# Patient Record
Sex: Male | Born: 1935 | Race: White | Hispanic: No | Marital: Married | Smoking: Former smoker
Health system: Southern US, Community
[De-identification: ages and names within clinical notes are randomized; demographics above are authoritative.]

## PROBLEM LIST (undated history)

## (undated) DIAGNOSIS — I639 Cerebral infarction, unspecified: Secondary | ICD-10-CM

## (undated) DIAGNOSIS — I5022 Chronic systolic (congestive) heart failure: Secondary | ICD-10-CM

## (undated) DIAGNOSIS — I2589 Other forms of chronic ischemic heart disease: Secondary | ICD-10-CM

## (undated) DIAGNOSIS — F17200 Nicotine dependence, unspecified, uncomplicated: Secondary | ICD-10-CM

## (undated) DIAGNOSIS — I6350 Cerebral infarction due to unspecified occlusion or stenosis of unspecified cerebral artery: Secondary | ICD-10-CM

## (undated) DIAGNOSIS — K219 Gastro-esophageal reflux disease without esophagitis: Secondary | ICD-10-CM

## (undated) DIAGNOSIS — I209 Angina pectoris, unspecified: Secondary | ICD-10-CM

## (undated) DIAGNOSIS — I2510 Atherosclerotic heart disease of native coronary artery without angina pectoris: Secondary | ICD-10-CM

## (undated) DIAGNOSIS — R0602 Shortness of breath: Secondary | ICD-10-CM

## (undated) DIAGNOSIS — F419 Anxiety disorder, unspecified: Secondary | ICD-10-CM

## (undated) DIAGNOSIS — J189 Pneumonia, unspecified organism: Secondary | ICD-10-CM

## (undated) DIAGNOSIS — I252 Old myocardial infarction: Secondary | ICD-10-CM

## (undated) DIAGNOSIS — I495 Sick sinus syndrome: Secondary | ICD-10-CM

## (undated) DIAGNOSIS — R011 Cardiac murmur, unspecified: Secondary | ICD-10-CM

## (undated) DIAGNOSIS — E785 Hyperlipidemia, unspecified: Secondary | ICD-10-CM

## (undated) DIAGNOSIS — D649 Anemia, unspecified: Secondary | ICD-10-CM

## (undated) DIAGNOSIS — I4891 Unspecified atrial fibrillation: Secondary | ICD-10-CM

## (undated) DIAGNOSIS — I447 Left bundle-branch block, unspecified: Secondary | ICD-10-CM

## (undated) DIAGNOSIS — R569 Unspecified convulsions: Secondary | ICD-10-CM

## (undated) DIAGNOSIS — J449 Chronic obstructive pulmonary disease, unspecified: Secondary | ICD-10-CM

## (undated) DIAGNOSIS — Z95810 Presence of automatic (implantable) cardiac defibrillator: Secondary | ICD-10-CM

## (undated) HISTORY — DX: Other forms of chronic ischemic heart disease: I25.89

## (undated) HISTORY — PX: CORONARY ARTERY BYPASS GRAFT: SHX141

## (undated) HISTORY — DX: Hyperlipidemia, unspecified: E78.5

## (undated) HISTORY — DX: Left bundle-branch block, unspecified: I44.7

## (undated) HISTORY — DX: Cerebral infarction due to unspecified occlusion or stenosis of unspecified cerebral artery: I63.50

## (undated) HISTORY — DX: Old myocardial infarction: I25.2

## (undated) HISTORY — DX: Gastro-esophageal reflux disease without esophagitis: K21.9

## (undated) HISTORY — DX: Sick sinus syndrome: I49.5

## (undated) HISTORY — DX: Nicotine dependence, unspecified, uncomplicated: F17.200

---

## 1996-10-11 HISTORY — PX: CARDIAC CATHETERIZATION: SHX172

## 1997-09-23 ENCOUNTER — Ambulatory Visit (HOSPITAL_COMMUNITY): Admission: RE | Admit: 1997-09-23 | Discharge: 1997-09-23 | Payer: Self-pay | Admitting: Family Medicine

## 1997-09-25 ENCOUNTER — Encounter: Payer: Self-pay | Admitting: Family Medicine

## 1997-09-25 ENCOUNTER — Ambulatory Visit (HOSPITAL_COMMUNITY): Admission: RE | Admit: 1997-09-25 | Discharge: 1997-09-25 | Payer: Self-pay | Admitting: Family Medicine

## 1997-09-26 ENCOUNTER — Encounter: Payer: Self-pay | Admitting: Cardiovascular Disease

## 1997-09-26 ENCOUNTER — Inpatient Hospital Stay (HOSPITAL_COMMUNITY): Admission: AD | Admit: 1997-09-26 | Discharge: 1997-10-01 | Payer: Self-pay | Admitting: Cardiovascular Disease

## 1999-02-25 ENCOUNTER — Ambulatory Visit (HOSPITAL_COMMUNITY): Admission: RE | Admit: 1999-02-25 | Discharge: 1999-02-25 | Payer: Self-pay | Admitting: Gastroenterology

## 2001-09-14 ENCOUNTER — Encounter: Payer: Self-pay | Admitting: Emergency Medicine

## 2001-09-14 ENCOUNTER — Emergency Department (HOSPITAL_COMMUNITY): Admission: EM | Admit: 2001-09-14 | Discharge: 2001-09-15 | Payer: Self-pay | Admitting: Emergency Medicine

## 2002-01-20 ENCOUNTER — Emergency Department (HOSPITAL_COMMUNITY): Admission: EM | Admit: 2002-01-20 | Discharge: 2002-01-21 | Payer: Self-pay | Admitting: Emergency Medicine

## 2002-01-21 ENCOUNTER — Encounter: Payer: Self-pay | Admitting: *Deleted

## 2002-01-22 ENCOUNTER — Encounter: Payer: Self-pay | Admitting: Orthopedic Surgery

## 2002-01-22 ENCOUNTER — Ambulatory Visit (HOSPITAL_COMMUNITY): Admission: RE | Admit: 2002-01-22 | Discharge: 2002-01-22 | Payer: Self-pay | Admitting: Orthopedic Surgery

## 2003-12-23 ENCOUNTER — Ambulatory Visit: Payer: Self-pay | Admitting: Family Medicine

## 2004-01-21 ENCOUNTER — Ambulatory Visit: Payer: Self-pay | Admitting: Family Medicine

## 2004-03-12 ENCOUNTER — Emergency Department (HOSPITAL_COMMUNITY): Admission: EM | Admit: 2004-03-12 | Discharge: 2004-03-12 | Payer: Self-pay | Admitting: *Deleted

## 2004-03-12 ENCOUNTER — Ambulatory Visit: Payer: Self-pay | Admitting: Family Medicine

## 2004-03-16 ENCOUNTER — Ambulatory Visit: Payer: Self-pay | Admitting: Family Medicine

## 2004-04-13 ENCOUNTER — Ambulatory Visit: Payer: Self-pay | Admitting: Family Medicine

## 2004-07-20 ENCOUNTER — Ambulatory Visit: Payer: Self-pay | Admitting: Family Medicine

## 2004-10-27 ENCOUNTER — Ambulatory Visit: Payer: Self-pay | Admitting: Family Medicine

## 2004-11-24 ENCOUNTER — Ambulatory Visit: Payer: Self-pay | Admitting: Family Medicine

## 2004-12-18 ENCOUNTER — Ambulatory Visit: Payer: Self-pay | Admitting: Family Medicine

## 2005-03-29 ENCOUNTER — Ambulatory Visit: Payer: Self-pay | Admitting: Family Medicine

## 2005-05-03 ENCOUNTER — Ambulatory Visit: Payer: Self-pay | Admitting: Family Medicine

## 2005-05-03 ENCOUNTER — Inpatient Hospital Stay (HOSPITAL_COMMUNITY): Admission: EM | Admit: 2005-05-03 | Discharge: 2005-05-05 | Payer: Self-pay | Admitting: Emergency Medicine

## 2005-05-10 ENCOUNTER — Ambulatory Visit: Payer: Self-pay | Admitting: Family Medicine

## 2005-05-31 ENCOUNTER — Ambulatory Visit: Payer: Self-pay | Admitting: Family Medicine

## 2005-06-01 HISTORY — PX: CARDIOVASCULAR STRESS TEST: SHX262

## 2005-07-19 ENCOUNTER — Ambulatory Visit: Payer: Self-pay | Admitting: Family Medicine

## 2005-09-07 ENCOUNTER — Ambulatory Visit: Payer: Self-pay | Admitting: Family Medicine

## 2005-10-27 ENCOUNTER — Ambulatory Visit: Payer: Self-pay | Admitting: Family Medicine

## 2005-11-26 ENCOUNTER — Ambulatory Visit: Payer: Self-pay | Admitting: Family Medicine

## 2006-01-13 ENCOUNTER — Ambulatory Visit: Payer: Self-pay | Admitting: Family Medicine

## 2006-01-25 HISTORY — PX: CARDIAC DEFIBRILLATOR PLACEMENT: SHX171

## 2006-01-28 ENCOUNTER — Encounter (INDEPENDENT_AMBULATORY_CARE_PROVIDER_SITE_OTHER): Payer: Self-pay | Admitting: Cardiology

## 2006-01-28 ENCOUNTER — Inpatient Hospital Stay (HOSPITAL_COMMUNITY): Admission: EM | Admit: 2006-01-28 | Discharge: 2006-01-30 | Payer: Self-pay | Admitting: Emergency Medicine

## 2006-02-07 ENCOUNTER — Ambulatory Visit: Payer: Self-pay | Admitting: Family Medicine

## 2006-02-17 ENCOUNTER — Ambulatory Visit: Payer: Self-pay | Admitting: Family Medicine

## 2006-03-18 ENCOUNTER — Ambulatory Visit: Payer: Self-pay | Admitting: Family Medicine

## 2006-03-23 ENCOUNTER — Ambulatory Visit: Payer: Self-pay | Admitting: Internal Medicine

## 2006-03-24 HISTORY — PX: US ECHOCARDIOGRAPHY: HXRAD669

## 2006-03-28 ENCOUNTER — Ambulatory Visit: Payer: Self-pay | Admitting: Family Medicine

## 2006-04-07 ENCOUNTER — Ambulatory Visit: Payer: Self-pay | Admitting: Internal Medicine

## 2006-04-07 LAB — CONVERTED CEMR LAB
BUN: 21 mg/dL (ref 6–23)
Chloride: 101 meq/L (ref 96–112)
GFR calc Af Amer: 95 mL/min
GFR calc non Af Amer: 78 mL/min
Glucose, Bld: 76 mg/dL (ref 70–99)
HCT: 38.3 % — ABNORMAL LOW (ref 39.0–52.0)
INR: 3.2 — ABNORMAL HIGH (ref 0.9–2.0)
Lymphocytes Relative: 28.9 % (ref 12.0–46.0)
MCV: 81.8 fL (ref 78.0–100.0)
Monocytes Relative: 9.9 % (ref 3.0–11.0)
Neutrophils Relative %: 57.6 % (ref 43.0–77.0)
Potassium: 4 meq/L (ref 3.5–5.1)
Prothrombin Time: 23.1 s — ABNORMAL HIGH (ref 10.0–14.0)
RBC: 4.68 M/uL (ref 4.22–5.81)
RDW: 29.8 % — ABNORMAL HIGH (ref 11.5–14.6)
Sodium: 140 meq/L (ref 135–145)

## 2006-04-14 ENCOUNTER — Inpatient Hospital Stay (HOSPITAL_COMMUNITY): Admission: AD | Admit: 2006-04-14 | Discharge: 2006-04-15 | Payer: Self-pay | Admitting: Internal Medicine

## 2006-04-14 ENCOUNTER — Ambulatory Visit: Payer: Self-pay | Admitting: Internal Medicine

## 2006-04-28 ENCOUNTER — Ambulatory Visit: Payer: Self-pay | Admitting: Family Medicine

## 2006-05-02 ENCOUNTER — Ambulatory Visit: Payer: Self-pay

## 2006-06-02 ENCOUNTER — Ambulatory Visit: Payer: Self-pay | Admitting: Internal Medicine

## 2006-08-02 ENCOUNTER — Ambulatory Visit: Payer: Self-pay | Admitting: Internal Medicine

## 2006-09-05 ENCOUNTER — Ambulatory Visit: Payer: Self-pay | Admitting: Internal Medicine

## 2006-10-28 ENCOUNTER — Inpatient Hospital Stay (HOSPITAL_COMMUNITY): Admission: EM | Admit: 2006-10-28 | Discharge: 2006-10-31 | Payer: Self-pay | Admitting: Emergency Medicine

## 2006-10-28 ENCOUNTER — Ambulatory Visit: Payer: Self-pay | Admitting: Internal Medicine

## 2006-10-31 ENCOUNTER — Encounter (INDEPENDENT_AMBULATORY_CARE_PROVIDER_SITE_OTHER): Payer: Self-pay | Admitting: Internal Medicine

## 2006-10-31 ENCOUNTER — Ambulatory Visit: Payer: Self-pay | Admitting: Vascular Surgery

## 2007-04-18 ENCOUNTER — Ambulatory Visit: Payer: Self-pay | Admitting: Internal Medicine

## 2007-04-25 ENCOUNTER — Ambulatory Visit: Payer: Self-pay | Admitting: Vascular Surgery

## 2007-07-18 ENCOUNTER — Ambulatory Visit: Payer: Self-pay | Admitting: Internal Medicine

## 2007-10-18 ENCOUNTER — Ambulatory Visit: Payer: Self-pay | Admitting: Internal Medicine

## 2008-01-16 ENCOUNTER — Ambulatory Visit: Payer: Self-pay | Admitting: Internal Medicine

## 2008-04-10 ENCOUNTER — Encounter: Payer: Self-pay | Admitting: Internal Medicine

## 2008-04-15 DIAGNOSIS — I495 Sick sinus syndrome: Secondary | ICD-10-CM

## 2008-04-15 DIAGNOSIS — I6350 Cerebral infarction due to unspecified occlusion or stenosis of unspecified cerebral artery: Secondary | ICD-10-CM

## 2008-04-15 DIAGNOSIS — I2510 Atherosclerotic heart disease of native coronary artery without angina pectoris: Secondary | ICD-10-CM | POA: Insufficient documentation

## 2008-04-15 DIAGNOSIS — F17200 Nicotine dependence, unspecified, uncomplicated: Secondary | ICD-10-CM | POA: Insufficient documentation

## 2008-04-15 DIAGNOSIS — R569 Unspecified convulsions: Secondary | ICD-10-CM | POA: Insufficient documentation

## 2008-04-15 DIAGNOSIS — R5381 Other malaise: Secondary | ICD-10-CM

## 2008-04-15 DIAGNOSIS — R5383 Other fatigue: Secondary | ICD-10-CM

## 2008-04-15 DIAGNOSIS — I5023 Acute on chronic systolic (congestive) heart failure: Secondary | ICD-10-CM | POA: Insufficient documentation

## 2008-04-15 DIAGNOSIS — K219 Gastro-esophageal reflux disease without esophagitis: Secondary | ICD-10-CM

## 2008-04-15 DIAGNOSIS — E785 Hyperlipidemia, unspecified: Secondary | ICD-10-CM | POA: Insufficient documentation

## 2008-04-15 DIAGNOSIS — I2589 Other forms of chronic ischemic heart disease: Secondary | ICD-10-CM | POA: Insufficient documentation

## 2008-04-15 HISTORY — DX: Cerebral infarction due to unspecified occlusion or stenosis of unspecified cerebral artery: I63.50

## 2008-04-16 ENCOUNTER — Encounter: Payer: Self-pay | Admitting: Internal Medicine

## 2008-04-16 ENCOUNTER — Ambulatory Visit: Payer: Self-pay | Admitting: Internal Medicine

## 2008-04-22 ENCOUNTER — Ambulatory Visit: Payer: Self-pay

## 2008-07-23 ENCOUNTER — Ambulatory Visit: Payer: Self-pay | Admitting: Internal Medicine

## 2008-07-29 ENCOUNTER — Encounter: Payer: Self-pay | Admitting: Internal Medicine

## 2008-10-10 ENCOUNTER — Emergency Department (HOSPITAL_COMMUNITY): Admission: EM | Admit: 2008-10-10 | Discharge: 2008-10-11 | Payer: Self-pay | Admitting: Emergency Medicine

## 2008-10-24 ENCOUNTER — Encounter: Payer: Self-pay | Admitting: Internal Medicine

## 2008-10-25 ENCOUNTER — Encounter: Payer: Self-pay | Admitting: Internal Medicine

## 2008-10-31 ENCOUNTER — Encounter: Payer: Self-pay | Admitting: Internal Medicine

## 2009-01-28 ENCOUNTER — Ambulatory Visit: Payer: Self-pay | Admitting: Internal Medicine

## 2009-02-13 ENCOUNTER — Encounter: Payer: Self-pay | Admitting: Internal Medicine

## 2009-03-31 ENCOUNTER — Ambulatory Visit: Payer: Self-pay | Admitting: Internal Medicine

## 2009-03-31 DIAGNOSIS — Z95810 Presence of automatic (implantable) cardiac defibrillator: Secondary | ICD-10-CM | POA: Insufficient documentation

## 2009-07-24 ENCOUNTER — Ambulatory Visit: Payer: Self-pay | Admitting: Internal Medicine

## 2009-08-05 ENCOUNTER — Encounter: Payer: Self-pay | Admitting: Internal Medicine

## 2009-10-30 ENCOUNTER — Encounter: Payer: Self-pay | Admitting: Internal Medicine

## 2009-11-03 ENCOUNTER — Ambulatory Visit: Payer: Self-pay | Admitting: Internal Medicine

## 2009-11-12 ENCOUNTER — Encounter: Payer: Self-pay | Admitting: Internal Medicine

## 2010-02-05 ENCOUNTER — Ambulatory Visit: Admit: 2010-02-05 | Payer: Self-pay | Admitting: Internal Medicine

## 2010-02-06 ENCOUNTER — Encounter (INDEPENDENT_AMBULATORY_CARE_PROVIDER_SITE_OTHER): Payer: Self-pay | Admitting: *Deleted

## 2010-02-26 NOTE — Letter (Signed)
Summary: Remote Device Check  [employer], Main Office  [address]   [place], [place] [address]   Phone: [phone]  Fax: [phone]     August 05, 2009 MRN: [id number]   [name] 2005 [address] [place], [place]  [address]   Dear Mr. Lanius,   Your remote transmission was recieved and reviewed by your physician.  All diagnostics were within normal limits for you.  __X___Your next transmission is scheduled for:  10-23-2009.  Please transmit at any time this day.  If you have a wireless device your transmission will be sent automatically.    Sincerely,  Vella Kohler

## 2010-02-26 NOTE — Letter (Signed)
Summary: Device-Delinquent Phone Journalist, newspaper, Main Office  [address]   [place], [place] [address]   Phone: [phone]  Fax: [phone]     October 30, 2009 MRN: [id number]   [name] 2005 [address] [place], [place]  [address]   Dear Mr. Goldblatt,  According to our records, you were scheduled for a device phone transmission on 10-23-09.     We did not receive any results from this check.  If you transmitted on your scheduled day, please call us to help troubleshoot your system.  If you forgot to send your transmission, please send one upon receipt of this letter.  Thank you,   Architectural [facility]

## 2010-02-26 NOTE — Letter (Signed)
Summary: Remote Device Check  [employer], Main Office  [address]   [place], [place] [address]   Phone: [phone]  Fax: [phone]     November 12, 2009 MRN: [id number]   [name] 2005 [address] [place], [place]  [address]   Dear [name],   Your remote transmission was recieved and reviewed by your physician.  All diagnostics were within normal limits for you.  __X___Your next transmission is scheduled for:  02-05-2010.  Please transmit at any time this day.  If you have a wireless device your transmission will be sent automatically.   Sincerely,  Vella Kohler

## 2010-02-26 NOTE — Letter (Signed)
Summary: Device-Delinquent Phone Journalist, newspaper, Main Office  [address]   [place], [place] [address]   Phone: [phone]  Fax: [phone]     February 06, 2010 MRN: [id number]   [name] 2005 [address] [place], [place]  [address]   Dear Mr. Rolfson,  According to our records, you were scheduled for a device phone transmission on 02-05-2010.     We did not receive any results from this check.  If you transmitted on your scheduled day, please call us to help troubleshoot your system.  If you forgot to send your transmission, please send one upon receipt of this letter.  Thank you,   Architectural [facility]

## 2010-02-26 NOTE — Cardiovascular Report (Signed)
Summary: Office Visit Remote   Office Visit Remote   Imported By: Roderic Ovens 02/19/2009 12:24:53  _____________________________________________________________________  External Attachment:    Type:   Image     Comment:   External Document

## 2010-02-26 NOTE — Assessment & Plan Note (Signed)
Summary: 1 YR F/U   Visit Type:  Follow-up Primary Provider:  Lysbeth Galas, MD  CC:  headache.  History of Present Illness: Mr. Diebold returns today for followup.  He is a 77 yo man with a h/o an ICM, CHF, HTN, and peripheral vascular disease.  He has tried to stop smoking and notes that he can get down to a half pack a day but becomes anxious and increases his consumption.  He underwent implantation of a Medtronic DDD ICD approximately 3 yrs ago and has had no intercurrent ICD therapies.  No c/p, peripheral edema, or nausea and vomiting.  Problems Prior to Update: 1)  Tobacco User  (ICD-305.1) 2)  Gerd  (ICD-530.81) 3)  Cva  (ICD-434.91) 4)  Dyslipidemia  (ICD-272.4) 5)  Seizure Disorder  (ICD-780.39) 6)  Coronary Artery Bypass Graft, Hx of  (ICD-V45.81) 7)  Weakness  (ICD-780.79) 8)  Fatigue / Malaise  (ICD-780.79) 9)  Pacemaker  (ICD-V45.Marland Kitchen01) 10)  Sinus Bradycardia  (ICD-427.81) 11)  Congestive Heart Failure, Unspecified  (ICD-428.0) 12)  Cardiomyopathy, Ischemic  (ICD-414.8)  Current Medications (verified): 1)  Furosemide 40 Mg Tabs (Furosemide) .... Take One Tablet By Mouth Two Times A Day 2)  Phenytoin Sodium Extended 100 Mg Caps (Phenytoin Sodium Extended) .Marland Kitchen.. 1 Tab Morning, 1 Tab Noon, 2 Tabs Night 3)  Phenobarbital 97.2 Mg Tabs (Phenobarbital) .... Take 1/2 in Am and 1 in Pm 4)  Warfarin 5mg  .... As Directed 5)  Enalapril Maleate 2.5 Mg Tabs (Enalapril Maleate) .... Take One Tablet By Mouth Once Daily 6)  Digoxin 0.25 Mg Tabs (Digoxin) .... Take One Tablet By Mouth Daily 7)  Potassium Chloride Cr 10 Meq Cr-Caps (Potassium Chloride) .... Take Two Tablet By Mouth Daily 8)  Carvedilol 12.5 Mg Tabs (Carvedilol) .... Take One Tablet By Mouth Twice A Day 9)  Aspirin Ec 325 Mg Tbec (Aspirin) .... Take One Tablet By Mouth Daily 10)  Prilosec 20 Mg Cpdr (Omeprazole) .... Take One Tablet By Mouth Once Daily. 11)  Simvastatin 80 Mg Tabs (Simvastatin) .... Take One Tablet By Mouth Daily  At Bedtime 12)  Ferrous Sulfate .... Take One Tablet By Mouth Once Daily.  Allergies: 1)  ! Diovan  Past History:  Past Medical History: Last updated: 04/15/2008  TOBACCO USER (ICD-305.1) GERD (ICD-530.81) CVA (ICD-434.91) DYSLIPIDEMIA (ICD-272.4) SEIZURE DISORDER (ICD-780.39) CORONARY ARTERY BYPASS GRAFT, HX OF (ICD-V45.81) WEAKNESS (ICD-780.79) FATIGUE / MALAISE (ICD-780.79) PACEMAKER (ICD-V45.Marland Kitchen01) SINUS BRADYCARDIA (ICD-427.81) CONGESTIVE HEART FAILURE, UNSPECIFIED (ICD-428.0) CARDIOMYOPATHY, ISCHEMIC (ICD-414.8)  Review of Systems  The patient denies chest pain, syncope, dyspnea on exertion, and peripheral edema.    Vital Signs:  Patient profile:   77 year old male Height:      69 inches Weight:      158 pounds BMI:     23.42 O2 Sat:      97 % Pulse rate:   70 / minute BP sitting:   112 / 60  (left arm)  Vitals Entered By: Laurance Flatten CMA (March 31, 2009 3:42 PM)  Physical Exam  General:  Elderly, well developed, well nourished, in no acute distress.  HEENT: normal Neck: supple. No JVD. Carotids 1+ bilaterally no bruits Cor: RRR no rubs, gallops or murmur. PMI is enlarged and laterally displaced. Lungs: CTA Ab: soft, nontender. nondistended. No HSM. Good bowel sounds Ext: warm. no cyanosis, clubbing or edema Neuro: alert and oriented. Grossly nonfocal. affect pleasant     ICD Specifications Following MD:  Lewayne Bunting, MD  ICD Vendor:  Medtronic     ICD Model Number:  7278     ICD Serial Number:  [id number] H ICD DOI:  04/14/2006      Lead 1:    Location: RA     DOI: 04/14/2006     Model #: 2025     Serial #: [id number]     Status: active Lead 2:    Location: RV     DOI: 04/14/2006     Model #: 8315     Serial #: [id number]     Status: active  Indications::  ICM  Explantation Comments: [facility]  ICD Follow Up Remote Check?  No Battery Voltage:  3.07 V     Charge Time:  8.79 seconds     Underlying rhythm:  SB ICD Dependent:  No       ICD Device  Measurements Atrium:  Amplitude: 2.3 mV, Impedance: 480 ohms, Threshold: 1.0 V at 0.4 msec Right Ventricle:  Amplitude: 16.3 mV, Impedance: 744 ohms, Threshold: 1.0 V at 0.4 msec Shock Impedance: 46/60 ohms   Episodes MS Episodes:  41     Coumadin:  Yes Shock:  0     ATP:  0     Nonsustained:  0     Atrial Pacing:  92.3%     Ventricular Pacing:  14.3%  Brady Parameters Mode DDDR     Lower Rate Limit:  70     Upper Rate Limit 120 PAV 350     Sensed AV Delay:  300  Tachy Zones VF:  200     VT:  250 FVT VIA VF     VT1:  171     Next Remote Date:  07/01/2009     Next Cardiology Appt Due:  03/26/2010 Tech Comments:  Normal device function.  No changes made today.  Pt on Coumadin therapy.  Pt does [facility] transmissions.  ROV 12 months GT. Gypsy Balsam RN BSN  March 31, 2009 4:02 PM  MD Comments:  Agree with above.  Impression & Recommendations:  Problem # 1:  CARDIOMYOPATHY, ISCHEMIC (ICD-414.8) He denies anginal symptoms.  Continue meds as noted below. His updated medication list for this problem includes:    Furosemide 40 Mg Tabs (Furosemide) .Marland Kitchen... Take one tablet by mouth two times a day    Enalapril Maleate 2.5 Mg Tabs (Enalapril maleate) .Marland Kitchen... Take one tablet by mouth once daily    Digoxin 0.25 Mg Tabs (Digoxin) .Marland Kitchen... Take one tablet by mouth daily    Carvedilol 12.5 Mg Tabs (Carvedilol) .Marland Kitchen... Take one tablet by mouth twice a day    Aspirin Ec 325 Mg Tbec (Aspirin) .Marland Kitchen... Take one tablet by mouth daily  Problem # 2:  AUTOMATIC IMPLANTABLE CARDIAC DEFIBRILLATOR SITU (ICD-V45.02) His device is working normally.  No intercurrent ICD therapies.  Problem # 3:  TOBACCO USER (ICD-305.1) I continue to attempt to encourage him to stop smoking. He is going to try.  Patient Instructions: 1)  Your physician recommends that you schedule a follow-up appointment in: 12 months with Dr [name]

## 2010-02-26 NOTE — Cardiovascular Report (Signed)
Summary: Office Visit Remote   Office Visit Remote   Imported By: Roderic Ovens 08/06/2009 12:08:52  _____________________________________________________________________  External Attachment:    Type:   Image     Comment:   External Document

## 2010-02-26 NOTE — Letter (Signed)
Summary: Remote Device Check  [employer], Main Office  [address]   [place], [place] [address]   Phone: [phone]  Fax: [phone]     February 13, 2009 MRN: [id number]   ROPER TOLSON 2005 [address] [place], [place]  [address]   Dear Mr. Brine,   Your remote transmission was recieved and reviewed by your physician.  All diagnostics were within normal limits for you.    ___X___Your next office visit is scheduled for:   MARCH 2011 WITH DR [name]. Please call our office to schedule an appointment.    Sincerely,  Proofreader

## 2010-02-26 NOTE — Cardiovascular Report (Signed)
Summary: Office Visit Remote   Office Visit Remote   Imported By: Roderic Ovens 11/13/2009 13:29:01  _____________________________________________________________________  External Attachment:    Type:   Image     Comment:   External Document

## 2010-04-07 ENCOUNTER — Encounter (INDEPENDENT_AMBULATORY_CARE_PROVIDER_SITE_OTHER): Payer: Medicare PPO | Admitting: Internal Medicine

## 2010-04-07 ENCOUNTER — Encounter: Payer: Self-pay | Admitting: Internal Medicine

## 2010-04-07 DIAGNOSIS — I5022 Chronic systolic (congestive) heart failure: Secondary | ICD-10-CM

## 2010-04-07 DIAGNOSIS — Z95810 Presence of automatic (implantable) cardiac defibrillator: Secondary | ICD-10-CM

## 2010-04-07 DIAGNOSIS — I2589 Other forms of chronic ischemic heart disease: Secondary | ICD-10-CM

## 2010-04-07 DIAGNOSIS — F17200 Nicotine dependence, unspecified, uncomplicated: Secondary | ICD-10-CM

## 2010-04-07 NOTE — Assessment & Plan Note (Signed)
He denies anginal symptoms. Will continue his current meds.  

## 2010-04-07 NOTE — Assessment & Plan Note (Signed)
I have discussed the importance of stopping smoking and have asked him to try a low dose of nicotine replacement and to reduce his tobacco usage to less than a half pack a day.

## 2010-04-07 NOTE — Assessment & Plan Note (Signed)
His device is working normally. Will recheck in several months. 

## 2010-04-07 NOTE — Progress Notes (Signed)
HPI Mr. Pounders returns today for followup. He is a 77 yo man with a h/o an ICM, CHF, HTN, and peripheral vascular disease. He has tried to stop smoking and notes that he can get down to a half pack a day but becomes anxious and increases his consumption. He underwent implantation of a Medtronic DDD ICD approximately 4 yrs ago and has had no intercurrent ICD therapies. No c/p, peripheral edema, or nausea and vomiting.  Allergies  Allergen Reactions  . Valsartan      Current Outpatient Prescriptions  Medication Sig Dispense Refill  . aspirin 325 MG tablet Take 325 mg by mouth daily.        . carvedilol (COREG) 12.5 MG tablet Take 12.5 mg by mouth 2 (two) times daily with a meal.        . digoxin (LANOXIN) 0.25 MG tablet Take 250 mcg by mouth daily.        . enalapril (VASOTEC) 2.5 MG tablet Take 2.5 mg by mouth daily.        . furosemide (LASIX) 40 MG tablet Take 40 mg by mouth 2 (two) times daily.        Marland Kitchen omeprazole (PRILOSEC OTC) 20 MG tablet Take 20 mg by mouth daily.        Marland Kitchen PHENobarbital (LUMINAL) 97.2 MG tablet Take 97.2 mg by mouth as directed. 1/2 tab in AM 1 tab in PM       . phenytoin (DILANTIN) 100 MG ER capsule Take by mouth as directed. 1 tab in morning 1 tab at noon 2 tabs at night       . potassium chloride (KLOR-CON) 10 MEQ CR tablet Take 10 mEq by mouth daily.        . simvastatin (ZOCOR) 80 MG tablet Take 80 mg by mouth at bedtime.        Marland Kitchen warfarin (COUMADIN) 5 MG tablet Take 5 mg by mouth as directed.           Past Medical History  Diagnosis Date  . Tobacco use disorder   . Esophageal reflux   . Unspecified cerebral artery occlusion with cerebral infarction   . Other and unspecified hyperlipidemia   . Other convulsions   . Postsurgical aortocoronary bypass status   . Other malaise and fatigue   . Cardiac pacemaker in situ   . Sinoatrial node dysfunction   . Congestive heart failure, unspecified   . Other specified forms of chronic ischemic heart disease       History reviewed. No pertinent past surgical history.   Family History  Problem Relation Age of Onset  . Heart disease Mother   . Heart disease Father      History   Social History  . Marital Status: Married    Spouse Name: N/A    Number of Children: N/A  . Years of Education: N/A   Occupational History  . Not on file.   Social History Main Topics  . Smoking status: Current Everyday Smoker -- 1.0 packs/day for 40 years  . Smokeless tobacco: Not on file   Comment: Started on nicotine patch  . Alcohol Use: No  . Drug Use: No  . Sexually Active: Not on file   Other Topics Concern  . Not on file   Social History Narrative  . No narrative on file    As stated in the HPI and negative for all other systems.   BP 90/60  Pulse 64  Ht 5\' 9"  (1.753  m)  Wt 154 lb (69.854 kg)  BMI 22.74 kg/m2  Well appearing NAD HEENT: Unremarkable Neck:  No JVD, no thyromegally Lymphatics:  No adenopathy Back:  No CVA tenderness Lungs:  Clear HEART:  Regular rate rhythm, no murmurs, no rubs, no clicks Abd:  Flat, positive bowel sounds, no organomegally, no rebound, no guarding Ext:  2 plus pulses, no edema, no cyanosis, no clubbing Skin:  No rashes no nodules Neuro:  CN II through XII intact, motor grossly intact  DEVICE  Normal device function.  See PaceArt for details.

## 2010-04-14 NOTE — Assessment & Plan Note (Signed)
Summary: 1 year rov/icd ck   Visit Type:  Follow-up Primary Provider:  Lysbeth Galas, MD   History of Present Illness: THis note was done in EPIC.  Current Medications (verified): 1)  Furosemide 40 Mg Tabs (Furosemide) .... Take One Tablet By Mouth Two Times A Day 2)  Phenytoin Sodium Extended 100 Mg Caps (Phenytoin Sodium Extended) .Marland Kitchen.. 1 Tab Morning, 1 Tab Noon, 2 Tabs Night 3)  Phenobarbital 97.2 Mg Tabs (Phenobarbital) .... Take 1/2 in Am and 1 in Pm 4)  Warfarin 5mg  .... As Directed 5)  Enalapril Maleate 2.5 Mg Tabs (Enalapril Maleate) .... Take One Tablet By Mouth Once Daily 6)  Digoxin 0.25 Mg Tabs (Digoxin) .... Take One Tablet By Mouth Daily 7)  Potassium Chloride Cr 10 Meq Cr-Caps (Potassium Chloride) .... Take Two Tablet By Mouth Daily 8)  Carvedilol 12.5 Mg Tabs (Carvedilol) .... Take One Tablet By Mouth Twice A Day 9)  Aspirin Ec 325 Mg Tbec (Aspirin) .... Take One Tablet By Mouth Daily 10)  Prilosec 20 Mg Cpdr (Omeprazole) .... Take One Tablet By Mouth Once Daily. 11)  Simvastatin 80 Mg Tabs (Simvastatin) .... Take One Tablet By Mouth Daily At Bedtime 12)  Ferrous Sulfate .... Take One Tablet By Mouth Once Daily.  Allergies: 1)  ! Diovan  Past History:  Past Medical History: Last updated: 04/15/2008  TOBACCO USER (ICD-305.1) GERD (ICD-530.81) CVA (ICD-434.91) DYSLIPIDEMIA (ICD-272.4) SEIZURE DISORDER (ICD-780.39) CORONARY ARTERY BYPASS GRAFT, HX OF (ICD-V45.81) WEAKNESS (ICD-780.79) FATIGUE / MALAISE (ICD-780.79) PACEMAKER (ICD-V45.Marland Kitchen01) SINUS BRADYCARDIA (ICD-427.81) CONGESTIVE HEART FAILURE, UNSPECIFIED (ICD-428.0) CARDIOMYOPATHY, ISCHEMIC (ICD-414.8)  Vital Signs:  Patient profile:   77 year old male Height:      69 inches Weight:      154 pounds BMI:     22.82 Pulse rate:   64 / minute BP sitting:   90 / 60  (left arm)  Vitals Entered By: Laurance Flatten CMA (April 07, 2010 4:39 PM)    ICD Specifications Following MD:  Lewayne Bunting, MD     ICD Vendor:   Medtronic     ICD Model Number:  7278     ICD Serial Number:  [id number] H ICD DOI:  04/14/2006      Lead 1:    Location: RA     DOI: 04/14/2006     Model #: 5076     Serial #: [id number]     Status: active Lead 2:    Location: RV     DOI: 04/14/2006     Model #: 8295     Serial #: [id number]     Status: active  Indications::  ICM  Explantation Comments: [facility]  ICD Follow Up ICD Dependent:  No      Episodes Coumadin:  Yes  Brady Parameters Mode DDDR     Lower Rate Limit:  70     Upper Rate Limit 120 PAV 350     Sensed AV Delay:  300  Tachy Zones VF:  200     VT:  250 FVT VIA VF     VT1:  171     Patient Instructions: 1)  Your physician wants you to follow-up in: 9 months with Dr Stevan Born will receive a reminder letter in the mail two months in advance. If you don't receive a letter, please call our office to schedule the follow-up appointment. 2)  Your physician recommends that you continue on your current medications as directed. Please refer to the Current Medication list given to you today.

## 2010-04-14 NOTE — Cardiovascular Report (Signed)
Summary: Office Visit   Office Visit   Imported By: Roderic Ovens 04/08/2010 11:43:43  _____________________________________________________________________  External Attachment:    Type:   Image     Comment:   External Document

## 2010-05-01 LAB — CBC
MCHC: 34.1 g/dL (ref 30.0–36.0)
Platelets: 164 10*3/uL (ref 150–400)
RBC: 4.47 MIL/uL (ref 4.22–5.81)
RDW: 21.9 % — ABNORMAL HIGH (ref 11.5–15.5)
WBC: 9.8 10*3/uL (ref 4.0–10.5)

## 2010-05-01 LAB — URINE CULTURE: Colony Count: 10000

## 2010-05-01 LAB — URINALYSIS, ROUTINE W REFLEX MICROSCOPIC
Bilirubin Urine: NEGATIVE
Glucose, UA: NEGATIVE mg/dL
Ketones, ur: NEGATIVE mg/dL
Protein, ur: 30 mg/dL — AB
Urobilinogen, UA: 0.2 mg/dL (ref 0.0–1.0)

## 2010-05-01 LAB — BASIC METABOLIC PANEL
CO2: 30 mEq/L (ref 19–32)
GFR calc non Af Amer: >60 mL/min (ref >60)
Glucose, Bld: 104 mg/dL — ABNORMAL HIGH (ref 70–99)
Sodium: 137 mEq/L (ref 135–145)

## 2010-05-01 LAB — PROTIME-INR
INR: 2.7 — ABNORMAL HIGH (ref 0.00–1.49)
Prothrombin Time: 28.8 seconds — ABNORMAL HIGH (ref 11.6–15.2)

## 2010-05-01 LAB — DIFFERENTIAL
Basophils Absolute: 0 10*3/uL (ref 0.0–0.1)
Eosinophils Absolute: 0.2 10*3/uL (ref 0.0–0.7)
Eosinophils Relative: 2 % (ref 0–5)
Lymphocytes Relative: 26 % (ref 12–46)
Lymphs Abs: 2.5 10*3/uL (ref 0.7–4.0)
Neutrophils Relative %: 63 % (ref 43–77)

## 2010-05-01 LAB — URINE MICROSCOPIC-ADD ON

## 2010-06-09 NOTE — Consult Note (Signed)
VASCULAR SURGERY CONSULTATION   [name], [name]  DOB:  12/24/1933                                       04/25/2007  BJYNW#:[id number]   I saw the patient in the office today in consultation concerning some  left leg pain.  He was referred by Dr. Joette Catching.  This is a  pleasant 77 year old gentleman who approximately 6 weeks ago began  noticing some pain in the left leg from the knee to the foot along the  anterior aspect of the leg.  He states that the leg hurt for about 3  days, and then he developed a swelling and throbbing pain, which has  persisted although it has improved significantly.  He also developed  some erythema, which was treated with antibiotics, and this helped  significantly.  There are no aggravating factors.  Elevation and  ambulation seems to alleviate his symptoms.  He is not aware of any  history of DVT or phlebitis.  He gives no history of claudication, rest  pain, or nonhealing ulcers.   Of note, he has undergone previous open heart surgery with vein taken  from the left leg.   His past medical history is significant for hypercholesterolemia,  history of a myocardial infarction in 1989, history of congestive heart  failure, and a history of stroke in 54.  He denies any history of  diabetes or hypertension.   On family history, there is no history of premature cardiovascular  disease.   SOCIAL HISTORY:  He is married.  He is retired.  He smokes a pack per  day of cigarettes and has been smoking for as long as he can remember.   REVIEW OF SYSTEMS AND MEDICATIONS:  Documented on the medical history  form in his chart.  Of note, he is on Coumadin for a history of atrial  fibrillation.   PHYSICAL EXAMINATION:  This is a pleasant 77 year old gentleman who  appears his stated age.  Blood pressure is 107/69, heart rate is 73.  HEENT:  Extraocular motions intact.  His neck is supple.  There is no  cervical lymphadenopathy.  I did not  detect any carotid bruits.  Lungs  are clear bilaterally to auscultation.  On cardiac exam, he has a  regular rate and rhythm.  His abdomen is soft and nontender.  He has  palpable femoral, popliteal, and pedal pulses.  He has no significant  lower extremity swelling currently.  He has no erythema or open ulcers.   He did have a Doppler study in our office today, which shows triphasic  wave forms in both feet with ABI greater than 100% bilaterally.  Venous  duplex scan shows no evidence of DVT in the left lower extremity.  Based  on his history, I suspect he had some lymphedema related to his previous  vein harvest in the left leg and probably developed lymphangitis.  This  was appropriately treated with antibiotics, and as the cellulitis  resolved, I think his symptoms have improved significantly.  I would not  recommend further workup at this time.  I have reassured him he has no  evidence of DVT and no evidence of arterial insufficiency.  I have  explained that lymphedema is a chronic problem and that this is treated  with elevation and compression therapy.  I would be happy to see him  back at any time if any new vascular issues arise.   Di Kindle. Edilia Bo, M.D.  Electronically Signed  CSD/MEDQ  D:  04/25/2007  T:  04/26/2007  Job:  [id number]   cc:   [name], M.D.

## 2010-06-09 NOTE — Assessment & Plan Note (Signed)
[facility]                         ELECTROPHYSIOLOGY OFFICE NOTE   CLARK, CUFF                        MRN:          [id number]  DATE:04/16/2008                            DOB:          [birth year]    Mr. Grundman returns today for followup.  He is a very pleasant male with  a history of congestive heart failure, ischemic cardiomyopathy, sinus  bradycardia, and severe weakness and fatigue in his legs.  The patient  returns today for followup.  His main complaint continues to be weakness  in his legs and difficulty with walking.  This is not so much related to  inability to breathe as much as it is just plain old fatigue.  He does  have a history of lower back pain and back problems.   MEDICATIONS:  1. Furosemide 40 mg twice daily.  2. Dilantin 100 mg twice daily.  3. Phenobarbitone daily.  4. Warfarin 5 mg as directed.  5. Enalapril 2.5 daily.  6. Digoxin 0.25 daily.  7. Potassium 10 mEq 2 tablets daily.  8. Nexium 40 a day.  9. He is on carvedilol 6.25 twice daily.  10.Advair 250 mcg twice daily.   PHYSICAL EXAMINATION:  GENERAL:  He is a pleasant chronically ill-  appearing man in no distress.  He looks a little bit older than stated  age.  VITAL SIGNS:  Blood pressure was 87/55, the pulse 69 and regular,  respirations were 18.  His weight was 165 pounds.  NECK:  No jugular venous distention.  LUNGS:  Scattered wheezes and rales bilaterally.  There is no increased  work of breathing.  CARDIOVASCULAR:  Regular rate and rhythm.  Normal S1 and S2.  PMI was  enlarged and laterally displaced.  ABDOMEN:  Soft, nontender.  EXTREMITIES:  No obvious edema.  His pulses appear to be intact.   Interrogation of his defibrillator demonstrates Medtronic Maximo dual-  chamber device.  P-waves were 3 and R waves were 16.  The impedance 488  in the A, 704 in the V.  Threshold was 1 volt at 0.3 in the A and 1 volt  at 0.2 in the RV.  Battery voltage  was 3.1 volts.  Underlying rhythm was  sinus brady at 42.  He was 89% A-paced.  He was 6% V-paced.   IMPRESSION:  1. Ischemic cardiomyopathy.  2. Congestive heart failure.  3. Severe pain and weakness in his lower extremities, unclear if this      is claudication or perhaps spinal stenosis.  4. Status post implantable cardioverter-defibrillator insertion.   DISCUSSION:  Overall, Mr. Domine is stable.  His defibrillator is  working normally today.  Because of the weakness and pain in his legs, I  am going to obtain arterial Dopplers of his lower extremities to see if  he has evidence of peripheral vascular disease.  This may ultimately end  up being due to spinal stenosis, though he cannot have an MRI because of  his defibrillator been in place.  I will see the patient back in several  months.     [name].  [name], MD  Electronically Signed    GWT/MedQ  DD: 04/16/2008  DT: 04/17/2008  Job #: [id number]

## 2010-06-09 NOTE — Assessment & Plan Note (Signed)
[facility]                         ELECTROPHYSIOLOGY OFFICE NOTE   NAZIRE, FRUTH                        MRN:          [id number]  DATE:09/05/2006                            DOB:          1933-10-08    Mr. Edgett returns today for followup.  I saw him back one month ago,  and he states that since then, with adjustment of his defibrillator,  that his breathing has become shorter.  But, mostly his symptoms are  related to pain and weakness in his legs, particularly his thighs when  he walks.  He has noticed severe fatigue.  He denies medical or dietary  indiscretion, and he smokes cigarettes.   MEDICATIONS:  1. Zetia.  2. Furosemide.  3. Phenobarbitol.  4. Lipitor 80 mg a day.  5. Warfarin 5 mg a day.  6. Enalapril 2.5 mg a day.  7. Digoxin 0.25 mg daily.  8. Potassium.  9. Coreg CR 10 mg daily.  10.Aspirin.  11.Advair.   EXAMINATION:  He is a pleasant but chronically ill appearing man in no  distress.  Blood pressure 100/58, pulse 60 and regular, respirations 18,  weight 167 pounds.  NECK:  Revealed no jugular venous distention.  LUNGS:  Clear bilaterally to auscultation; no wheezes, rales or rhonchi.  CARDIOVASCULAR:  Reveals a regular rate and rhythm with normal S1, S2.  EXTREMITIES:  Demonstrate no clubbing, cyanosis or edema.  The pulses  are 2+ and symmetric.   Interrogation of his defibrillator demonstrates no intracoronary ICD  therapies.  Today we turned his rate up to 70; otherwise no changes were  made.  EKG demonstrates atrial pacing.   IMPRESSION:  1. Ischemic cardiomyopathy.  2. Congestive heart failure.  3. Sinus bradycardia.  4. Severe weakness and fatigue, particularly in the legs.   DISCUSSION:  Etiology of Mr. Whitehorn symptoms is unclear.  I do not  think he has worsening heart failure, though I cannot exclude this  definitively.  His QRS is somewhat wide at 122 msec.  Because his main  complaint is weakness,  pain and discomfort in his legs, I am wondering  whether he may be having some musculoskeletal symptoms from his statin  therapy, for which he is taking 80 mg of Lipitor daily.  Alternatively,  he may have peripheral vascular disease and  claudication.  I have recommended that we go ahead and see how he does  for now, and I will have him follow up with Dr. Lysbeth Galas and Dr. Delane Ginger.     [name]. [name], MD  Electronically Signed    GWT/MedQ  DD: 09/05/2006  DT: 09/06/2006  Job #: [id number]   cc:   [name], M.D.  [name], M.D.

## 2010-06-09 NOTE — Assessment & Plan Note (Signed)
[place] [facility]                         ELECTROPHYSIOLOGY OFFICE NOTE   Bobby, [name]                        MRN:          [id number]  DATE:04/18/2007                            DOB:          [birth year]    [name] returns today for follow-up.  He is a very pleasant man with  a history of congestive heart failure as well as ischemic  cardiomyopathy, sinus bradycardia and severe weakness and fatigue.  He  is status post dual-chamber ICD insertion.  He returns today for follow-  up.  He denies chest pain and his heart failure symptoms have improved.  He has some mild peripheral edema.   PHYSICAL EXAMINATION:  VITAL SIGNS:  His blood pressure today was  100/62, pulse 78 and regular, respirations were 18.  NECK:  No jugular venous distension.  LUNGS:  Clear bilaterally to auscultation.  No wheezes, rales or rhonchi  are present.  CARDIOVASCULAR:  Regular rate and rhythm.  Normal S1 and S2.  EXTREMITIES:  Trace peripheral edema bilaterally.   MEDICATIONS:  1. Furosemide 40 twice daily.  2. Phenobarbital as directed.  3. Warfarin 5 mg daily.  4. Enalapril 2.5 daily.  5. Digoxin 0.25 daily.  6. Nexium 40 daily.  7. Coreg 10 mg daily.  8. Multiple vitamins.  9. My records also suggested that he may well be on both once a day      and twice a day, Coreg, albeit at low-dose.   Interrogation of his defibrillator demonstrates Medtronic Maximo, the P-  waves are 3, the R-waves are 16, the impedance 496 in the A, 697 the V,  threshold 1 volt of 0.1 in the A, an 1 volt of 0.2 in V, battery voltage  3.14 volts.  He was 87% A paced and  4% V paced.   IMPRESSION:  1. Ischemic cardiomyopathy.  2. Congestive heart failure.  3. Sinus bradycardia.  4. Status post dual-chamber implantable cardioverter defibrillator      insertion.   DISCUSSION:  Overall [name] is stable, though he continues to be  weak and have class II, at least, heart failure  symptoms.  His  defibrillator is working normally today.  He is to follow up Dr. Elease Hashimoto.  I will plan to see him back in the office in several months.     [name]. [name], MD  Electronically Signed    GWT/MedQ  DD: 04/18/2007  DT: 04/18/2007  Job #: [id number]   cc:   [name], M.D.

## 2010-06-09 NOTE — Assessment & Plan Note (Signed)
[place] [facility]                         ELECTROPHYSIOLOGY OFFICE NOTE   GILES, CURRIE                        MRN:          [id number]  DATE:06/02/2006                            DOB:          Jan 01, 1934    [name] returns today for followup. His main complaint is that of  fatigue in his legs and lower back when he exerts himself. He has a long-  standing history of tobacco use and ischemic cardiomyopathy. The patient  has also a history of congestive heart failure. He is status post recent  ICD implantation. The patient continues to smoke cigarettes. He notes  that after his ICD was implanted for several weeks he felt well and did  not have much in the way of lower extremity weakness and pain and  fatigue, but in the last few weeks, he has had increasing shortness of  breath, and his big complaint today is that of pain in both of his legs  and his lower back when he exerts himself. To that end, we had him walk  in our office today. His heart rate got up to as high as a 100 beats per  minute. His oxygen saturation which was initially at 98 dropped down to  91%. After walking approximately 200 yard, he had to stop secondary to  fatigue and leg discomfort. He denies much in the way of peripheral  edema. He denies anginal symptoms.   On exam, he is a chronically ill appearing man in no acute distress. The  blood pressure was 106/67, the pulse 65 and regular. The respirations  were 18. The weight was 171 pounds.  The neck revealed no jugular venous distention.  LUNGS:  Were clear bilaterally to auscultation. No wheezes, rales or  rhonchi were present. There were decreased breath sounds throughout.  CARDIOVASCULAR EXAM:  Revealed a regular rate and rhythm with normal S1  and S2.  The extremities demonstrated no cyanosis, clubbing or edema.   Interrogation of his defibrillator demonstrates a Medtronic Maximo with  P waves of 2 and R waves of 16, the  impedence 552 in the atrium, 608 in  the atrium with a threshold of a volt at 0.1 in the atrium, a volt of  0.2 in the ventricle. The battery voltage was 3.2 volts. There were no  ATPs, no shock therapies noted. He did have 3 minute episodes of atrial  fibrillation. He was A paced 57% of the time. His counters demonstrate V  pacing as well, but I think mostly this is fusion.   IMPRESSION:  1. Ischemic cardiomyopathy.  2. Congestive heart failure.  3. Status post dual-chamber ICD.  4. Fatigue and weakness, particularly in the lower extremities.   DISCUSSION:  It is unclear why Mr. Salmons symptoms are as bad as they  are. I suspect he has fairly significant peripheral vascular disease and  wonder if he needs additional evaluation in  this regard. I will ask him to see Dr. Delane Ginger back who is his  primary cardiologist, and he has also got followup with Dr. Lysbeth Galas. He  certainly could be  anemic, contributing to his symptoms.     [name]. [name], MD  Electronically Signed    GWT/MedQ  DD: 06/02/2006  DT: 06/02/2006  Job #: [id number]   cc:   [name], M.D.  [name], M.D.

## 2010-06-09 NOTE — Assessment & Plan Note (Signed)
[place] [facility]                         ELECTROPHYSIOLOGY OFFICE NOTE   [name], [name]                        MRN:          [id number]  DATE:08/02/2006                            DOB:          [birth year]    REASON FOR VISIT:  [name] returns today for followup.  He is a  pleasant 77 year old man with a history of longstanding tobacco abuse  and ischemic cardiomyopathy, congestive heart failure status post ICD  insertion.  He continues to smoke cigarettes, though he states that he  is smoking less than a pack a day.  The wife suggests that perhaps this  is not exactly accurate.  The patient, when I saw him back in May, had  complaints of lower extremity weakness, pain and fatigue.  At that time  we considered a referral for peripheral vascular disease evaluation but  today the patient states that his symptoms are improved.  He continues  to be very active and he has a Programmer, systems, has a garden and has been  using this on a regular basis.  He also mows his grass.  He denies any  chest pain. He denies any intercurrent ICD therapies.  He denies  peripheral edema.   PHYSICAL EXAMINATION:  GENERAL APPEARANCE:  He is a pleasant,  chronically ill-appearing 77 year old man in no acute distress.  VITAL SIGNS:  Blood pressure 112/54, pulse was 63 and regular.  The  respirations are 18.  The weight is 173 pounds.  NECK:  Revealed no jugular venous distention.  LUNGS:  Are notable for decreased breath sounds throughout with  scattered rales.  There is no increased work of breathing.  CARDIOVASCULAR:  Reveals a regular rate and rhythm with normal S1 and  S2.  There are no murmurs, rubs or gallops.  EXTREMITIES:  Demonstrated no cyanosis, clubbing or edema.  The pulses  are 1+ and symmetric.   CLINICAL DATA:  Interrogation of his defibrillator demonstrates a  Medtronic Maximo with P waves of 2.3 and R waves of 16.  Impedance is  488 in the atrium, 568 in  the ventricles.  Threshold with volt of 0.2 in  atrium and volt of 0.2 in the ventricle.  The battery voltage is 3.2  volts.  The patient's AV delays had been increased to maximal duration.  He was 63% A-paced, 6% V-paced.   IMPRESSION:  1. Ischemic cardiomyopathy.  2. Congestive heart failure.  3. Status post ICD insertion.   DISCUSSION:  Overall, [name] is stable and his defibrillator  is working normally.  I have encouraged him to decrease his tobacco  consumption.  I have also encouraged him to be  as active as possible.  It sounds like he is doing this.  Will plan to  see him back in 9 months for ICD followup.     [name]. [name], MD  Electronically Signed    GWT/MedQ  DD: 08/02/2006  DT: 08/03/2006  Job #: [id number]   cc:   [name], M.D.

## 2010-06-12 NOTE — Assessment & Plan Note (Signed)
[place] [facility]                         ELECTROPHYSIOLOGY OFFICE NOTE   BRAYDN, CARNEIRO                        MRN:          [id number]  DATE:05/02/2006                            DOB:          10-20-1933    Mr. Scadden was seen today in the clinic on May 02, 2006 for a wound  check of his newly implanted Medtronic, model [id number] Maximow.  Date of  implant was April 14, 2006 for ischemic cardiomyopathy.  On  interrogation of his device today, his battery voltage is 3.21 with a  charge time of 7.92 seconds.  P-waves measured 2.2 millivolts with an  atrial capture threshold of 1 volt at 0.1 msec and an atrial lead  impedence of 536 ohms.  R-waves 15.2 mV, with a ventricular capture  threshold of 1 volt at 0.2 msec and a ventricular lead impendence of 552  ohms.  Shock impedence was 52.  There was non-sustained episode since  date of implant.  No changes were made in his parameters.  His Steri-  Strips have already been removed.  Wound is without redness or edema,  and he will be seen again in 3 months' time.      Altha Harm, LPN  Electronically Signed      [name]. [name], MD  Electronically Signed   PO/MedQ  DD: 05/02/2006  DT: 05/02/2006  Job #: [id number]

## 2010-06-12 NOTE — Discharge Summary (Signed)
NAMEJERETT, [name]               ACCOUNT NO.:  [id number]   MEDICAL RECORD NO.:  [id number]          PATIENT TYPE:  INP   LOCATION:  6731                         FACILITY:  [facility]   PHYSICIAN:  [name], M.D.    DATE OF BIRTH:  07-13-1933   DATE OF ADMISSION:  10/28/2006  DATE OF DISCHARGE:  10/31/2006                               DISCHARGE SUMMARY   DISCHARGE DIAGNOSES:  1. Minimally displaced 8th and 9th left rib fracture, secondary to      recent fall.  2. Recent fall with brief loss of consciousness after hitting the      ground.  3. Coronary artery disease, status post coronary artery bypass graft      and 2 myocardial infarctions and AICD placement.  4. Congestive heart failure with last echo report performed on January      of 2008 indicating an ejection fraction of 20% and akinesis of the      inferior wall.  5. Cerebrovascular accident.  6. Hyperlipidemia.  7. Gastroesophageal reflux disease.  8. Tobacco abuse.  9. History of mild progressive weakness in the lower legs.  10.History of iron deficiency anemia, but with negative stool guaiac      in the past.  11.Seizure disorder, first diagnosed over 30 years ago.  12.Chronic obstructive pulmonary disease.   DISCHARGE MEDICATIONS:  1. Coumadin 5 mg p.o. daily.  2. Digoxin 0.25 mg p.o. daily.  3. Phenobarbital 97.5 mg twice daily.  4. Roxicodone 5 mg every 4 hours as needed.  5. Carvedilol 3.125 mg twice daily.  6. Enalapril 2.5 mg p.o. daily.  7. Dilantin 100 mg p.o. 3 times daily.  8. Klor-Con 20 mEq p.o. daily.  9. Lasix 40 mg p.o. daily.  10.Lipitor 80 mg p.o. daily.  11.Nexium 40 mg p.o. daily.  12.Zetia 10 mg p.o. daily.  13.Advair 250/50 one puff twice daily.   FOLLOWUP:  The patient will follow up with his primary care physician,  Dr. Lysbeth Galas, and have his pain from his rib fractures evaluated and  treated.  The patient was discharged with Roxicodone 5 mg every 4 hours  as needed and his pain was  fairly well controlled during  hospitalization.   PROCEDURE:  1. Chest x-ray, performed on October 28, 2006, showing no acute      cardiopulmonary abnormality with a stable lower thoracic vertebral      body anterior wedge deformity.  2. Thoracic spine plain film, showing a stable anterior wedge      deformity of T11 and stable osteophyte formation.  No alignment at      the cervical thoracic junction.  Diffuse osteopenia with decreased      visualization of the upper thoracic levels of the Swimmer's view.  3. Four-view of the lumbar spine, performed on October 28, 2006,      showing stable height and alignment of the lumbar vertebra,      including mild L3 loss of height, probably due to degenerative      Schmorl nodes.  No spondylolisthesis and the sacral iliac joints  are normal.  No signs of acute fracture.  4. CT of the head without contrast, showing no evidence of acute      intracranial abnormality or skull fracture.  Evidence of remote      left occipital lobe infarct.  5. CT angiogram of the chest, performed on October 28, 2006, showing no      evidence of PE, but showing minimally displaced fractures of the      left ribs at 8 and 9.   ADMITTING HISTORY AND PHYSICAL:  This is a 77 year old with a history of  CHF and CAD, status post CABG and AICD placement, in addition to a  history of seizure disorder and hyperlipidemia who comes in after an  acute fall while picking pears earlier today.  He was picking pears from  the ground level, reached up with his left hand and suddenly fell  backwards into a ditch and upon hitting the ground he lost  consciousness.  He returned to consciousness, got up and drove home  while experiencing severe left-sided chest pain and back pain.  The pain  was 10/10 and continuous and his left side was very tender.   ADMITTING PHYSICAL EXAMINATION:  VITAL SIGNS:  Temperature 98.3.  Blood  pressure 125/79.  Pulse 72.  Respirations 20.  Saturating  96% on room  air.  GENERAL:  The patient was not in any acute distress.  He was well-  nourished.  HEENT:  Anicteric.  Pupils equally round and reactive, but minimally  responsive to light.  ENT:  Oropharynx clear.  No erythema or exudates.  NECK:  No cervical lymphadenopathy.  RESPIRATORY:  Wheezes bilaterally, but good air movement and no  accessory muscle use.  He has bibasilar crackles as well.  CARDIOVASCULAR:  Distant heart sounds, but regular rate and rhythm and  no obvious murmurs.  GI:  Soft, nontender, nondistended with positive bowel sounds.  Left  upper quadrant is slightly tender to palpation in addition to left flank  tenderness as well.  EXTREMITIES:  Nonedematous.  Nontender.  No edema.  Muscle strength +5  in the upper and lower extremities.  Pain with hip flexion on the left.  SKIN:  No rashes.  NEURO:  No sensory deficits.  Cranial nerves II-XII are intact.  PSYCH:  Oriented x3.   ADMITTING LABORATORY DATA:  Sodium 136, potassium 4.4, chloride 104, BUN  16, creatinine 1.1, glucose 103.  H&H is 15.6/46.0, white count 10.1,  platelets 97, ANC 7.9.  Phenobarbital level 53.7.  Digoxin level was  0.9.  Dilantin level 13.2.   HOSPITAL COURSE:  1. Acute rib fracture on the left side.  The patient came in after      experiencing a fall while picking pears.  He acutely injured his      ribs on his left side and presented with a significant amount of      pain on that side.  A chest x-ray and CT scan both confirmed the      fracture and we managed the patient's pain initially with IV      morphine then had to transition him to Dilaudid and eventually was      able to transition to p.o. pain medications, which included      Roxicodone every 4 hours.  The patient was tolerating the pain      regimen well upon the day of discharge.  He wanted to go home and      felt that  he could, with the help of his wife, perform his      activities of daily living effectively.  2. CHF.   The patient was not fluid overloaded upon admission.  He did      not have any JVD or peripheral edema.  His lungs were negative for      any signs of pulmonary edema.  We continued the patient's Lasix,      his beta-blocker and ACE inhibitors while in the hospital.  The      patient did very well and he was not complaining of any shortness      of breath or chest pain during his admission.  He will follow up      with his regular cardiologist at the next scheduled appointment.  3. COPD.  The patient had wheezes on his pulmonary exam.  He was      saturating well throughout his admission and not requiring oxygen.      He was not complaining of any shortness of breath.  He was placed      on his Advair and also placed albuterol SBA for any shortness of      breath.  His lung disease was stable during the course of his      admission.  He will follow up with his primary care physician in      the future for continued management of his lung disease.  He will      likely need an albuterol inhaler to go along with his Advair in the      future.  4. Lower extremity weakness and fatigue in the lower extremities with      minimal exertion.  We were concerned that the patient had      peripheral vascular disease, performed ABIs that were subsequently      negative.  ABIs are fairly sensitive in determining if a patient      has peripheral vascular disease, so at this point it is unclear      whether the patient has had recent lower extremity weakness and      fatigue.  He might need a neurologic workup on an outpatient basis      and we will leave this workup to his primary care doctor.  The      patient was ambulating without assistance upon discharge and was      generally at baseline function at that time.  5. Coumadin therapy.  Given the patient's history, as seen in the      electronic medical record available at [facility], it unclear why      the patient is on Coumadin.  Given what we know  about the patient,      we do not feel that he needs Coumadin and again we will leave the      decision of removing it to his primary care doctor.  The patient      may especially be at risk of intracerebral hemorrhage in the future      because of recent falls, making the use of Coumadin therapy in this      patient even more dangerous.   DISCHARGE LABORATORY DATA:  Sodium 136, potassium 3.9, chloride 101, CO2  27, glucose 103, BUN 14, creatinine 0.69.  BNP 494.  H&H 12.2/36.4,  white count 9.2, platelet count 144.  INR 2.2.  Phenobarbital level  39.5.   DISCHARGE VITAL SIGNS:  Temperature 99.7, blood pressure 117/70,  pulse  82, respirations 18, saturating 96% on 2 liters, while ambulatory  saturations staying well above 90%.      Lollie Sails, MD  Electronically Signed      [name], M.D.  Electronically Signed    CB/MEDQ  D:  12/01/2006  T:  12/02/2006  Job:  [id number]   cc:   [name], M.D.  [name], M.D.

## 2010-06-12 NOTE — Assessment & Plan Note (Signed)
[facility]                         ELECTROPHYSIOLOGY OFFICE NOTE   ATLEE, KLUTH                        MRN:          [id number]  DATE:03/23/2006                            DOB:          1933-02-02    [name] is referred today by Dr. Kristeen Miss for evaluation and  consideration for ICD implantation (questionable BiV ICD). The patient  is a very pleasant, 77 year old man with longstanding tobacco use  history, coronary artery disease status post bypass surgery in 1998 with  ongoing tobacco abuse. He has a myocardial infarction x2. The patient  has not recently had syncope though he does not having passed out in the  remote past. He states that with any significant exertion he gets short  of breath. His heart failure symptoms appear to be class 3 in nature. He  denies ongoing chest pain at the present. He does have a history of  peripheral edema.   FAMILY HISTORY:  Notable for both parents being deceased of unknown  causes. He has one sister who is deceased as well.   MEDICATIONS:  Zetia, furosemide, dilantin, phenobarb, Lipitor, warfarin,  enalapril, digoxin, Coreg, and Advair.   SOCIAL HISTORY:  The patient is a longstanding tobacco user and is still  smoking cigarettes. As noted, he denies alcohol abuse. He is married.   REVIEW OF SYSTEMS:  Notable for bronchitis and productive cough. He has  a history of generalized fatigue, he has a history of anemia, he has a  history of peptic ulcer disease and abdominal discomfort from this.  Otherwise his review of systems was negative except as noted in the HPI.  Of note, the patient does have PND and orthopnea.   PHYSICAL EXAMINATION:  GENERAL:  He is a 77 year old man who is  chronically ill-appearing but in no acute distress to date.  VITAL SIGNS:  The blood pressure was 106/65, the pulse was 50 and  regular, respirations 18, weight 174 pounds.  HEENT:  Normocephalic, atraumatic. He was  somewhat unkempt appearing.  Oropharynx was moist. Sclera were anicteric.  NECK: Revealed no jugular venous distention. There was no thyromegaly,  trachea was midline.  LUNGS:  Revealed scattered wheezes. There was no rhonchi, no rales.  There was no increased work of breathing.  CARDIOVASCULAR:  Revealed a regular rate and rhythm with somewhat  distant heart sounds. There was a soft systolic murmur at the left lower  sternal border. The PMI was enlarged and laterally displaced. I did not  appreciate an S3 or an S4 gallop today.  ABDOMEN:  Soft, nontender, nondistended. There was no organomegaly. The  bowel sounds were present. There was no rebound or guarding.  EXTREMITIES:  Demonstrated no cyanosis, clubbing or edema. The pulses  were 2+ and symmetric.  NEUROLOGIC:  Alert and oriented x3. Cranial nerves were intact. Strength  was 5/5 and symmetric.   The EKG demonstrates marked sinus bradycardia with first degree AV block  at 46 beats per minute. There was an intraventricular conduction delay  and a QRS restoration of 120 msec. The PR interval ws 214 msec.   IMPRESSION:  1. Class 3 heart failure.  2. Ischemic cardiomyopathy despite multiple medications.  3. Symptomatic bradycardia.  4. Ongoing tobacco abuse with chronic obstructive pulmonary disease.   DISCUSSION:  I discussed the treatment options with Mr. Jaquith in  detail. The risks, benefits, goals and expectations of ICD implantation  have been discussed with him. With his QRS restoration of 120 msec will  plan to review his 2-D echo to see if there is any evidence of  dyssynchrony. If there is then we will proceed with BiV ICD  implantation, if not would consider doing tissue Doppler echo imaging to  better characterize whether he has dyssynchrony on his 2-D echo. If he  has no dyssynchrony then a dual-chamber ICD would be warranted. If there  is dyssynchrony then a BiV ICD would be warranted.     [name]. [name], MD   Electronically Signed    GWT/MedQ  DD: 03/23/2006  DT: 03/23/2006  Job #: [id number]   cc:   [name], M.D.

## 2010-06-12 NOTE — Op Note (Signed)
NAMEPASCAL, STIGGERS NO.:  [id number]   MEDICAL RECORD NO.:  [id number]          PATIENT TYPE:  INP   LOCATION:  2807                         FACILITY:  [facility]   PHYSICIAN:  [name]. [name], MD    DATE OF BIRTH:  [birth year]   DATE OF PROCEDURE:  DATE OF DISCHARGE:                               OPERATIVE REPORT   PROCEDURE PERFORMED:  Implantation of dual chamber ICD.   INDICATION:  Symptomatic bradycardia with ischemic cardiomyopathy, EF  30%, class 2-3 heart failure.   INTRODUCTION:  The patient is a 77 -year-old man who is status post  myocardial infarction x 2, status post bypass surgery with congestive  heart failure and sinus bradycardia with heart rates on low dose beta  blockers in the 45 beat per minute range.  The patient with all of the  above, was referred for dual chamber ICD implantation.   PROCEDURE:  After informed consent was obtained, the patient was taken  to the diagnostic  EP lab in a fasting state.  After doing blood  pressure and draping, intravenous fentanyl and midazolam was given for  sedation.  Lidocaine,  36 units, was infiltrated into the left  infraclavicular region.  A 7 cm incision was carried out over this  region. Electrocautery utilized to dissect down to the fascial plane.  The left subclavian vein was punctured x 2 and the Medtronic model 6947,  65 cm active fixation inferior evolution lead serial #[id number] was  advanced into the right ventricle and the Medtronic model 5076, 52 cm  active fixation leads serial #[id number] was advanced into the right  atrium.  Mapping was carried in the right ventricle  at the final site  on the floor of the RV apex.  The R waves measured 17and the impedence  was 946 Ohms with the lead actively fixed.  The threshold 0.5 volts at  0.5 milliseconds.  10 volt pacing did not extend with the diaphragm.  With the ventricular lead in satisfactory position, attention was then  turned to placement  of the atrial lead.  It was placed in the right  anterolateral wall of the right atrium.  The initial P waves were 3  millivolts with patient impedence with the lead actively fixed was 565  Ohms.  Threshold 0.4 volts at 0.5 milliseconds.  10 volt pacing from the  atrium did not extend with the diaphragm.  With these satisfactory  parameters, the leads were secured with to the subpectoralis fascia with  a figure-of-eight silk suture.  The sewing sleeve was also secured with  silk suture.  Electrocautery was utilized to make a subcutaneous pocket.  Kanamycin irrigation was utilized to irrigate the pocket and  electrocautery used to assure hemostasis.  The Medtronic maximal DR  serial #[id number] was connected to the atrial and ventricular pacing  leads and placed in a subcutaneous pocket.  Generator was secured with  silk suture.  Additional Kanamycin was utilized to irrigate the pocket.  The fibrillator threshold testing was carried out.   After the patient was more deeply sedated with Fentanyl and Versed, VF  was induced with a T-wave shock and a 15 joule shock was delivered which  terminated EF and restored sinus rhythm.  Five minutes was allowed to  elapse and a 2nd defibrillation threshold test carried out.  Again VF  was induced with a P-wave shock and again, a 15 joule shock was  delivered, which terminated VF and restored sinus rhythm.  At this  point, no additional defibrillation threshold testing was carried out  and the incision was closed with a layer of 2-0 Vicryl followed by 3-0  Vicryl, followed by a layer of 4-0 Vicryl.  Benzoin was painted on the  skin, Steri-Strips were applied and appropriate dressing was placed.  The patient was returned to his room in satisfactory condition.   COMPLICATIONS:  There were no immediate complications.   RESULTS:  Successful implantation of a Medtronic dual chamber ICD in a  patient with symptomatic bradycardia and ischemic cardiomyopathy.   EF  30%, class 2-3 heart failure.      [name]. [name], MD  Electronically Signed     GWT/MEDQ  D:  04/14/2006  T:  04/14/2006  Job:  [id number]   cc:   [name], M.D.

## 2010-06-12 NOTE — Discharge Summary (Signed)
[name], [name]               ACCOUNT NO.:  [id number]   MEDICAL RECORD NO.:  [id number]          PATIENT TYPE:  INP   LOCATION:  3740                         FACILITY:  [facility]   PHYSICIAN:  [name]. [name], MD    DATE OF BIRTH:  1933/09/28   DATE OF ADMISSION:  04/14/2006  DATE OF DISCHARGE:  04/15/2006                               DISCHARGE SUMMARY   ELECTROPHYSIOLOGIST:  Dr. Lewayne Bunting.   CARDIOLOGIST:  Dr. Elease Hashimoto.   PRIMARY CARE PHYSICIAN:  Dr. Lysbeth Galas.   ALLERGIES:  DIOVAN.   PRINCIPAL DIAGNOSES:  1. Discharging day #1 status post implant of Medtronic MAXIMO dual-      chamber cardioverter defibrillator with defibrillator threshold      study less than or equal to 15 joules.  2. Ischemic cardiomyopathy, ejection fraction of 20% by echocardiogram      January 2008.  There was a possible bicuspid aortic valve; moderate      mitral regurgitation; akinesis of the entire inferior wall.      a.     history of myocardial infarction x2.      b.     Status post coronary artery bypass graft surgery.   SECONDARY DIAGNOSES:  1. Class III [employer] [place] [employer] chronic, systolic congestive      heart failure.  Decomposition with hospitalization January 2008.  1. Bradycardia precludes effective beta blocker therapy.  2. Seizure disorder.  3. Dyslipidemia.  4. History of cerebrovascular accident on chronic Coumadin.  5. Gastroesophageal reflux disease.  6. Narrow QRS.  7. Ongoing tobacco habituation; smoking cessation consult was entered,      but because it was good Friday, doubt that it will be carried      through.  The patient will be given telephone number to contact      smoking cessation on discharge.   PROCEDURE:  April 15, 2006:  Implant of Medtronic MAXIMO DR dual-chamber  cardioverter defibrillator, Dr. Lewayne Bunting; DFT less than or equal to  15 joules.  The patient has no postprocedural complications.  Chest x-  ray has been examined and leads are in  appropriate position.  The device  will be interrogated prior to discharge.   The patient has been given instructions as to mobility of the arm and  incision care.  He has followup appointment at [facility] in 2  weeks and the ICD clinic, our office will call with that appointment.  He is to see Dr. [name] in 4 weeks and he is asked to call Dr. Harvie Bridge  office to arrange followup.  He will need a 27-month ICD interrogation to  check and maybe reprogram his device.   DISCHARGE MEDICATIONS:  1. Zetia 10 mg daily.  2. Furosemide 40 mg daily.  3. Phenytoin (Dilantin) 100 mg daily.  4. Phenobarbital 97.2 mg twice daily.  5. Lipitor 80 mg daily at bedtime.  6. Enalapril 2.5 mg daily.  7. Digitek 0.25 mg daily.  8. K-Dur or potassium chloride 10 mEq two tablets daily.  9. Nexium 40 mg daily.  10.Enteric-coated aspirin 325 mg daily.  11.Advair 250/50 one puff twice daily.  12.Coumadin 5 mg or as directed.   Once again, the patient has been asked to keep his incision dry for the  next 7 days.  He is to sponge bathe until Thursday, March 27.  He is  asked not to drive for the next week, not until Thursday, March 27; and  mobility of the arm will begin fully on March 27.  He is asked not to  lift anything heavier than 10 pounds for the next 4-6 weeks.   Once again, the patient is counseled very strenuously to stop smoking.  I have even circled that part on his discharge sheet.   DISCHARGE LABORATORY DATA:  His protime is 17.4, INR is 1.4.   Laboratory studies pertinent to this admission were taken April 07, 2006:  White cells 7.9, hemoglobin 12.6, hematocrit 38.3 and platelets  of 192.  Serum electrolytes:  Sodium 140, potassium is 4, chloride 101,  carbonate 32, glucose is 76, BUN is 21, creatinine 1.   TIME SPENT:  Greater than 35 minutes.      Maple Mirza, PA      [name]. [name], MD  Electronically Signed    GM/MEDQ  D:  04/15/2006  T:  04/15/2006  Job:   [id number]

## 2010-06-12 NOTE — H&P (Signed)
NAMESAMYAK, SACKMANN NO.:  [id number]   MEDICAL RECORD NO.:  [id number]          PATIENT TYPE:   LOCATION:                                 FACILITY:   PHYSICIAN:  Lonia Blood, M.D.DATE OF BIRTH:  01-30-1933   DATE OF ADMISSION:  05/03/2005  DATE OF DISCHARGE:                                HISTORY & PHYSICAL   PRIMARY CARE PHYSICIAN:  [name], M.D., [facility].   CHIEF COMPLAINT:  Persistent diarrhea.   HISTORY OF PRESENT ILLNESS:  Mr.  [name] is a very pleasant 77-year-  old gentleman with a complex medical history to include coronary artery  disease and a CVA.  He was in his usual state of health until Saturday (it  is presently Monday) when he suffered the acute onset of perfuse watery  diarrhea.  He had multiple bouts throughout Saturday.  Sunday his diarrhea  continued.  He was able to eat and drink and both Saturday and for most of  Sunday, so he did not seek further attention.  Today the patient noted  increased frequency of his diarrhea.  Any attempts to eat or drink seemed to  make his diarrhea actually worse.  He then began to feel nauseated.  He was  somewhat lightheaded on attempts to ambulate.  He, therefore presented to  his primary care physician in [place].  There he was found to have a  systolic blood pressure of 90 and a heart rate of 110. He was, therefore  referred to [facility] for evaluation where I have seen him.  Presently he denies chest pain, shortness of breath, fevers, or chills.  He  does report that his abdomen is somewhat tender throughout, but he is not  having severe pain.  His diarrhea has slowed for now.  The patient does feel  very weak.   REVIEW OF SYSTEMS:  Comprehensive Review of Systems is unremarkable with the  exception of elements noted in History of Present Illness above.   PAST MEDICAL HISTORY:  1.  Coronary artery disease status post coronary artery bypass grafting.  2.  CVA  without long-term effects, occurring approximately 8 years ago.  3.  Tobacco abuse in the amount of 1 pack per day since the patient was 77      years old.  4.  Seizure disorder, last seizure reported to be 12 years ago.  5.  Hypercholesterolemia.  6.  Gastroesophageal reflux disease with probable hiatal hernia.   OUTPATIENT MEDICATIONS:  1.  Dilantin ER 200 mg daily.  2.  Aspirin 325 mg daily.  3.  Prevacid 50 mg daily.  4.  Somantadine 400 mg twice daily.  5.  Zetia 10 mg daily.  6.  Coumadin 5 mg alternating with 2.5 mg on an every-other-day basis.  7.  Digoxin 0.25 mg daily.  8.  Enalapril 12.5 mg daily.  9.  Potassium chloride 20 mEq daily.  10. Lasix 40 mg daily.  11. Lipitor 80 mg daily.  12. Phenobarbital 97.2 mg daily.   ALLERGIES:  DIOVAN.   FAMILY HISTORY:  Noncontributory  secondary to age.   SOCIAL HISTORY:  The patient lives in [place].  He is married.  He has 2  healthy children.  He does not drink.  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   DATA REVIEW:  CBC is normal.  Sodium is low at 132.  Potassium, chloride,  and bicarb are normal.  BUN is elevated at 20; creatinine is normal at 1.  Serum glucose and calcium are normal.  LFTs are normal.  Albumin is 3.5.  INR is 2.2.  PTT is 50.  Magnesium is 2.1.  Phosphorus is 3.9.   A 12-lead EKG reveals normal sinus rhythm at 73 beats per minute with  occasional PVCs.  There is significant ST depression in leads I and aVL that  is not appreciable on previous EKGs from February 2006 and September 1999.  There is no evidence of acute ST elevation.   PHYSICAL EXAMINATION:  VITAL SIGNS: Temperature 97.4, blood pressure 121/75,  heart rate 75, respiratory rate 20, O2 saturation 98% on 2 liters per minute  nasal cannula.  Blood pressure 90/60 in the MD's office with a heart rate of  110.  GENERAL:  Well-developed, well-nourished male in no acute respiratory  distress.  HEENT:  Normocephalic and atraumatic.  Pupils equal, round, and  reactive to  light and accommodation.  Extraocular muscles intact bilaterally.  Oral  cavity and oropharynx clear with bilateral partial dentures in place.  NECK:  No JVD, no lymphadenopathy.  LUNGS:  Clear to auscultation bilaterally without wheezes or rhonchi.  CARDIOVASCULAR:  Regular rate and rhythm without murmur, gallop, or rub.  ABDOMEN:  Mildly tender to deep palpation throughout.  No focal tenderness,  no rebound, nondistended, soft.  Bowel sounds present.  No  hepatosplenomegaly.  EXTREMITIES: No significant cyanosis, clubbing, or edema bilateral lower  extremities.  NEUROLOGIC:  5/5 strength throughout bilateral upper and lower extremities.  Cranial nerves II-XII intact bilaterally. The patient is alert and oriented  x4.  He has intact sensation to touch throughout.   IMPRESSION AND PLAN:  1.  Symptomatic dehydration: The patient is suffering with a significant      dehydration.  He will require IV fluid support initially because his      p.o. intake is significantly decreased. I will gently hydrate him using      isotonic saline given my suspicion for heart failure based upon his      medication regimen.  We will follow his BUN and creatinine ratio closely      as well as his blood pressure.  2.  Persistent watery diarrhea: Symptoms are most consistent with viral      gastroenteritis. The patient has no recent risk factors to suggest a      Clostridium difficile colitis infection.  I will not use empiric      antibiotics.  The patient will be treated with IV fluids and close      monitoring.  3.  Electrocardiographic changes:  The patient does show, as noted above, ST      segment depression in leads I and aVL.  It is not clear if this is a      brand new development, but it was not present in February 2006.  Given      the patient's known history of coronary artery disease, I feel it is     most prudent to follow this.  The patient specifically is having no      chest  pain or  symptoms to suggest unstable angina.  We will hydrate the      patient as noted above. We will rule him out for acute myocardial      infarction with serial enzymes and EKG in the morning.  We will attempt      to obtain further records from Dr. Harvie Bridge office in the morning and      possibly compare this EKG to a more recent EKG.  If there is any      cardiovascular questions remaining after this is accomplished, we will      consult Dr. Elease Hashimoto.  4.  Tobacco abuse: The patient was counseled extensively as to the need to      discontinue all tobacco abuse.  Tobacco cessation consultation will be      requested while the patient is in house to encourage this.  5.  Seizure disorder: We will check a Dilantin level.  We will continue the      patient's current regimen.  I am checking Dilantin simply to assure the      patient's level is not supratherapeutic.  Given that he has not had a      seizure in over 12 years, even if he is subtherapeutic, I would be      extremely hesitant to adjust his regimen.  6.  Coumadin dosing: The exact indication for the patient's Coumadin dosing      is not clear.  Hopefully the patient's cardiac records will explain the      reason for his Coumadin therapy. We will ask pharmacy to direct his      Coumadin dosing.  There is no evidence of complication at this time, and      INR is in fact within the therapeutic range.      Lonia Blood, M.D.  Electronically Signed     JTM/MEDQ  D:  05/03/2005  T:  05/03/2005  Job:  [id number]   cc:   [name], M.D.  Fax: [phone]   [name], M.D.  Fax: [phone]

## 2010-06-12 NOTE — Discharge Summary (Signed)
[name], [name]               ACCOUNT NO.:  [id number]   MEDICAL RECORD NO.:  [id number]          PATIENT TYPE:  INP   LOCATION:  6702                         FACILITY:  [facility]   PHYSICIAN:  Madaline Savage, MD        DATE OF BIRTH:  11-Oct-1933   DATE OF ADMISSION:  01/28/2006  DATE OF DISCHARGE:  01/30/2006                               DISCHARGE SUMMARY   PRIMARY CARE PHYSICIAN:  Dr. Lysbeth Galas.   PRIMARY CARDIOLOGIST:  Dr. Eden Emms.   DISCHARGE DIAGNOSES:  1. Decompensated systolic heart failure with ejection fraction of 20%.  2. History of coronary artery disease.  3. Seizure disorder.  4. Iron deficiency anemia.  5. Hyperlipidemia.   HISTORY OF PRESENT ILLNESS:  Mr. Hogston is a 77 year old gentleman with  a history of coronary artery disease, congestive heart failure, and  chronic Coumadin therapy who presented with complaints of shortness of  breath, which started 2 to 3 days ago.  His breathing was getting worse,  so he was admitted for further management and his cardiologist was  consulted.   PROBLEM LIST:  1. Decompensated heart failure.  He was treated with Lasix and we did      an echocardiogram.  His echocardiogram done on January 4 showed an      ejection fraction of 20% with severe global hypokinesis and      moderate MR.  His breathing is now back to his baseline.  He says      his abdominal and leg swelling has resolved and he will be      discharged home in stable condition to follow up with his      cardiologist.  2. Iron deficiency anemia.  On admission his hemoglobin was found to      be 9.5 with an MCV of 72.2.  We did iron studies on him, which      showed an iron of 22, percent saturation of 5, and ferritin of 10,      which is consistent with iron deficiency anemia.  We did do a stool      for a gallbladder, which was negative in the hospital.  He is on      chronic Coumadin therapy secondary to his cardiac problems.  He      will need further workup of  his iron deficiency anemia, possible      colonoscopy, or EGD.  During the course of hospital stay his      hemoglobin has been stable.  He will be started on iron supplements      in the hospital.  3. Seizure disorder.  When he was admitted his Dilantin level was low.      We did give him an extra dose of Dilantin to bring up the Dilantin      level.  He will continue on his home dose of medications.   DISCHARGE MEDICATIONS:  1. Ferrous sulfate 325 mg twice daily.  2. Aspirin 325 mg once daily.  3. Zetia 10 mg once daily.  4. Lasix 40 mg once daily.  5.  Phenytoin 100 mg twice daily.  6. Phenobarbital 97mg  3 times daily.  7. Lipitor 80 mg daily.  8. Coumadin as before.  9. Enalapril 2.5 mg daily.  10.Digoxin 0.25 mg daily.  11.Potassium chloride 10 mEq 2 tablets in the morning.  12.Nexium 40 mg daily.  13.Coreg 10 mg daily.  14.ProAIR HFA 2 puffs every 6 hours as needed.   DISPOSITION:  He is now discharged home in stable condition.   FOLLOWUP:  He will follow up with his primary care doctor, Dr. Lysbeth Galas in  2 to 3 weeks.  He will also follow up with his cardiologist Dr. Eden Emms  in 2 to 3 weeks.   RECOMMENDATIONS:  He is advised to follow up with primary care doctor  regarding his iron deficiency anemia, for which he will need a workup as  an outpatient.      Madaline Savage, MD  Electronically Signed     PKN/MEDQ  D:  01/30/2006  T:  01/30/2006  Job:  [id number]

## 2010-06-12 NOTE — Letter (Signed)
March 23, 2006    [name], M.D.  [address], [address]  [place],  [place] [address]   RE:  CHRISTIEN, FRANKL  MRN:  [id number]  /  DOB:  Dec 08, 1933   Dear Michele Mcalpine:   Thank you for referring Mr. Jaevion Goto for E.P. evaluation and  consideration for ICD implantation. As you know, he is a very pleasant  77 year old male with longstanding coronary disease status post  myocardial infarction x2, status post bypass surgery, who has been on  maximum medical therapy somewhat limited by his symptomatic bradycardia.  He also has COPD. He has clear-cut Class III heart failure symptoms. His  QRS duration was 120 milliseconds today with a PR interval of 214  milliseconds. His physical examination has been well-characterized by  yourself and will not be repeated.   I have discussed treatment options with Mr. Jury. He qualifies for  prophylactic ICD insertion based on the MADIT II criteria. The real  question is whether a dual chamber ICD or what bi-ventricular ICD would  be of most benefit for this patient. He may well have evidence of  desynchrony based on his long PR interval and his QRS duration of 120  milliseconds, but he does not fit clear-cut criteria for prophylactic bi-  ventricular ICD implantation (the QRS would need to be 130 or greater).  I have recommended that we review his echo and see if there is clear  evidence of desynchrony on it. If not, then a special tissue Doppler  echo to be obtained,  which would help [place] know whether or not he might benefit from cardiac  resynchronization therapy. If not, then we would plan on proceeding with  dual chamber ICD implantation so that we could increase his heart rate  and allow you to up-titrate his beta-blocker therapy. Once again, thanks  for referring Mr. Zahner for E.P. evaluation.    Sincerely,      [name]. [name], MD  Electronically Signed    GWT/MedQ  DD: 03/23/2006  DT: 03/23/2006  Job #: [id number]

## 2010-06-12 NOTE — H&P (Signed)
NAMEJUSTON, GOHEEN NO.:  [id number]   MEDICAL RECORD NO.:  [id number]          PATIENT TYPE:  INP   LOCATION:  1825                         FACILITY:  [facility]   PHYSICIAN:  Elliot Cousin, M.D.    DATE OF BIRTH:  [birth year]   DATE OF ADMISSION:  01/28/2006  DATE OF DISCHARGE:                              HISTORY & PHYSICAL   PRIMARY CARE PHYSICIAN:  Dr. Lysbeth Galas.   PRIMARY CARDIOLOGIST:  Dr. Kristeen Miss.   CHIEF COMPLAINT:  Shortness of breath and swelling.   HISTORY OF PRESENT ILLNESS:  The patient is a 77 year old man with a  past medical history significant for coronary artery disease, congestive  heart failure, and chronic Coumadin therapy, who presents to the  emergency department with a chief complaint of shortness of breath and  swelling.  The shortness of breath started approximately 2-3 days ago.  The shortness of breath is worse with activity and when he lies flat.  He presented to his primary cardiologist, to Dr. Elease Hashimoto, several days  ago.  At that time, he was prescribed an inhaler for wheezing.  The  inhaler did not help.  The patient continued to have worsening shortness  of breath.  He noticed that his abdomen and his legs were more swollen.  He felt as if his chest was swollen as well.  He began having some  intermittent chest pain earlier yesterday.  The chest pain was located  all over.  It was described as stinging and burning.  It was not  associated with nausea, vomiting, diaphoresis or lightheadedness.  The  patient did receive aspirin and sublingual nitroglycerin by the EMT en  route to the hospital.  He is currently chest-pain-free.  Per the  emergency department physician, Dr. Rubin Payor, there is a report that  the patient had an episode of nonsustained ventricular tachycardia.  However, per the EKG strip, there is no evidence of ventricular  tachycardia.   During the evaluation in the emergency department, the patient is noted  to  be hemodynamically stable, although his blood pressure is low normal  at 96/56.  His heart rate has been ranging between 50 and 60 beats per  minute.  His EKG reveals nonspecific ST and T wave abnormalities and a  nonspecific intraventricular conduction delay.  A CT scan of the abdomen  was ordered and it reveals no acute findings in the abdomen; however,  there are changes consistent with mild edema and small effusions.  His  chest x-ray reveals cardiomegaly with diffuse interstitial prominence  and interstitial edema.  The patient will be admitted for further  evaluation and management.   PAST MEDICAL HISTORY:  1. Coronary artery disease, status post CABG in the past.  2. History of congestive heart failure (? systolic versus ?      diastolic).  3. History of a stroke 8 years ago.  No residual deficits.  4. Hyperlipidemia.  5. Chronic Coumadin therapy (the exact reason for Coumadin therapy is      unclear; perhaps it is because of coronary artery disease or  history of a stroke versus other).  6. Seizure disorder.  The patient has not had a seizure in many years.  7. Gastroesophageal reflux disease.   MEDICATIONS:  1. Aspirin 325 mg daily.  2. Warfarin 5 mg one and a half tablets every day, except one tablet      once weekly.  3. Zetia 10 mg daily.  4. Furosemide 40 mg daily.  5. Phenytoin 100 mg one tablet in the morning and one tablet at night.  6. Phenobarbital one 97.2 mg tablet in the morning and one tablet at      night.  7. Lipitor 80 mg nightly.  8. Enalapril 2.5 mg daily.  9. Digitek 0.25 mg daily.  10.Klor-Con 10 mEq two tablets in the morning.  11.Nexium 40 mg daily.  12.Coreg 10 mg nightly.  13.ProAir HFA two puffs every 6 hours.   ALLERGIES:  The patient has an allergy to DIOVAN.   SOCIAL HISTORY:  THE PATIENT CANNOT READ OR WRITE.  [place].  He has 2 children.  He is retired.  He  smokes 1 pack of cigarettes per  day and has been doing so for 55 years.  He denies alcohol and illicit drug use.   FAMILY HISTORY:  Both of his parents died of heart attacks.   REVIEW OF SYSTEMS:  The patient's review of systems is positive for  intermittent chest pain, wheezing, intermittent swelling in the legs;  otherwise, review of systems is negative.   PHYSICAL EXAMINATION:  VITAL SIGNS:  Temperature 97.4, blood pressure  96/56, pulse 58, respiratory rate 18 and oxygen saturation 98% on 2 L of  nasal cannula oxygen.  GENERAL:  The patient is a pleasant 77 year old Caucasian man who is  currently lying in bed in no acute distress.  HEENT:  Head is normocephalic and non-traumatic.  Pupils are equal,  round and reactive to light.  Extraocular movements intact.  Conjunctivae are clear.  Sclerae are white.  Tympanic membranes are  clear bilaterally.  Nasal mucosa is dry.  No sinus tenderness.  Oropharynx reveals no teeth.  Mucous membranes are mildly dry.  No  posterior exudates or erythema.  NECK:  Supple with no adenopathy and no thyromegaly.  Mild JVD is  present.  HEART:  S1 and S2 with a soft systolic murmur.  LUNGS:  Decreased breath sounds in the bases with a few fine crackles in  the mid lobes bilaterally.  There are occasional wheezes as well.  ABDOMEN:  Positive bowel sounds.  Soft, obese, nontender and non-  distended.  No hepatosplenomegaly.  No masses palpated. No appreciable  ascites.  EXTREMITIES:  Pedal pulses 2+ bilaterally.  Multiple varicosities around  both ankles bilaterally.  Trace of pedal edema bilaterally.  Well-healed  scar on the left leg.  Mild arthritic changes seen in the knees,  feet  and the hands.  NEUROLOGIC:  The patient is alert and oriented x3.  Cranial nerves II-  XII are intact.   ADMISSION LABORATORY DATA:  EKG reveals normal sinus rhythm with a heart  rate of 74 beats per minute, nonspecific intraventricular conduction delay, ST and T wave abnormalities in the lateral  leads.   Lipase 41.  Total bilirubin 0.4, alkaline phosphatase 99, SGOT 27, SGPT  21, total protein 6.9, albumin 3.7.  Troponin I 0.05.  CK-MB 3.0,  myoglobin 92.9.  Sodium 133, potassium 4.5, chloride 103, glucose 95,  BUN 15, creatinine 1.0, bicarbonate  25.  PT 20, INR 1.6, digoxin level  0.5.  Phenytoin 2.8.  WBC 9.6, hemoglobin 9.5, hematocrit 30.2,  platelets 233,000, MCV 72.2.   IMAGING STUDIES:  Chest x-ray reveals cardiomegaly with diffuse  interstitial prominence and Kerley B lines, suspect interstitial edema.   CT scan of the abdomen and pelvis reveals cardiomegaly with mild edema  and small effusions, no acute findings in the abdomen, no acute findings  in the pelvis, and prostate enlargement.   ASSESSMENT:  1. Acute congestive heart failure.  There is no current record of a      recent echocardiogram in the E-chart system.  The patient probably      has underlying systolic congestive heart failure.  He is followed      by cardiologist, Dr. Elease Hashimoto.  2. Intermittent bradycardia.  The patient's heart rate has been      ranging between 50 and 60 beats per minute in the emergency      department.  He is treated chronically with Coreg and digoxin.  His      digoxin level is currently subtherapeutic.  3. Coronary artery disease, status post coronary artery bypass graft.      The patient's EKG reveals ST and T wave abnormalities in the      lateral leads.  He has had chest pain, although the pain appears to      be atypical.  4. Chronic Coumadin therapy.  It is unclear why the patient is on      Coumadin; however, his INR is mildly subtherapeutic.  5. Microcytic anemia.  The patient's hemoglobin is 9.5 and his MCV is      72.2.  He has had no recent black tarry stools or bright red blood      per rectum.  6. Seizure disorder.  The patient is currently asymptomatic on      Dilantin and phenobarbital.  7. Tobacco abuse.   PLAN:  1. The patient will be admitted for further  evaluation and management.  2. We will check a BNP, cardiac enzymes, TSH, and a 2-D      echocardiogram.  3. Consult Dr. Elease Hashimoto in the morning.  4. We will start Lasix 40 mg IV q.12 h.  We will add Nitropaste and      titrate as his blood pressure allows.  5. Continue cardiac medications at a lower dose secondary to low to      low-normal blood pressures.  6. We will guaiac stools and check iron studies.  We will add a      multivitamin with iron and Nu-Iron daily.  7. Tobacco cessation counseling.      Elliot Cousin, M.D.  Electronically Signed     DF/MEDQ  D:  01/28/2006  T:  01/28/2006  Job:  [id number]   cc:   [name], M.D.  [name], M.D.

## 2010-06-12 NOTE — Discharge Summary (Signed)
NAMELAJUAN, KOVALESKI               ACCOUNT NO.:  [id number]   MEDICAL RECORD NO.:  [id number]          PATIENT TYPE:  INP   LOCATION:  4743                         FACILITY:  [facility]   PHYSICIAN:  Jonna L. Robb Matar, M.D.DATE OF BIRTH:  February 07, 1933   DATE OF ADMISSION:  05/03/2005  DATE OF DISCHARGE:  05/05/2005                                 DISCHARGE SUMMARY   PRIMARY CARE PHYSICIAN:  Dr. Joette Catching.   CARDIOLOGIST:  Dr. Kristeen Miss.   FINAL DIAGNOSES:  1.  Hypotension secondary to dehydration.  2.  Gastroenteritis.  3.  Congestive heart failure.  4.  Coronary artery disease.  5.  Old cerebrovascular accident.  6.  Epilepsy.  7.  Hypercholesterolemia.  8.  Gastroesophageal reflux disease.   ALLERGIES:  ARBs.   HISTORY:  This 77 year old Caucasian male developed acute watery diarrhea  and by the third day, he was feeling nauseated and orthostatic.  No  shortness of breath, chest pain, fever or chills.  He was having generalized  abdominal pain and weakness.   PAST MEDICAL HISTORY:  Notable for coronary artery disease, CABG, a small  old CVA from 8 years ago.  The patient smoked a pack a day.  He has seizure  disorder, hypercholesterolemia and GERD.   PHYSICAL EXAMINATION:  VITAL SIGNS:  On admission was notable for a blood  pressure of 90/60.  CARDIOVASCULAR:  Tachycardia.  ABDOMEN:  Diffusely tender and nondistended.   INITIAL LABORATORY WORK:  BUN 20, creatinine 1, sodium 132, potassium 3.9,  INR 2.2, BNP 291 and cardiac enzymes were negative.   HOSPITAL COURSE:  The patient was rehydrated and continued on most of his  medications with the exception of his diuretic.  His Dilantin level was less  than 2.5, so he got an extra dose of Dilantin and his INR on the day of  discharge was up to 3.4, so his Coumadin was slightly decreased.   DISPOSITION:  The patient will be discharged on:  1.  Coumadin 5 mg daily and he is to get this rechecked.  2.  Aspirin 325  daily.  3.  Prevacid 30 daily.  4.  Dilantin ER 200 daily.  5.  Phenobarbital 100 daily.  6.  Zetia 10 daily.  7.  Digoxin 0.25 daily.  8.  Enalapril 20 daily.  9.  Lipitor 80 daily.  10. I have asked him to hold off on his Lasix 40 daily and potassium 20      daily for about a week or until he notices he is starting to get fluid,      whichever comes first.   He should be seen by Dr. Lysbeth Galas in 4 or 5 days and in a week, should have a  repeat INR, Dilantin and digoxin levels.  He should see Dr. Elease Hashimoto in about  2 weeks for follow up of his heart failure.      Jonna L. Robb Matar, M.D.  Electronically Signed     JLB/MEDQ  D:  05/05/2005  T:  05/05/2005  Job:  [id number]   cc:   [name], M.D.  Fax: [phone]   [name], M.D.  Fax: [phone]

## 2010-07-09 ENCOUNTER — Encounter: Payer: Medicare PPO | Admitting: *Deleted

## 2010-07-13 ENCOUNTER — Encounter: Payer: Self-pay | Admitting: *Deleted

## 2010-11-05 LAB — I-STAT 8, (EC8 V) (CONVERTED LAB)
Acid-Base Excess: 3 — ABNORMAL HIGH
Chloride: 104
Glucose, Bld: 103 — ABNORMAL HIGH
Hemoglobin: 15.6
Potassium: 4.4
Sodium: 136
TCO2: 31

## 2010-11-05 LAB — COMPREHENSIVE METABOLIC PANEL
ALT: 38
Albumin: 3.6
Alkaline Phosphatase: 89
Potassium: 4.5
Sodium: 140
Total Protein: 6.3

## 2010-11-05 LAB — PHENYTOIN LEVEL, TOTAL
Phenytoin Lvl: 13.2
Phenytoin Lvl: <2.5 — ABNORMAL LOW

## 2010-11-05 LAB — POCT CARDIAC MARKERS
CKMB, poc: 2.3
Myoglobin, poc: 177
Operator id: 272551
Troponin i, poc: <0.05

## 2010-11-05 LAB — PROTIME-INR
INR: 1.5
INR: 2.2 — ABNORMAL HIGH
INR: 2.5 — ABNORMAL HIGH
Prothrombin Time: 18.7 — ABNORMAL HIGH
Prothrombin Time: 24.6 — ABNORMAL HIGH
Prothrombin Time: 24.8 — ABNORMAL HIGH

## 2010-11-05 LAB — CK TOTAL AND CKMB (NOT AT ARMC)
CK, MB: 1.7
CK, MB: 2.8
Relative Index: 1.4
Relative Index: 2

## 2010-11-05 LAB — DIGOXIN LEVEL: Digoxin Level: 0.9

## 2010-11-05 LAB — BASIC METABOLIC PANEL
BUN: 14
BUN: 14
CO2: 27
CO2: 30
Calcium: 7.9 — ABNORMAL LOW
Calcium: 8.1 — ABNORMAL LOW
Calcium: 8.1 — ABNORMAL LOW
Chloride: 103
Creatinine, Ser: 0.69
Creatinine, Ser: 0.82
GFR calc Af Amer: >60
GFR calc Af Amer: >60
GFR calc non Af Amer: >60
GFR calc non Af Amer: >60
GFR calc non Af Amer: >60
Glucose, Bld: 85
Potassium: 3.9
Potassium: 4.2
Sodium: 136
Sodium: 138

## 2010-11-05 LAB — DIFFERENTIAL
Basophils Absolute: 0
Basophils Relative: 0
Lymphocytes Relative: 16
Monocytes Absolute: 0.6
Neutro Abs: 7.9 — ABNORMAL HIGH
Neutrophils Relative %: 78 — ABNORMAL HIGH

## 2010-11-05 LAB — CBC
HCT: 36.2 — ABNORMAL LOW
HCT: 36.5 — ABNORMAL LOW
Hemoglobin: 12.1 — ABNORMAL LOW
Hemoglobin: 12.2 — ABNORMAL LOW
Hemoglobin: 15
MCHC: 33.6
MCHC: 34.1
MCV: 97
Platelets: 136 — ABNORMAL LOW
Platelets: 145 — ABNORMAL LOW
Platelets: 187
RBC: 3.73 — ABNORMAL LOW
RBC: 3.74 — ABNORMAL LOW
RDW: 13.1
RDW: 13.4
WBC: 11.2 — ABNORMAL HIGH
WBC: 8.8
WBC: 9.2

## 2010-11-05 LAB — ETHANOL: Alcohol, Ethyl (B): <5

## 2010-11-05 LAB — PHENOBARBITAL LEVEL
Phenobarbital: 39.5
Phenobarbital: 53.7

## 2010-11-05 LAB — LIPID PANEL
LDL Cholesterol: 122 — ABNORMAL HIGH
Total CHOL/HDL Ratio: 6.2
Triglycerides: 176 — ABNORMAL HIGH
VLDL: 35

## 2010-11-05 LAB — TROPONIN I
Troponin I: 0.02
Troponin I: 0.03
Troponin I: 0.03

## 2010-11-05 LAB — B-NATRIURETIC PEPTIDE (CONVERTED LAB): Pro B Natriuretic peptide (BNP): 494 — ABNORMAL HIGH

## 2010-11-05 LAB — D-DIMER, QUANTITATIVE (NOT AT ARMC): D-Dimer, Quant: 4 — ABNORMAL HIGH

## 2010-11-05 LAB — POCT I-STAT CREATININE
Creatinine, Ser: 1.1
Operator id: 272551

## 2010-11-21 ENCOUNTER — Inpatient Hospital Stay (HOSPITAL_COMMUNITY)
Admission: EM | Admit: 2010-11-21 | Discharge: 2010-11-22 | DRG: 293 | Disposition: A | Discharge status: Home / Self Care | Payer: Medicare Other | Attending: Internal Medicine | Admitting: Internal Medicine

## 2010-11-21 ENCOUNTER — Emergency Department (HOSPITAL_COMMUNITY): Payer: Medicare Other

## 2010-11-21 DIAGNOSIS — K219 Gastro-esophageal reflux disease without esophagitis: Secondary | ICD-10-CM | POA: Diagnosis present

## 2010-11-21 DIAGNOSIS — I509 Heart failure, unspecified: Secondary | ICD-10-CM | POA: Diagnosis present

## 2010-11-21 DIAGNOSIS — F17200 Nicotine dependence, unspecified, uncomplicated: Secondary | ICD-10-CM | POA: Diagnosis present

## 2010-11-21 DIAGNOSIS — Z8673 Personal history of transient ischemic attack (TIA), and cerebral infarction without residual deficits: Secondary | ICD-10-CM

## 2010-11-21 DIAGNOSIS — I252 Old myocardial infarction: Secondary | ICD-10-CM

## 2010-11-21 DIAGNOSIS — Z7901 Long term (current) use of anticoagulants: Secondary | ICD-10-CM

## 2010-11-21 DIAGNOSIS — Z95810 Presence of automatic (implantable) cardiac defibrillator: Secondary | ICD-10-CM

## 2010-11-21 DIAGNOSIS — I2510 Atherosclerotic heart disease of native coronary artery without angina pectoris: Secondary | ICD-10-CM | POA: Diagnosis present

## 2010-11-21 DIAGNOSIS — Z951 Presence of aortocoronary bypass graft: Secondary | ICD-10-CM

## 2010-11-21 DIAGNOSIS — R079 Chest pain, unspecified: Secondary | ICD-10-CM

## 2010-11-21 DIAGNOSIS — Z79899 Other long term (current) drug therapy: Secondary | ICD-10-CM

## 2010-11-21 DIAGNOSIS — I5023 Acute on chronic systolic (congestive) heart failure: Principal | ICD-10-CM | POA: Diagnosis present

## 2010-11-21 DIAGNOSIS — I2589 Other forms of chronic ischemic heart disease: Secondary | ICD-10-CM | POA: Diagnosis present

## 2010-11-21 DIAGNOSIS — I4891 Unspecified atrial fibrillation: Secondary | ICD-10-CM | POA: Diagnosis present

## 2010-11-21 DIAGNOSIS — Z8249 Family history of ischemic heart disease and other diseases of the circulatory system: Secondary | ICD-10-CM

## 2010-11-21 DIAGNOSIS — J4489 Other specified chronic obstructive pulmonary disease: Secondary | ICD-10-CM | POA: Diagnosis present

## 2010-11-21 DIAGNOSIS — Z7982 Long term (current) use of aspirin: Secondary | ICD-10-CM

## 2010-11-21 DIAGNOSIS — E785 Hyperlipidemia, unspecified: Secondary | ICD-10-CM | POA: Diagnosis present

## 2010-11-21 DIAGNOSIS — I209 Angina pectoris, unspecified: Secondary | ICD-10-CM | POA: Diagnosis present

## 2010-11-21 DIAGNOSIS — J449 Chronic obstructive pulmonary disease, unspecified: Secondary | ICD-10-CM | POA: Diagnosis present

## 2010-11-21 DIAGNOSIS — G40909 Epilepsy, unspecified, not intractable, without status epilepticus: Secondary | ICD-10-CM | POA: Diagnosis present

## 2010-11-21 LAB — CARDIAC PANEL(CRET KIN+CKTOT+MB+TROPI)
Relative Index: INVALID (ref 0.0–2.5)
Total CK: 64 U/L (ref 7–232)
Troponin I: <0.3 ng/mL (ref <0.30)

## 2010-11-21 LAB — POCT I-STAT, CHEM 8
Calcium, Ion: 1.09 mmol/L — ABNORMAL LOW (ref 1.12–1.32)
Hemoglobin: 12.9 g/dL — ABNORMAL LOW (ref 13.0–17.0)
Sodium: 134 mEq/L — ABNORMAL LOW (ref 135–145)
TCO2: 24 mmol/L (ref 0–100)

## 2010-11-21 LAB — DIFFERENTIAL
Basophils Absolute: 0 10*3/uL (ref 0.0–0.1)
Basophils Relative: 0 % (ref 0–1)
Eosinophils Absolute: 0.2 10*3/uL (ref 0.0–0.7)
Monocytes Absolute: 0.9 10*3/uL (ref 0.1–1.0)
Monocytes Relative: 11 % (ref 3–12)
Neutro Abs: 5.3 10*3/uL (ref 1.7–7.7)
Neutrophils Relative %: 66 % (ref 43–77)

## 2010-11-21 LAB — CBC
Hemoglobin: 12.7 g/dL — ABNORMAL LOW (ref 13.0–17.0)
MCH: 32.8 pg (ref 26.0–34.0)
MCHC: 34.6 g/dL (ref 30.0–36.0)
Platelets: 159 10*3/uL (ref 150–400)
RBC: 3.87 MIL/uL — ABNORMAL LOW (ref 4.22–5.81)

## 2010-11-21 LAB — PROTIME-INR
INR: 1.57 — ABNORMAL HIGH (ref 0.00–1.49)
Prothrombin Time: 19.1 seconds — ABNORMAL HIGH (ref 11.6–15.2)

## 2010-11-21 LAB — POCT I-STAT TROPONIN I: Troponin i, poc: 0 ng/mL (ref 0.00–0.08)

## 2010-11-21 LAB — CK TOTAL AND CKMB (NOT AT ARMC): Relative Index: INVALID (ref 0.0–2.5)

## 2010-11-21 NOTE — H&P (Signed)
NAMEMarland [name]  ALMON, [name]:  [id number]  MEDICAL RECORD NO.:  [id number]  LOCATION:  [facility]                         FACILITY:  [facility]  PHYSICIAN:  Isidor Holts, M.D.  DATE OF BIRTH:  Apr 17, 1933  DATE OF ADMISSION:  11/21/2010 DATE OF DISCHARGE:                             HISTORY & PHYSICAL   PRIMARY CARDIOLOGIST:  [name], MD  PRIMARY CARDIOLOGIST:  [name], MD  PRIMARY EPS:  [name]. [name], MD  CHIEF COMPLAINT:  Chest pain and shortness of breath, early this a.m.  HISTORY OF PRESENT ILLNESS:  This is a 77 year old male. For past medical history see below.  The patient does have a rather complex cardiac history.  He is a good historian and according to him, he was perfectly fine all day on November 20, 2010.  In the night, however, he was sitting on the couch watching TV, when at about 1:00 a.m. on November 21, 2010, he developed gradual onset shortness of breath, which became progressively worse, followed by retrosternal chest pain, described as "sharp"/"pressure-like",  without any radiation.  He got dressed and called 911.  The EMS administered nebulizers en route, with improvement in shortness of breath.  On arrival in the emergency department, however, he was still having persisting chest pain, which resolved following sublingual nitroglycerin spray.  PAST MEDICAL HISTORY: 1. Coronary artery disease, status post MI x2 in 1989, status post     CABG. 2. Ischemic cardiomyopathy/chronic systolic congestive heart failure,     ejection fraction 20% to 25% per 2D echocardiogram on October 31, 2006, status post AICD. 3. Chronic atrial fibrillation, on anticoagulation. 4. Status post CVA in 1990, minimal deficit. 5. Mild lymphedema left lower extremity secondary to vein harvest for     CABG. 6. History of fall in November 2008 with left 8th and 9th rib     fractures. 7. Dyslipidemia. 8. GERD. 9. Smoking history. 10.Seizure  disorder. 11.COPD. 12.History of iron-deficiency anemia.  ALLERGIES:  DIOVAN.  MEDICATION HISTORY: 1. Warfarin 5 mg p.o. on Sunday, Monday, Wednesday, Thursday and     Friday, and 7.5 mg on Tuesday and Saturday. 2. Digoxin 250 mcg p.o. q.a.m. 3. Omeprazole 20 mg p.o. q. evening. 4. Carvedilol 12.5 mg p.o. b.i.d. 5. Integra Plus cap OTC 1 capsule p.o. q. evening. 6. Aspirin 325 mg p.o. q.a.m. 7. Phenytoin 100 mg p.o. q.a.m. and 200 mg p.o. at bedtime. 8. Potassium chloride 10 mEq p.o. b.i.d. 9. Furosemide 40 mg p.o. b.i.d. 10.Phenobarbital 97.2 mg tablet half tablet p.o. q.a.m. and 1 tab p.o.     q. Evening. 11.Enalapril 2.5 mg p.o. q.a.m.  REVIEW OF SYSTEMS:  As per HPI and chief complaint.  The patient denies abdominal pain, vomiting, or diarrhea.  Has regular bowel movements. Denies cough or sputum production.  Denies fever or chills.  Rest of systems review is negative.  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  He has 2 offspring, a son and a daughter who have not visited him for over 55  years.  Nondrinker. He has been smoking for over 60 years, initially 2 packets of cigarettes per day, but is now down to 1 pack of cigarettes per day. Denies drug abuse.  FAMILY HISTORY:  Both parents are deceased from heart attack.  The patient's father died at 33 years of age and his mother at 21 years of age.  PHYSICAL EXAMINATION:  VITAL SIGNS:  Temperature 97.8, pulse 70 per minute and regular, respiratory rate 12, BP 107/65 mmHg, and pulse oximetry 95% on 2 L of oxygen. GENERAL:  The patient did not appear to be in obvious acute discomfort at time of this evaluation.  No chest pain at the current time.  Not short of breath at rest. HEENT:  No clinical pallor.  No jaundice.  No conjunctival injection. Hydration status appears fair. NECK:  Supple.  JVP  not seen.  No palpable lymphadenopathy.  No palpable goiter. CHEST:  Bilateral basal end-inspiratory crackles, no wheezes.  Heart sounds 1 and 2 heard.  Normal and regular.  No murmurs.  AICD is noted in subcutaneous pouch.  Left upper chest appears unremarkable. ABDOMEN:  Full, soft, nontender.  No palpable organomegaly.  No palpable masses.  Normal bowel sounds. LOWER EXTREMITY:  The patient has varicose disease, however, no pitting edema.  Palpable peripheral pulses. MUSCULOSKELETAL SYSTEM:  Demonstrates generalized osteoarthritic changes, otherwise unremarkable. CENTRAL NERVOUS SYSTEM:  No focal neurologic deficit on gross examination.  INVESTIGATIONS:  CBC, WBC 8.1, hemoglobin 12.9, hematocrit 38.0, platelets 159.  Electrolytes, sodium 134, potassium 4.4, chloride 102, CO2 of 24, BUN 24, creatinine 0.90, glucose 113, troponin at point of care 0.00, BNP 2011.  Digoxin level 0.6.  Chest x-ray on November 21, 2010 shows stable postoperative changes.  There was cardiomegaly and mild interstitial edema.  A 12 lead EKG on November 21, 2010 shows atrial paced complexes.  ASSESSMENT AND PLAN: 1. Acute decompensation of chronic systolic congestive heart failure.     We shall manage the patient with intravenous Lasix. Because BP is     borderline low at the present time, we shall hold his ACE     inhibitor and Coreg, but will reintroduce his medications gingerly,     when feasible clinically.  2. Chest pain.  This has resolved following administration of     nitroglycerin spray in the emergency department.  This is likely     angina, secondary to acute decompensation of chronic systolic     congestive heart failure. For completeness, we will place on     telemetric monitoring and cycle cardiac enzymes.  3. Dyslipidemia.  The patient is not currently on statin. In view of     his known history of macrovascular disease, we shall start Crestor     in an initial dose of 10 mg p.o. daily.   Check lipid profile and     TSH.  4. Seizure disorder.  This has been asymptomatic according to the     patient, for the past 15 years.  We shall continue preadmission dose     of phenobarbital.  5. Chronic obstructive pulmonary disease.  The patient does not appear     to be in acute exacerbation.  We shall, however, manage     with bronchodilator nebulizers.  6. Smoking history.  The patient has been counseled appropriately.  We     shall manage with NicoDerm CQ patch during this hospitalization.  Note:  We shall consult Dr. Elease Hashimoto, his primary cardiologist in view  of his complex cardiac history.    Further management will depend on clinical course.     Isidor Holts, M.D.     CO/MEDQ  D:  11/21/2010  T:  11/21/2010  Job:  [id number]  cc:   [name], M.D. [name], M.D. [name]. [name], MD  Electronically Signed by Isidor Holts M.D. on 11/21/2010 04:28:09 PM

## 2010-11-22 DIAGNOSIS — I2510 Atherosclerotic heart disease of native coronary artery without angina pectoris: Secondary | ICD-10-CM

## 2010-11-22 LAB — LIPID PANEL
LDL Cholesterol: 137 mg/dL — ABNORMAL HIGH (ref 0–99)
Total CHOL/HDL Ratio: 5.6 RATIO

## 2010-11-22 LAB — BASIC METABOLIC PANEL
BUN: 23 mg/dL (ref 6–23)
Creatinine, Ser: 0.88 mg/dL (ref 0.50–1.35)
GFR calc Af Amer: >90 mL/min (ref >90)
GFR calc non Af Amer: 82 mL/min — ABNORMAL LOW (ref >90)

## 2010-11-22 LAB — CBC
HCT: 39.5 % (ref 39.0–52.0)
Hemoglobin: 13.6 g/dL (ref 13.0–17.0)
RDW: 12.9 % (ref 11.5–15.5)
WBC: 7.7 10*3/uL (ref 4.0–10.5)

## 2010-11-22 LAB — CARDIAC PANEL(CRET KIN+CKTOT+MB+TROPI)
CK, MB: 3.1 ng/mL (ref 0.3–4.0)
Troponin I: <0.3 ng/mL (ref <0.30)

## 2010-11-22 LAB — PRO B NATRIURETIC PEPTIDE: Pro B Natriuretic peptide (BNP): 2182 pg/mL — ABNORMAL HIGH (ref 0–450)

## 2010-11-22 LAB — PROTIME-INR
INR: 1.53 — ABNORMAL HIGH (ref 0.00–1.49)
Prothrombin Time: 18.7 seconds — ABNORMAL HIGH (ref 11.6–15.2)

## 2010-11-22 NOTE — Discharge Summary (Signed)
NAMEMarland Kitchen  [name], [name] NO.:  [id number]  MEDICAL RECORD NO.:  [id number]  LOCATION:  2001                         FACILITY:  [facility]  PHYSICIAN:  Isidor Holts, M.D.  DATE OF BIRTH:  01/23/1934  DATE OF ADMISSION:  11/21/2010 DATE OF DISCHARGE:  11/22/2010                              DISCHARGE SUMMARY   PRIMARY MD:  [name], MD  PRIMARY CARDIOLOGIST:  [name], MD  PRIMARY EPS:  [name]. [name], MD  DISCHARGE DIAGNOSES: 1. Mild acute decompensation of chronic systolic congestive heart     failure. 2. Chest pain, likely chronic stable angina. No acute coronary     syndrome. 3. History of coronary artery disease, status post myocardial     infarction x2, status post coronary artery bypass grafting. 4. Ischemic cardiomyopathy, ejection fraction 20% to 25% per 2D     echocardiogram of October 31, 2006, status post automatic     implantable cardioverter defibrillator. 5. History of cerebrovascular accident in 1993. 6. Chronic atrial fibrillation on chronic anticoagulation. 7. Smoking history. 8. Chronic obstructive pulmonary disease. 9. Dyslipidemia. 10.Gastroesophageal reflux disease. 11.Seizure disorder.  DISCHARGE MEDICATIONS: 1. Lasix 80 mg p.o. q.a.m. and 40 mg p.o. q.p.m. until November 25, 2010, then restart regular maintenance dose of Lasix i.e. 40 mg     p.o. b.i.d. from November 26, 2010. 2. Nitroglycerin spray (Nitro-Mist) 1 spray sublingually     p.r.n. q.5 minutes x3 doses for chest pain. 3. Crestor 10 mg p.o. daily. 4. Aspirin 325 mg p.o. q.a.m. 5. Carvedilol 12.5 mg p.o. b.i.d. 6. Digoxin 250 mcg p.o. q.a.m. 7. Enalapril 2.5 mg p.o. q.a.m. 8. Integra Plus cap OTC 1 capsule p.o. q. evening. 9. Omeprazole 20 mg p.o. q. evening. 10.Phenobarbital (97.2 mg) half tablet p.o. q.a.m. and one tablet p.o.     q. evening. 11.Phenytoin 100 mg p.o. q.a.m. and 100 mg p.o. q. evening and 200 mg     p.o. at bedtime. 12.Potassium  chloride 10 mEq p.o. b.i.d. 13.Coumadin per INR. Currently on 5 mg p.o. on Sundays, Mondays,     Wednesdays, Thursdays, and Fridays and 7.5 mg p.o. on Tuesdays and     Saturdays.  PROCEDURES:  Chest x-ray on November 21, 2010.  This showed cardiac enlargement with increased pulmonary vascularity suggesting developing congestive changes, mild interstitial edema.  CONSULTATIONS:  Hillis Range, MD, [place] cardiologist.  ADMISSION HISTORY:  As in H and P notes of November 21, 2010, dictated by this MD.  However, in brief, this is a 77 year old male, with known history of coronary artery disease status post MI x2 in 1989, status post CABG, ischemic cardiomyopathy/chronic systolic congestive heart failure, ejection fraction 20% to 25%, status post AICD, chronic atrial fibrillation on chronic anticoagulation, status post CVA in 1990 with minimal deficit, history of mild lymphedema left lower extremity secondary to vein harvesting for CABG, history of fall November 2008, complicated by left 8th and 9th rib fractures, dyslipidemia, GERD, smoking history, seizure disorder, COPD, history of iron-deficiency anemia, presenting with progressive shortness of breath, which commenced about 1 a.m. on November 21, 2010, followed by retrosternal chest pain.  He was nebulized en route  by the EMS, with improvement in his shortness of breath and chest pain, which was still persisting at the time of arrival in the emergency department, resolved with sublingual nitroglycerin spray.  The patient was admitted for further evaluation, investigation and management.  CLINICAL COURSE: 1. Acute decompensation of chronic systolic congestive heart failure.     The patient has a known history of ischemic cardiomyopathy, and as     a matter of fact, has had 2 previous MIs, is status post CABG and has     ejection fraction 20% to 25%.  The patient is status post AICD.  He     presents as described above.  Chest x-ray  demonstrated interstitial     edema.  He had bibasilar crackles on chest auscultation and BNP was     elevated at 2011.  The patient was managed with intravenous     diuretics, with satisfactory clinical response.  By November 20, 2010, he was no longer short of breath.  Cardiology consultation     was kindly provided by Dr. Hillis Range, who concurred with above-     mentioned management measures, but has slightly modified the     patient's diuretic regimen in the next couple of days.  2. Chest pain.  This is likely chronic stable angina, against a     background of CHF decompensation.  The patient's cardiac     enzymes were cycled, remained unelevated.  A 12-lead EKG showed     paced complexes.  As described above, the patient responded to     sublingual nitroglycerin spray and had no recurrence of chest pain     during the course of his hospitalization.  According to Dr. Johney Frame,     the patient will require medical management for the most part     going forward, however, he has recommended that should the patient     develop exertional symptoms prior to followup with Dr. Elease Hashimoto in 2     weeks, he may require an outpatient stress Myoview.  The patient     had no recurrence of symptoms, during the rest of the course of this     hospitalization.  3. Atrial fibrillation.  The patient is status post AICD.  He     exhibited paced complexes throughout the course of this     hospitalization.  4. Chronic anticoagulation.  This was continued under the supervision     of clinical pharmacologist.  5. Smoking history.  The patient does indeed continues to smoke quite     heavily.  He has been counseled appropriately and was managed with     NicoDerm CQ patch during the course of this hospitalization.  6. COPD, this appears stable without exacerbation, during this     hospitalization.  7. Dyslipidemia.  The patient's lipid profile was as follows: Total     cholesterol 218, triglycerides  210, HDL 39, LDL 107.  He has been     commenced on Crestor accordingly.  8. GERD, this did not prove problematic.  The patient continues on     proton pump inhibitor.  9. History of seizure disorder.  The patient confirms that his last     seizure episode was approximately 15 years ago.  He was continued     on preadmission anticonvulsants during the course of this     hospitalization and no seizures were recorded.  DISPOSITION:  The patient was considered clinically stable  for discharge on November 22, 2010, was cleared by cardiologist on that date for discharge and was discharged accordingly.  ACTIVITY:  As tolerated.  Recommended to increase activity slowly.  DIET:  Heart healthy.  FOLLOWUP INSTRUCTIONS:  The patient will follow up routinely with his primary MD, Dr. Joette Catching per prior scheduled appointment.  He will follow up with Dr. Kristeen Miss, his primary cardiologist in 2 weeks. Dr. Harvie Bridge office will call the patient to schedule an appointment. In addition, the patient will continue his prior scheduled followup appointment with Dr. Lewayne Bunting, his electrophysiologist.  All of this has been communicated to the patient, he has verbalized understanding.     Isidor Holts, M.D.     CO/MEDQ  D:  11/22/2010  T:  11/22/2010  Job:  [id number]  cc:   [name], M.D. [name], M.D. [name]. [name], MD  Electronically Signed by Isidor Holts M.D. on 11/22/2010 06:26:39 PM

## 2010-11-26 HISTORY — PX: CORONARY STENT PLACEMENT: SHX1402

## 2010-11-26 NOTE — Consult Note (Signed)
[name], AVINO NO.:  [id number]  MEDICAL RECORD NO.:  [id number]  LOCATION:  2001                         FACILITY:  [facility]  PHYSICIAN:  Hillis Range, MD       DATE OF BIRTH:  11/09/1933  DATE OF CONSULTATION:  11/21/2010 DATE OF DISCHARGE:                                CONSULTATION   REQUESTING PHYSICIAN:  Dr. Brien Few, with the hospitalist.  REASON FOR CONSULTATION:  Chest pain.  HISTORY OF PRESENT ILLNESS:  [name] is a pleasant 77 year old gentleman with a known history of coronary artery disease, status post prior CABG, ischemic cardiomyopathy (ejection fraction 20%), and atrial fibrillation, who is now admitted with chest pain and shortness of breath.  The patient reports being in his usual state of health, and quite active until yesterday afternoon.  He states that in the evening he ate a hamburger and then developed a substernal chest discomfort.  He reports progressive chest pain which increased to moderate in intensity with associated shortness of breath.  When the  symptoms did not resolve he called 911.  He states that he tried to take a nitroglycerin pill but that it "was old and grumble."  Upon EMS arrival, the patient received a single nitroglycerin spray as well as a nebulizer treatment.  He reports complete resolution in his chest pain and shortness of breath.  He has done well since that time.  Presently, he is resting comfortably and is without complaint.  He denies nausea vomiting arm/jaw pain, palpitations, dizziness, presyncope, syncope, or ICD shocks delivered. He reports compliance with his medicines.  Unfortunately continues to smoke.  He denies any change recently in his diet.  He reports that over the past 3 days he has been painting his ceiling at home.  He denies any exertional symptoms while painting.  At this time, he is without complaint and like to be discharged to home.  PAST MEDICAL HISTORY: 1. Coronary artery  disease, status post MI x2 in 1989 with subsequent     CABG. 2. Ischemic cardiomyopathy (ejection fraction 20-25% by echocardiogram     in 2008). 3. Status post [employer] ICD implantation by Dr. [name]. 4. Prior stroke with minimal deficit. 5. Atrial fibrillation for which he is chronically anticoagulated with     Coumadin. 6. GERD. 7. Ongoing tobacco use. 8. Hyperlipidemia. 9. COPD. 10.Seizure disorder. 11.History of iron-deficiency anemia.  MEDICATIONS:  Reviewed in the [facility] ([facility]  ALLERGIES:  DIOVAN.  SOCIAL HISTORY:  The patient lives in [place].  He is married, however, his wide resides out of town.  He continues to smoke 1 pack per day.  He denies alcohol or drug use.  FAMILY HISTORY:  Notable for coronary artery disease.  REVIEW OF SYSTEMS:  All systems reviewed and negative except as outlined in the HPI above.  PHYSICAL EXAMINATION:  TELEMETRY:  Reveals an atrial paced rhythm. VITAL SIGNS:  Blood pressure 105/54, heart rate 70, respirations 16, sats 96% on room air, afebrile. GENERAL:  The patient is a well-appearing male who is elderly but in no acute distress.  He is alert and oriented x3. HEENT:  Normocephalic and atraumatic.  Sclerae clear.  Conjunctivae pink.  Oropharynx clear. NECK:  Supple. LUNGS:  Bibasilar rales 1/3 way up. HEART:  Regular rate and rhythm.  No murmurs, rubs, or gallops. GI: Soft, nontender, nondistended.  Positive bowel sounds. EXTREMITIES:  No clubbing, cyanosis.  He does have trace edema. SKIN:  No ecchymoses or lacerations. MUSCULOSKELETAL:  No deformity or atrophy. PSYCH:  Euthymic mood.  Full affect.  DIAGNOSTIC STUDIES:  EKG reveals an atrial paced rhythm at 70 beats per minute.  PR interval 232 msec and incomplete left bundle-branch block. QT interval 430 msec.  LABS:  Cardiac markers are negative x2.  INR 1.5 digoxin 0.6.  BNP 2011. Hematocrit 36, platelets 159.  Chest x-ray reveals mild edema.  IMPRESSION:  [name] is a very  pleasant 77 year old gentleman with a known history of coronary artery disease as outlined above, and an ischemic cardiomyopathy.  He remains quite active with New York Heart Association class 2 congestive heart failure chronically.  He denies any recent episodes of angina or exertional symptoms prior last evening.  He is on a good medicine regimen.  At this time, he presents with chest discomfort which has now resolved.  He is presently resting comfortably and would like to go home.  RECOMMENDATIONS:  At this time, I think it is prudent to watch the patient overnight for rule out myocardial infarction with serial cardiac markers.  If his cardiac markers remained negative, then I would not recommend inpatient further risk stratification.  I think that he should follow up with Dr. Elease Hashimoto in the office in 2-4 weeks.  If he has had any further exertional symptoms at that time, then an outpatient Myoview may be reasonable option.  However, I think that the most prudent strategy going forward would be medical management.  He is continued on aspirin, Coreg, and Crestor.  Smoking cessation is also advised.  He is presently mildly volume overloaded and we will gently diurese the patient overnight.  If he remains stable in the morning, then I would recommend discharge as described above.  If he rules in for myocardial infarction, or has further chest discomfort, then we could entertain the idea further inpatient risk stratification at that time.  We will follow the patient with you.  Thanks for the consultation.     Hillis Range, MD     JA/MEDQ  D:  11/21/2010  T:  11/21/2010  Job:  [id number]  cc:   Isidor Holts, M.D. [name], M.D. [name]. [name], MD  Electronically Signed by Hillis Range MD on 11/26/2010 02:36:26 PM

## 2010-12-12 ENCOUNTER — Other Ambulatory Visit: Payer: Self-pay

## 2010-12-12 ENCOUNTER — Ambulatory Visit (HOSPITAL_COMMUNITY): Admit: 2010-12-12 | Payer: Self-pay | Admitting: Cardiovascular Disease

## 2010-12-12 ENCOUNTER — Encounter (HOSPITAL_COMMUNITY): Payer: Self-pay | Admitting: Cardiology

## 2010-12-12 ENCOUNTER — Inpatient Hospital Stay (HOSPITAL_COMMUNITY)
Admission: EM | Admit: 2010-12-12 | Discharge: 2010-12-16 | DRG: 248 | Disposition: A | Discharge status: Home / Self Care | Payer: Medicare Other | Attending: Cardiovascular Disease | Admitting: Cardiovascular Disease

## 2010-12-12 ENCOUNTER — Encounter (HOSPITAL_COMMUNITY)
Admission: EM | Disposition: A | Discharge status: Home / Self Care | Payer: Self-pay | Source: Home / Self Care | Attending: Cardiovascular Disease

## 2010-12-12 ENCOUNTER — Emergency Department (HOSPITAL_COMMUNITY): Payer: Medicare Other

## 2010-12-12 DIAGNOSIS — I252 Old myocardial infarction: Secondary | ICD-10-CM

## 2010-12-12 DIAGNOSIS — I4891 Unspecified atrial fibrillation: Secondary | ICD-10-CM | POA: Diagnosis present

## 2010-12-12 DIAGNOSIS — Z95810 Presence of automatic (implantable) cardiac defibrillator: Secondary | ICD-10-CM

## 2010-12-12 DIAGNOSIS — I2510 Atherosclerotic heart disease of native coronary artery without angina pectoris: Principal | ICD-10-CM | POA: Diagnosis present

## 2010-12-12 DIAGNOSIS — I200 Unstable angina: Secondary | ICD-10-CM | POA: Diagnosis present

## 2010-12-12 DIAGNOSIS — I509 Heart failure, unspecified: Secondary | ICD-10-CM | POA: Diagnosis present

## 2010-12-12 DIAGNOSIS — Z888 Allergy status to other drugs, medicaments and biological substances status: Secondary | ICD-10-CM

## 2010-12-12 DIAGNOSIS — I25810 Atherosclerosis of coronary artery bypass graft(s) without angina pectoris: Secondary | ICD-10-CM | POA: Diagnosis present

## 2010-12-12 DIAGNOSIS — J449 Chronic obstructive pulmonary disease, unspecified: Secondary | ICD-10-CM | POA: Diagnosis present

## 2010-12-12 DIAGNOSIS — Z8673 Personal history of transient ischemic attack (TIA), and cerebral infarction without residual deficits: Secondary | ICD-10-CM

## 2010-12-12 DIAGNOSIS — E785 Hyperlipidemia, unspecified: Secondary | ICD-10-CM | POA: Diagnosis present

## 2010-12-12 DIAGNOSIS — Z7902 Long term (current) use of antithrombotics/antiplatelets: Secondary | ICD-10-CM

## 2010-12-12 DIAGNOSIS — Z7901 Long term (current) use of anticoagulants: Secondary | ICD-10-CM

## 2010-12-12 DIAGNOSIS — I959 Hypotension, unspecified: Secondary | ICD-10-CM | POA: Diagnosis present

## 2010-12-12 DIAGNOSIS — I5023 Acute on chronic systolic (congestive) heart failure: Secondary | ICD-10-CM | POA: Diagnosis present

## 2010-12-12 DIAGNOSIS — I2589 Other forms of chronic ischemic heart disease: Secondary | ICD-10-CM | POA: Diagnosis present

## 2010-12-12 DIAGNOSIS — I2582 Chronic total occlusion of coronary artery: Secondary | ICD-10-CM | POA: Diagnosis present

## 2010-12-12 DIAGNOSIS — K219 Gastro-esophageal reflux disease without esophagitis: Secondary | ICD-10-CM | POA: Diagnosis present

## 2010-12-12 DIAGNOSIS — Z79899 Other long term (current) drug therapy: Secondary | ICD-10-CM

## 2010-12-12 DIAGNOSIS — F17200 Nicotine dependence, unspecified, uncomplicated: Secondary | ICD-10-CM | POA: Diagnosis present

## 2010-12-12 DIAGNOSIS — J4489 Other specified chronic obstructive pulmonary disease: Secondary | ICD-10-CM | POA: Diagnosis present

## 2010-12-12 HISTORY — DX: Atherosclerotic heart disease of native coronary artery without angina pectoris: I25.10

## 2010-12-12 HISTORY — PX: LEFT HEART CATHETERIZATION WITH CORONARY ANGIOGRAM: SHX5451

## 2010-12-12 HISTORY — DX: Cerebral infarction, unspecified: I63.9

## 2010-12-12 HISTORY — DX: Anemia, unspecified: D64.9

## 2010-12-12 HISTORY — DX: Unspecified atrial fibrillation: I48.91

## 2010-12-12 HISTORY — DX: Angina pectoris, unspecified: I20.9

## 2010-12-12 HISTORY — DX: Chronic obstructive pulmonary disease, unspecified: J44.9

## 2010-12-12 LAB — CBC
HCT: 43.5 % (ref 39.0–52.0)
Platelets: 178 10*3/uL (ref 150–400)
RDW: 12.5 % (ref 11.5–15.5)
WBC: 12.6 10*3/uL — ABNORMAL HIGH (ref 4.0–10.5)

## 2010-12-12 LAB — PRO B NATRIURETIC PEPTIDE: Pro B Natriuretic peptide (BNP): 3641 pg/mL — ABNORMAL HIGH (ref 0–450)

## 2010-12-12 LAB — DIFFERENTIAL
Basophils Absolute: 0 10*3/uL (ref 0.0–0.1)
Basophils Relative: 0 % (ref 0–1)
Lymphocytes Relative: 42 % (ref 12–46)
Monocytes Absolute: 0.9 10*3/uL (ref 0.1–1.0)
Neutro Abs: 6.2 10*3/uL (ref 1.7–7.7)
Neutrophils Relative %: 49 % (ref 43–77)

## 2010-12-12 LAB — POCT I-STAT TROPONIN I: Troponin i, poc: 0.05 ng/mL (ref 0.00–0.08)

## 2010-12-12 LAB — POCT I-STAT, CHEM 8
Calcium, Ion: 1 mmol/L — ABNORMAL LOW (ref 1.12–1.32)
HCT: 46 % (ref 39.0–52.0)
Hemoglobin: 15.6 g/dL (ref 13.0–17.0)
Sodium: 138 mEq/L (ref 135–145)
TCO2: 25 mmol/L (ref 0–100)

## 2010-12-12 LAB — APTT: aPTT: 32 seconds (ref 24–37)

## 2010-12-12 LAB — PROTIME-INR
INR: 2.03 — ABNORMAL HIGH (ref 0.00–1.49)
Prothrombin Time: 23.3 seconds — ABNORMAL HIGH (ref 11.6–15.2)

## 2010-12-12 LAB — GLUCOSE, CAPILLARY: Glucose-Capillary: 267 mg/dL — ABNORMAL HIGH (ref 70–99)

## 2010-12-12 SURGERY — LEFT HEART CATHETERIZATION WITH CORONARY ANGIOGRAM
Anesthesia: LOCAL

## 2010-12-12 MED ORDER — ASPIRIN 325 MG PO TABS: 325.0000 mg | ORAL_TABLET | Freq: Every day | ORAL | Status: DC

## 2010-12-12 MED ORDER — ACETAMINOPHEN 325 MG PO TABS
650.0000 mg | ORAL_TABLET | ORAL | Status: DC | PRN
  Filled 2010-12-12: qty 2

## 2010-12-12 MED ORDER — DIGOXIN 250 MCG PO TABS
250.0000 ug | ORAL_TABLET | Freq: Every day | ORAL | Status: DC
  Administered 2010-12-13 – 2010-12-16 (×4): 250 ug via ORAL
  Filled 2010-12-12 (×4): qty 1

## 2010-12-12 MED ORDER — FENTANYL CITRATE 0.05 MG/ML IJ SOLN
INTRAMUSCULAR | Status: AC
  Filled 2010-12-12: qty 2

## 2010-12-12 MED ORDER — ONDANSETRON HCL 4 MG/2ML IJ SOLN: 4.0000 mg | Freq: Four times a day (QID) | INTRAMUSCULAR | Status: DC | PRN

## 2010-12-12 MED ORDER — PHENYTOIN SODIUM EXTENDED 100 MG PO CAPS
200.0000 mg | ORAL_CAPSULE | Freq: Every day | ORAL | Status: DC
  Administered 2010-12-13 – 2010-12-15 (×4): 200 mg via ORAL
  Filled 2010-12-12 (×5): qty 2

## 2010-12-12 MED ORDER — NITROGLYCERIN 0.2 MG/ML ON CALL CATH LAB
INTRAVENOUS | Status: AC
  Filled 2010-12-12: qty 1

## 2010-12-12 MED ORDER — NITROGLYCERIN IN D5W 200-5 MCG/ML-% IV SOLN
INTRAVENOUS | Status: AC
  Administered 2010-12-12: 10 ug via INTRAVENOUS
  Filled 2010-12-12: qty 250

## 2010-12-12 MED ORDER — MORPHINE SULFATE 4 MG/ML IJ SOLN
INTRAMUSCULAR | Status: AC
  Administered 2010-12-12: 4 mg
  Filled 2010-12-12: qty 1

## 2010-12-12 MED ORDER — PHENOBARBITAL 32.4 MG PO TABS
48.6000 mg | ORAL_TABLET | Freq: Every day | ORAL | Status: DC
  Administered 2010-12-13: 48.6 mg via ORAL
  Filled 2010-12-12: qty 1
  Filled 2010-12-12 (×2): qty 2

## 2010-12-12 MED ORDER — ACETAMINOPHEN 325 MG PO TABS
650.0000 mg | ORAL_TABLET | ORAL | Status: DC | PRN
  Administered 2010-12-13 – 2010-12-14 (×5): 650 mg via ORAL
  Filled 2010-12-12 (×4): qty 2

## 2010-12-12 MED ORDER — FUROSEMIDE 10 MG/ML IJ SOLN
80.0000 mg | Freq: Once | INTRAMUSCULAR | Status: AC
  Administered 2010-12-12: 80 mg via INTRAVENOUS
  Filled 2010-12-12: qty 8

## 2010-12-12 MED ORDER — FOLIVANE-PLUS PO CAPS: 1.0000 | ORAL_CAPSULE | Freq: Every day | ORAL | Status: DC

## 2010-12-12 MED ORDER — FE FUMARATE-B12-VIT C-FA-IFC PO CAPS
1.0000 | ORAL_CAPSULE | Freq: Every day | ORAL | Status: DC
  Administered 2010-12-13 – 2010-12-16 (×4): 1 via ORAL
  Filled 2010-12-12 (×5): qty 1

## 2010-12-12 MED ORDER — LIDOCAINE HCL (PF) 1 % IJ SOLN
INTRAMUSCULAR | Status: AC
  Filled 2010-12-12: qty 30

## 2010-12-12 MED ORDER — PHENOBARBITAL 97.2 MG PO TABS: 97.2000 mg | ORAL_TABLET | ORAL | Status: DC

## 2010-12-12 MED ORDER — NITROGLYCERIN 0.4 MG SL SUBL: 0.4000 mg | SUBLINGUAL_TABLET | SUBLINGUAL | Status: DC | PRN

## 2010-12-12 MED ORDER — FUROSEMIDE 10 MG/ML IJ SOLN: 60.0000 mg | Freq: Once | INTRAMUSCULAR | Status: DC

## 2010-12-12 MED ORDER — PHENYTOIN SODIUM EXTENDED 100 MG PO CAPS
100.0000 mg | ORAL_CAPSULE | Freq: Every day | ORAL | Status: DC
  Administered 2010-12-13 – 2010-12-16 (×4): 100 mg via ORAL
  Filled 2010-12-12 (×4): qty 1

## 2010-12-12 MED ORDER — FUROSEMIDE 10 MG/ML IJ SOLN
40.0000 mg | Freq: Two times a day (BID) | INTRAMUSCULAR | Status: DC
  Administered 2010-12-13: 40 mg via INTRAVENOUS
  Filled 2010-12-12 (×6): qty 4

## 2010-12-12 MED ORDER — CARVEDILOL 12.5 MG PO TABS
12.5000 mg | ORAL_TABLET | Freq: Two times a day (BID) | ORAL | Status: DC
  Administered 2010-12-14 – 2010-12-16 (×5): 12.5 mg via ORAL
  Filled 2010-12-12 (×9): qty 1

## 2010-12-12 MED ORDER — ASPIRIN 81 MG PO CHEW
81.0000 mg | CHEWABLE_TABLET | Freq: Every day | ORAL | Status: DC
  Administered 2010-12-13 – 2010-12-14 (×2): 81 mg via ORAL
  Filled 2010-12-12 (×2): qty 1

## 2010-12-12 MED ORDER — MORPHINE SULFATE 10 MG/ML IJ SOLN
INTRAMUSCULAR | Status: AC
  Filled 2010-12-12: qty 1

## 2010-12-12 MED ORDER — SODIUM CHLORIDE 0.9 % IV SOLN
0.2500 mg/kg/h | INTRAVENOUS | Status: AC
  Administered 2010-12-12 – 2010-12-13 (×2): 0.25 mg/kg/h via INTRAVENOUS
  Filled 2010-12-12: qty 250

## 2010-12-12 MED ORDER — BIVALIRUDIN 250 MG IV SOLR
INTRAVENOUS | Status: AC
  Filled 2010-12-12: qty 250

## 2010-12-12 MED ORDER — HEPARIN (PORCINE) IN NACL 2-0.9 UNIT/ML-% IJ SOLN
INTRAMUSCULAR | Status: AC
  Filled 2010-12-12: qty 2000

## 2010-12-12 MED ORDER — SODIUM CHLORIDE 0.9 % IV SOLN
1.0000 mL/kg/h | INTRAVENOUS | Status: AC
  Administered 2010-12-12 – 2010-12-13 (×3): 1 mL/kg/h via INTRAVENOUS

## 2010-12-12 MED ORDER — ENALAPRIL MALEATE 2.5 MG PO TABS
2.5000 mg | ORAL_TABLET | Freq: Every day | ORAL | Status: DC
  Administered 2010-12-13: 2.5 mg via ORAL
  Filled 2010-12-12 (×2): qty 1

## 2010-12-12 MED ORDER — PHENOBARBITAL 32.4 MG PO TABS
97.2000 mg | ORAL_TABLET | Freq: Every day | ORAL | Status: DC
  Administered 2010-12-13 (×2): 97.2 mg via ORAL
  Filled 2010-12-12: qty 3

## 2010-12-12 MED ORDER — CLOPIDOGREL BISULFATE 75 MG PO TABS
75.0000 mg | ORAL_TABLET | Freq: Every day | ORAL | Status: DC
  Administered 2010-12-13 – 2010-12-14 (×2): 75 mg via ORAL
  Filled 2010-12-12 (×3): qty 1

## 2010-12-12 MED ORDER — MORPHINE SULFATE 2 MG/ML IJ SOLN: 2.0000 mg | INTRAMUSCULAR | Status: DC | PRN

## 2010-12-12 MED ORDER — OMEPRAZOLE MAGNESIUM 20 MG PO TBEC: 20.0000 mg | DELAYED_RELEASE_TABLET | Freq: Every day | ORAL | Status: DC

## 2010-12-12 MED ORDER — MORPHINE SULFATE 4 MG/ML IJ SOLN
2.0000 mg | INTRAMUSCULAR | Status: DC | PRN
  Administered 2010-12-12 – 2010-12-13 (×2): 3 mg via INTRAVENOUS
  Filled 2010-12-12 (×2): qty 1

## 2010-12-12 MED ORDER — POTASSIUM CHLORIDE CRYS ER 10 MEQ PO TBCR
10.0000 meq | EXTENDED_RELEASE_TABLET | Freq: Two times a day (BID) | ORAL | Status: DC
  Administered 2010-12-13 – 2010-12-16 (×7): 10 meq via ORAL
  Filled 2010-12-12 (×8): qty 1

## 2010-12-12 MED ORDER — NITROGLYCERIN IN D5W 200-5 MCG/ML-% IV SOLN: 3.0000 ug/min | INTRAVENOUS | Status: DC

## 2010-12-12 MED ORDER — NITROGLYCERIN IN D5W 200-5 MCG/ML-% IV SOLN: 2.0000 ug/min | Freq: Once | INTRAVENOUS | Status: DC

## 2010-12-12 MED ORDER — MIDAZOLAM HCL 2 MG/2ML IJ SOLN
INTRAMUSCULAR | Status: AC
  Filled 2010-12-12: qty 2

## 2010-12-12 NOTE — ED Notes (Addendum)
Cath lab at bedside and ready to transport to cath lab; pt transported with 2 RNs, RT, and cath lab staff; pt on zoll and portable monitor from room; wife at bedside

## 2010-12-12 NOTE — ED Provider Notes (Signed)
History     CSN: [id number] Arrival date & time: 12/12/2010  7:53 PM   None     Chief Complaint  Patient presents with  . Chest Pain    (Consider location/radiation/quality/duration/timing/severity/associated sxs/prior treatment) Patient is a 77 y.o. male presenting with chest pain. The history is provided by the patient. The history is limited by the condition of the patient.  Chest Pain The chest pain began less than 1 hour ago. Chest pain occurs constantly. The chest pain is worsening. The pain is associated with exertion. The severity of the pain is moderate. The quality of the pain is described as pressure-like and tightness. The pain does not radiate. Primary symptoms include shortness of breath and nausea. Pertinent negatives for primary symptoms include no fever, no cough, no palpitations, no vomiting and no altered mental status.  Pertinent negatives for associated symptoms include no claudication and no orthopnea. He tried aspirin and nitroglycerin for the symptoms.     Past Medical History  Diagnosis Date  . Tobacco use disorder   . Esophageal reflux   . Unspecified cerebral artery occlusion with cerebral infarction   . Other and unspecified hyperlipidemia   . Other convulsions   . Postsurgical aortocoronary bypass status   . Other malaise and fatigue   . Cardiac pacemaker in situ   . Sinoatrial node dysfunction   . Congestive heart failure, unspecified   . Other specified forms of chronic ischemic heart disease     No past surgical history on file.  Family History  Problem Relation Age of Onset  . Heart disease Mother   . Heart disease Father     History  Substance Use Topics  . Smoking status: Current Everyday Smoker -- 1.0 packs/day for 40 years  . Smokeless tobacco: Not on file   Comment: Started on nicotine patch  . Alcohol Use: No      Review of Systems  Unable to perform ROS Constitutional: Negative for fever.  Respiratory: Positive for  shortness of breath. Negative for cough.   Cardiovascular: Positive for chest pain. Negative for palpitations, orthopnea and claudication.  Gastrointestinal: Positive for nausea. Negative for vomiting.  Psychiatric/Behavioral: Negative for altered mental status.    Allergies  Valsartan  Home Medications   Current Outpatient Rx  Name Route Sig Dispense Refill  . ASPIRIN 325 MG PO TABS Oral Take 325 mg by mouth daily.      Marland Kitchen CARVEDILOL 12.5 MG PO TABS Oral Take 12.5 mg by mouth 2 (two) times daily with a meal.      . DIGOXIN 0.25 MG PO TABS Oral Take 250 mcg by mouth daily.      . ENALAPRIL MALEATE 2.5 MG PO TABS Oral Take 2.5 mg by mouth daily.      . FUROSEMIDE 40 MG PO TABS Oral Take 40 mg by mouth 2 (two) times daily.      Marland Kitchen OMEPRAZOLE MAGNESIUM 20 MG PO TBEC Oral Take 20 mg by mouth daily.      Marland Kitchen PHENOBARBITAL 97.2 MG PO TABS Oral Take 97.2 mg by mouth as directed. 1/2 tab in AM 1 tab in PM     . PHENYTOIN SODIUM EXTENDED 100 MG PO CAPS Oral Take by mouth as directed. 1 tab in morning 1 tab at noon 2 tabs at night     . POTASSIUM CHLORIDE 10 MEQ PO TBCR Oral Take 10 mEq by mouth daily.      Marland Kitchen SIMVASTATIN 80 MG PO TABS Oral Take  80 mg by mouth at bedtime.      . WARFARIN SODIUM 5 MG PO TABS Oral Take 5 mg by mouth as directed.        BP 127/76  Pulse 113  Resp 26  Ht 5\' 9"  (1.753 m)  Wt 154 lb (69.854 kg)  BMI 22.74 kg/m2  SpO2 93%  Physical Exam  Nursing note and vitals reviewed. Constitutional: He is oriented to person, place, and time. He appears well-developed. He appears distressed.  HENT:  Head: Normocephalic and atraumatic.  Eyes: Conjunctivae are normal. Pupils are equal, round, and reactive to light.  Neck: Normal range of motion. Neck supple.  Cardiovascular: Normal rate, regular rhythm and normal heart sounds.   Pulmonary/Chest: He is in respiratory distress. He has rales.  Abdominal: Soft. There is no tenderness.  Musculoskeletal: Normal range of motion. He  exhibits no edema and no tenderness.  Neurological: He is alert and oriented to person, place, and time. No cranial nerve deficit. Coordination normal.  Skin: Skin is warm and dry.    ED Course  Procedures (including critical care time)  Labs Reviewed  POCT I-STAT, CHEM 8 - Abnormal; Notable for the following:    BUN 34 (*)    Creatinine, Ser 1.50 (*)    Glucose, Bld 285 (*)    Calcium, Ion 1.00 (*)    All other components within normal limits  GLUCOSE, CAPILLARY - Abnormal; Notable for the following:    Glucose-Capillary 267 (*)    All other components within normal limits  I-STAT, CHEM 8  CBC  DIFFERENTIAL  PROTIME-INR  APTT  I-STAT TROPONIN I  PRO B NATRIURETIC PEPTIDE  I-STAT TROPONIN I   Dg Chest Portable 1 View  12/12/2010  *RADIOLOGY REPORT*  Clinical Data: Chest pain.  Shortness of breath.  Coronary artery disease.  PORTABLE CHEST - 1 VIEW  Comparison: 11/21/2010  Findings: Moderate cardiomegaly stable.  Moderate diffuse pulmonary interstitial edema shows no significant change.  No evidence of pulmonary consolidation or pleural effusion.  AICD remains in place.  Previous CABG again noted.  IMPRESSION: Mild to moderate congestive heart failure, without significant change.  Original Report Authenticated By: Danae Orleans, M.D.     No diagnosis found.    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  Similar pain with previous MI.  REcieved nitro and ASA by EMS.  EKG showed paced rhythm, due to how bad pt appeared consulted cards immediately.  They took pt to cath lab.          Bobby Alexander, MD 12/12/10 2137

## 2010-12-12 NOTE — ED Provider Notes (Signed)
See prior note  Callee Rohrig L Sole Lengacher, MD 12/12/10 2334 

## 2010-12-12 NOTE — Op Note (Signed)
Emergent Cardiac Catherization/PCI  [name], 77 y.o., male  DICTATION [phone] , [phone]  [facility] A, 12/12/2010, 10:44 PM

## 2010-12-12 NOTE — ED Provider Notes (Signed)
He relates he had MI about 3 weeks ago. He does not know the name of his cardiologist. 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 He's had nausea and some vomiting. EMS has several EKGs but mainly show a paced rhythm with a bundle branch block. Patient noted to be very diaphoretic and cold to touch. He has diffuse rales in his lungs and some audible rales. Cardiovascular exam shows tachycardia. He was also noted to be pale.  He is started on nitroglycerin drip, he was given morphine for pain. He was also given IV Lasix and started on BiPAP for his respiratory distress  EKG Ventricular paced rhythm at 112 BPM No old tracing.    I saw and evaluated the patient, reviewed the resident's note and I agree with the findings and plan. Devoria Albe, MD, Armando Gang   Ward Givens, MD 12/12/10 2322

## 2010-12-12 NOTE — Cardiovascular Report (Signed)
[name], [name]:  [id number]  MEDICAL RECORD NO.:  [id number]  LOCATION:  2904                         FACILITY:  [facility]  PHYSICIAN:  [name], M.D.     DATE OF BIRTH:  1933/05/28  DATE OF PROCEDURE:  12/12/2010 DATE OF DISCHARGE:                           CARDIAC CATHETERIZATION   PROCEDURE:  Emergent cardiac catheterization:  Cine coronary angiography; selective angiography into saphenous vein grafts; selective angiography into left internal mammary artery; left ventriculography; percutaneous coronary intervention with PTCA stenting of a dominant left circumflex coronary artery.  INDICATIONS:  [name] is a 77 year old gentleman who has a history of an ischemic cardiomyopathy.  He apparently has a remote history of CVA, history of myocardial infarction x2, prior CABG revascularization surgery in 1998, and he also is status post ICD implantation.  He has a history of atrial fibrillation and has been on Coumadin therapy.  There is also history of GERD as well as questionable seizure disorder in addition to ongoing tobacco use.  Apparently, this evening the patient was getting a beer and developed significant chest pressure.  EMS was called.  The patient does have a paced rhythm, and there was question of ST-segment elevation myocardial infarction although this was indeterminable secondary to the paced rhythm. However, upon presentation to [facility] Emergency Room, the patient was still having 10/10 chest pain.  Initial troponin was normal.  The patient was evaluated by Dr. Armanda [name] and due to ongoing chest pain definitive cardiac catheterization was recommended and I was called in to do the procedure.  PROCEDURE:  Upon arrival to the catheterization laboratory in the emergency room, the patient had had a Foley in place.  He had received 80 mg of Lasix.  He also did receive two morphine.  Right femoral artery was punctured anteriorly  and a 6-French sheath was inserted without difficulty.  Diagnostic catheterization was done utilizing 6-French Judkins 4 diagnostic catheters.  The right catheter was also used for selective angiography into the vein graft as well as into the left internal mammary artery.  A 6-French pigtail catheter was used for RAO ventriculography.  With the demonstration of high-grade distal circumflex stenosis not supplied by the graft, the decision was made to perform intervention on this vessel.  Apparently, the patient had been on chronic Coumadin.  He had not taken his Coumadin today.  His last dose of Coumadin was yesterday.  Angiomax was administered.  ACT was documented to be therapeutic.  The patient also received 300 mg of Plavix during the procedure and a 6-French XB 3.5 guide was used for the intervention.  A Prowater wire was able to be advanced down into the dominant distal circumflex.  Pre dilatation was done with a 2.5 x 12 mm Trek balloon.  A 3.0 x15 mm bare-metal Vision stent was then successfully deployed up to 14 atmospheres x2.  A 3.5 x 8 mm noncompliant Trek was used for post-stent dilatation with stent taper from approximately 3.55 to 3.48.  Scout angiography confirmed an excellent angiographic result.  The patient tolerated the procedure well.  He will be maintained on low dose Angiomax.  He left the catheterization laboratory with  stable hemodynamics with the arterial sheath sutured in place.  HEMODYNAMIC DATA:  Central aortic pressure is 106/67, left ventricular pressure 106/26, post A-wave 40.  ANGIOGRAPHIC DATA:  Left main coronary artery was angiographically normal and bifurcated into an LAD and dominant left circumflex coronary artery.  The LAD had proximal 50-60% narrowing before prominent septal perforating artery.  Flush and fill phenomena was seen in the LAD.  The circumflex vessel was a dominant vessel.  There was 50% proximal stenosis.  There was 70% stenosis  in the proximal OM 1 vessel, which was bypassed by the graft.  In what appeared to be good distal AV groove portion before the PDA and PLA takeoff at the site of another small marginal branch, there was now an eccentric shelf with 90% eccentric stenosis.  The right coronary artery was a small caliber angiographically normal nondominant vessel.  The most superior graft supplied the OM 1 vessel.  There was mild 20% narrowing in the proximal third of the graft.  The OM vessel beyond the graft insertion was free of significant disease.  The second graft was totally occluded at its origin.  It was uncertain where this graft reached but it is possible it may have reached the more distal marginal vessel, which was occluded.  The LIMA graft was widely patent and anastomosed into a small caliber mid distal LAD system.  RAO ventriculography revealed a markedly dilated left ventricle with severe global hypokinesis and an ejection fraction of 10-15%.  There was significant hypo to akinesis of the inferior wall.  Following successful PTCA and stenting of the distal circumflex coronary artery, with ultimate insertion of a 3.0 x15 mm bare-metal Vision stent post-dilated to 3.55 mm tapering to 3.48 mm the high-grade distal circumflex stenoses were reduced to 0%.  There was brisk TIMI-3 flow. There was improvement in the patient's chest discomfort.  IMPRESSION: 1. Acute coronary syndrome most likely secondary to high-grade distal     arteriovenous groove circumflex stenosis in a dominant left     circumflex system. 2. Significant native coronary artery disease with 60% proximal left     anterior descending  stenosis for prominent septal perforating     artery. 3. Dominant left circumflex coronary artery with 50% proximal     stenosis, 70-80% stenosis in the proximal portion of the marginal 1     vessel, 90% eccentric stenosis in the region of a small and more     distal marginal vessel, which  may be occluded. 4. Small nondominant right coronary artery. 5. Patent left internal mammary artery to the left anterior     descending. 6. Patent vein graft to the obtuse marginal 1 vessel with mild 20%     narrowing in the proximal third of the graft. 7. Occluded vein graft, with uncertain destination but possibly     supplying a distal circumflex marginal vessel. 8. Successful percutaneous coronary intervention to the distal     atrioventricular groove circumflex with ultimate percutaneous     transluminal coronary angioplasty and stenting and insertion of a     3.0 x15 mm bare-metal Vision stent, post dilated to 3.55 tapering     to 3.48 mm done with bivalirudin/Plavix/intracoronary     nitroglycerin.          ______________________________ [name], M.D.    TK/MEDQ  D:  12/12/2010  T:  12/12/2010  Job:  [id number]  cc:   [name], M.D.

## 2010-12-12 NOTE — ED Notes (Signed)
Pt from [place]/ c/o CP, diaphoretic and has been syncopal

## 2010-12-12 NOTE — H&P (Signed)
Admit date: 12/12/2010 Referring Physician Dr. Lynelle Doctor Primary Cardiologist Dr. Elease Hashimoto Chief complaint/reason for admission:Chest pain   HPI: The patient presents to the ER with complaints of Chest pain. 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  He took one of his NTG without relief and called EMS.  On arrival of EMS he was given SL NTG x2 and ASA.  His EKG shows NSR with paced rhythm.  He currently is on BiPAP and complaining of 10/10 chest pain with SOB.       PMH:    Past Medical History  Diagnosis Date  . Tobacco use disorder   . Esophageal reflux   . Unspecified cerebral artery occlusion with cerebral infarction   . Other and unspecified hyperlipidemia   . Other convulsions   . Postsurgical aortocoronary bypass status   . Other malaise and fatigue   . Cardiac pacemaker in situ   . Other specified forms of chronic ischemic heart disease   . Coronary artery disease     MI x2 in 1989 with subsequent CABG  . Congestive heart failure, unspecified     Ischemic cardiomyopathy EF 20-25%  . Myocardial infarction 1989  . Sinoatrial node dysfunction   . Atrial fibrillation   . Stroke   . Angina   . COPD (chronic obstructive pulmonary disease)   . Anemia     PSH:    Past Surgical History  Procedure Date  . Coronary artery bypass graft   . Cardiac catheterization   . Insert / replace / remove pacemaker     AICD 2008    ALLERGIES:   Valsartan  Prior to Admit Meds:   (Not in a hospital admission) Family HX:    Family History  Problem Relation Age of Onset  . Heart disease Mother   . Heart disease Father    Social HX:    History   Social History  . Marital Status: Married    Spouse Name: N/A    Number of Children: N/A  . Years of Education: N/A   Occupational History  . Not on file.   Social History Main Topics  . Smoking status: Current Everyday Smoker -- 1.0  packs/day for 60 years    Types: Cigarettes  . Smokeless tobacco: Not on file   Comment: Started on nicotine patch  . Alcohol Use: No  . Drug Use: No  . Sexually Active: Not on file   Other Topics Concern  . Not on file   Social History Narrative  . No narrative on file     ROS:  All 11 ROS were addressed and are negative except what is stated in the HPI  PHYSICAL EXAM Filed Vitals:   12/12/10 2021  BP: 110/79  Pulse: 98  Temp: 96.4 F (35.8 C)  Resp: 20   General: Well developed, well nourished, in no acute distress Head: Eyes PERRLA, No xanthomas.   Normal cephalic and atramatic  Lungs:   Crackles bilaterally at bases. Heart:   HRRR S1 S2 Pulses are 2+ & equal.            No carotid bruit. No JVD.  No abdominal bruits. No femoral bruits. Abdomen: Bowel sounds are positive, abdomen soft and non-tender without masses \\Extremities :   No clubbing, cyanosis or edema.  DP +1 Neuro: Alert and oriented X 3. Psych:  Good affect, responds appropriately  Labs:   Lab Results  Component Value Date   WBC 12.6* 12/12/2010   HGB 15.6 12/12/2010   HCT 46.0 12/12/2010   MCV 97.5 12/12/2010   PLT 178 12/12/2010    Lab 12/12/10 2003  NA 138  K 4.3  CL 104  CO2 --  BUN 34*  CREATININE 1.50*  CALCIUM --  PROT --  BILITOT --  ALKPHOS --  ALT --  AST --  GLUCOSE 285*   Lab Results  Component Value Date   CKTOTAL 53 11/21/2010   CKMB 3.1 11/21/2010   TROPONINI <0.30 11/21/2010    Lab Results  Component Value Date   INR 2.03* 12/12/2010   INR 1.53* 11/22/2010   INR 1.57* 11/21/2010     Lab Results  Component Value Date   CHOL 218* 11/22/2010   CHOL  Value: 187        ATP III CLASSIFICATION:  <200     mg/dL   Desirable  161-096  mg/dL   Borderline High  >=045    mg/dL   High 40/09/8117   Lab Results  Component Value Date   HDL 39* 11/22/2010   HDL 30* 10/29/2006   Lab Results  Component Value Date   LDLCALC 137* 11/22/2010   LDLCALC  Value: 122         Total Cholesterol/HDL:CHD Risk Coronary Heart Disease Risk Table                     Men   Women  1/2 Average Risk   3.4   3.3* 10/29/2006   Lab Results  Component Value Date   TRIG 210* 11/22/2010   TRIG 176* 10/29/2006   Lab Results  Component Value Date   CHOLHDL 5.6 11/22/2010   CHOLHDL 6.2 10/29/2006       Radiology:  *RADIOLOGY REPORT*  Clinical Data: Chest pain. Shortness of breath. Coronary artery  disease.  PORTABLE CHEST - 1 VIEW  Comparison: 11/21/2010  Findings: Moderate cardiomegaly stable. Moderate diffuse pulmonary  interstitial edema shows no significant change. No evidence of  pulmonary consolidation or pleural effusion.  AICD remains in place. Previous CABG again noted.  IMPRESSION:  Mild to moderate congestive heart failure, without significant  change.  Original Report Authenticated By: Danae Orleans, M.D.   EKG:  NSR with V paced  ASSESSMENT:  1.  Unstable angina with ongoing 10/10 CP despite IV NTG 2.  CAD s/p remote CABG 3.  Acute on chronic CHF 4.  Ischemic DCM EF 20-25% s/p AICD 5.  Atrial fibrilation 6.  Systemic anticoagulation 7.  COPD with ongoing tobacco abuse  PLAN:   1.  Emergent Cath due to ongoing severe chest pain despite IV NTG - discussed with Dr. Tresa Endo 2.  IV Lasix 80mg  given in ER - will start 40mg  IV BID 3.  Hold Coumadin 4.  Cycle cardiac enzymes q8 hours x 3 5.  BMET, TSH, BNP in am   Birdell Frasier R  12/12/2010  8:38 PM

## 2010-12-13 ENCOUNTER — Encounter (HOSPITAL_COMMUNITY): Payer: Self-pay | Admitting: *Deleted

## 2010-12-13 ENCOUNTER — Other Ambulatory Visit: Payer: Self-pay

## 2010-12-13 DIAGNOSIS — I200 Unstable angina: Secondary | ICD-10-CM

## 2010-12-13 LAB — CBC
MCH: 33.1 pg (ref 26.0–34.0)
MCHC: 34.6 g/dL (ref 30.0–36.0)
MCV: 95.6 fL (ref 78.0–100.0)
Platelets: 151 10*3/uL (ref 150–400)
RDW: 12.6 % (ref 11.5–15.5)

## 2010-12-13 LAB — PROTIME-INR
INR: 1.57 — ABNORMAL HIGH (ref 0.00–1.49)
Prothrombin Time: 27.5 seconds — ABNORMAL HIGH (ref 11.6–15.2)
Prothrombin Time: 19.1 seconds — ABNORMAL HIGH (ref 11.6–15.2)

## 2010-12-13 LAB — HEMOGLOBIN A1C: Mean Plasma Glucose: 97 mg/dL (ref <117)

## 2010-12-13 LAB — LIPID PANEL
HDL: 39 mg/dL — ABNORMAL LOW (ref >39)
LDL Cholesterol: 135 mg/dL — ABNORMAL HIGH (ref 0–99)
Total CHOL/HDL Ratio: 5.2 RATIO
Triglycerides: 141 mg/dL (ref <150)
VLDL: 28 mg/dL (ref 0–40)

## 2010-12-13 LAB — COMPREHENSIVE METABOLIC PANEL
Albumin: 3.6 g/dL (ref 3.5–5.2)
Alkaline Phosphatase: 112 U/L (ref 39–117)
BUN: 30 mg/dL — ABNORMAL HIGH (ref 6–23)
Creatinine, Ser: 1.05 mg/dL (ref 0.50–1.35)
GFR calc Af Amer: 78 mL/min — ABNORMAL LOW (ref >90)
Glucose, Bld: 105 mg/dL — ABNORMAL HIGH (ref 70–99)
Total Bilirubin: 0.3 mg/dL (ref 0.3–1.2)
Total Protein: 6.6 g/dL (ref 6.0–8.3)

## 2010-12-13 LAB — MAGNESIUM: Magnesium: 2.1 mg/dL (ref 1.5–2.5)

## 2010-12-13 LAB — CARDIAC PANEL(CRET KIN+CKTOT+MB+TROPI)
Relative Index: INVALID (ref 0.0–2.5)
Relative Index: INVALID (ref 0.0–2.5)
Total CK: 93 U/L (ref 7–232)
Total CK: 73 U/L (ref 7–232)
Troponin I: <0.3 ng/mL (ref <0.30)
Troponin I: <0.3 ng/mL (ref <0.30)

## 2010-12-13 LAB — BASIC METABOLIC PANEL
Calcium: 8 mg/dL — ABNORMAL LOW (ref 8.4–10.5)
GFR calc Af Amer: >90 mL/min (ref >90)
GFR calc non Af Amer: 80 mL/min — ABNORMAL LOW (ref >90)
Glucose, Bld: 102 mg/dL — ABNORMAL HIGH (ref 70–99)
Potassium: 3.8 mEq/L (ref 3.5–5.1)
Sodium: 140 mEq/L (ref 135–145)

## 2010-12-13 LAB — GLUCOSE, CAPILLARY: Glucose-Capillary: 115 mg/dL — ABNORMAL HIGH (ref 70–99)

## 2010-12-13 LAB — MRSA PCR SCREENING: MRSA by PCR: NEGATIVE

## 2010-12-13 MED ORDER — ATROPINE SULFATE 1 MG/ML IJ SOLN
INTRAMUSCULAR | Status: AC
  Filled 2010-12-13: qty 1

## 2010-12-13 MED ORDER — BIOTENE DRY MOUTH MT LIQD
15.0000 mL | Freq: Two times a day (BID) | OROMUCOSAL | Status: DC
  Administered 2010-12-13 – 2010-12-14 (×2): 15 mL via OROMUCOSAL

## 2010-12-13 MED ORDER — SODIUM CHLORIDE 0.9 % IV SOLN
INTRAVENOUS | Status: DC
  Administered 2010-12-13: 10 mL via INTRAVENOUS

## 2010-12-13 MED ORDER — FUROSEMIDE 10 MG/ML IJ SOLN
40.0000 mg | Freq: Once | INTRAMUSCULAR | Status: AC
Start: 2010-12-13 — End: 2010-12-13
  Administered 2010-12-13: 40 mg via INTRAVENOUS

## 2010-12-13 NOTE — Progress Notes (Signed)
Late entry: Pt arrived on 2900 on NRB with O2 sats around 100%. Pt was in no obvious respiratory distress, but did complain of chest pain. After assessing pt, RT did not feel pt needed BIPAP at this time. Pt was weaned to a 50% Venturi mask, and maintains sats of around 99%. RT will continue to monitor.

## 2010-12-13 NOTE — Progress Notes (Signed)
Chaplain's Note:  Met pt and wife in hallway on way to cath procedure.  Provided emotional support.  Will continue to follow as needed or requested.  [employer]  [phone]

## 2010-12-13 NOTE — Progress Notes (Signed)
[place] [facility]   Subjective:    There were no events overnight. The pt did have persistent chest pain for 3 hours after cath, resolved with morphine. Heparin gtt has not been started as his INR is still 2.5 and sheath has not been removed yet. Currently, the patient denies symptoms of chest pressure/discomfort, dyspnea, orthopnea and paroxysmal nocturnal dyspnea, chills, fevers.    Objective:  Vital Signs:   BP 95/47  Pulse 69  Temp(Src) 97.5 F (36.4 C) (Oral)  Resp 12  Ht 5\' 9"  (1.753 m)  Wt 140 lb 6.9 oz (63.7 kg)  BMI 20.74 kg/m2  SpO2 95%    Intake/Output:  11/17 0701 - 11/18 0700 In: 798.6 [I.V.:798.6] Out: 2950 [Urine:2950]   Physical Exam: General: Vital signs reviewed and noted. Well-developed, well-nourished, in no acute distress; alert, appropriate and cooperative throughout examination.  Lungs:  Normal respiratory effort. Clear to auscultation BL without crackles. Mild wheezes.  Heart: RRR. S1 and S2 normal without gallop, murmur, or rubs.  Abdomen:  BS normoactive. Soft, Nondistended, non-tender.  No masses or organomegaly.  Extremities: No pretibial edema.     Labs: Basic Metabolic Panel: Lab 12/13/10 0522 12/12/10 2330 12/12/10 2003  NA 140 134* 138  K 3.8 4.2 4.3  CL 101 96 104  CO2 28 26 --  GLUCOSE 102* 105* 285*  BUN 29* 30* 34*  CREATININE 0.94 1.05 1.50*  CALCIUM 8.0* 7.9* --  MG -- 2.1 --  PHOS -- -- --    Liver Function Tests: Lab 12/12/10 2330  AST 100*  ALT 77*  ALKPHOS 112  BILITOT 0.3  PROT 6.6  ALBUMIN 3.6    CBC: Lab 12/13/10 0522 12/12/10 2003 12/12/10 2002  WBC 11.4* -- 12.6*  NEUTROABS -- -- 6.2  HGB 12.9* 15.6 14.9  HCT 37.3* 46.0 43.5  MCV 95.6 -- 97.5  PLT 151 -- 178    Cardiac Enzymes:  Lab 12/13/10 0522 12/13/10 0015 12/12/10 2303  CKTOTAL 73 90 93  CKMB 4.5* 4.0 4.0  CKMBINDEX -- -- --  TROPONINI <0.30 <0.30 <0.30    BNP: Lab 12/12/10 2002  POCBNP 3641.0*    CBG: Lab 12/12/10 2353 12/12/10  2001  GLUCAP 115* 267*    Microbiology: MRSA PCR SCREENING     Status: Negative    Coagulation Studies: Basename 12/13/10 0521 12/12/10 2002  LABPROT 27.5* 23.3*  INR 2.51* 2.03*     Other results:  EKG (12/12/2010) - NSR, paced rhythm  Imaging:  Dg Chest Portable 1 View (12/12/2010) - Mild to moderate congestive heart failure, without significant change.  Last 2D Echo (10/2004) - LV function reduced with LVEF 20-25%, aortic valve was mildly to moderately calcified. LA was mildly dilated.  Cardiac Cath (12/12/2010) -   High-grade distal arteriovenous groove circumflex stenosis in a dominant left circumflex system.   Significant native coronary artery disease with 60% proximal LAD stenosis for prominent septal perforating artery.   Dominant left circumflex coronary artery with 50% proximal stenosis, 70-80% stenosis in the proximal portion of the marginal 1 vessel, 90% eccentric stenosis in the region of a small and more distal marginal vessel, which may be occluded.   Small nondominant right coronary artery.   Patent LIMA to LAD.   Patent vein graft to the obtuse marginal 1 vessel with mild 20% narrowing in the proximal third of the graft.   Occluded vein graft, with uncertain destination but possibly supplying a distal circumflex marginal vessel.   PCI of distal atrioventricular groove  circumflex with ultimate PTCA and stenting and insertion of a BMS  RAO ventriculography - markedly dilated left ventricle with severe global hypokinesis and an ejection fraction of 10-15%.     Medications:   Infusions: . sodium chloride 1 mL/kg/hr (12/13/10 0128)  . bivalirudin (ANGIOMAX) infusion 5 mg/mL (Cath Lab,ACS,PCI indication) 0.25 mg/kg/hr (12/13/10 0010)  . nitroGLYCERIN 5 mcg/min (12/13/10 0600)    Scheduled Medications: . aspirin  81 mg Oral Daily  . bivalirudin      . carvedilol  12.5 mg Oral BID WC  . clopidogrel  75 mg Oral Q breakfast  . digoxin  250 mcg Oral  Daily  . enalapril  2.5 mg Oral Daily  . fentaNYL      . ferrous fumarate-b12-vitamic C-folic acid  1 capsule Oral Daily  . furosemide  40 mg Intravenous BID  . furosemide  80 mg Intravenous Once  . heparin      . lidocaine      . midazolam      . morphine      . morphine      . nitroGLYCERIN      . nitroGLYCERIN      . PHENobarbital  48.6 mg Oral Daily  . PHENobarbital  97.2 mg Oral QHS  . phenytoin  100 mg Oral Daily  . phenytoin  200 mg Oral QHS  . potassium chloride  10 mEq Oral BID    PRN Medications: acetaminophen, acetaminophen, morphine injection, nitroGLYCERIN, ondansetron (ZOFRAN) IV, ondansetron (ZOFRAN) IV    Assessment/ Plan:   Pt is a 77 y.o. 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 Cardiac enzymes have been negative x 3. Pt had emergent cath overnight secondary to ongoing nature of pain . Cath showed high-grade distal arteriovenous groove circumflex stenosis, thought to be the culprit lesion, and required PTCA with BMS to this lesion. Also found to have severe native CAD and markedly dilated left ventricle with severe global hypokinesis and an ejection fraction of 10-15%.     ACS in the setting of known CAD - cath showing culprit lesion to likely be a high-grade distal arteriovenous groove circumflex stenosis. He is now s/p PTCA with BMS to this lesion. Notably, pt still has his cath sheath in, was not pulled secondary to INR therapeutic.   Continue BB, ASA, statin, Plavix  Encourage smoking cessation    Consider FFP to reverse coumadin to allow for sheath to be pulled safely.   Ischemic cardiomyopathy (ejection fraction 10-15% per cath)  Continue ACE-I, consider increase dosage if tolerated by BP soon.  Continue BB.   Consider resuming home Lasix of 40mg  BID when blood pressure  tolerates.   Hypotension - currently asymptomatic, but with softer blood perssures 90-100s systolic. No evidence of volume overload at this time.  Hold AM IV Lasix, consider resuming reduced dose as needed.   Atrial fibrillation, on chronic coumadin - has been held peri-procedurally for cath.      Non-oxygen dependent COPD - with ongoing tobacco abuse - continue emphasize smoking cessation.   This patient's case and plan of care was discussed with attending, Kristeen Miss, M.D.    Length of Stay: 1   Johnette Abraham, Norman Herrlich, Internal Medicine Resident 12/13/2010, 7:17 AM  Attending Note:   Patient seen and examined with resident physician, Dr.  Kalia-Reynolds.  I agree with the details above.  We will need to give FFP in order to remove the sheath today.  INR is 2.5.  Will give 2 units of FFP with Lasix between doses.    Will watch for worsening CHF.    BP are slightly low which is normal for him. No chest pain at present.   [name], Montez Hageman., MD, [facility] 12/13/2010, 8:57 AM

## 2010-12-14 ENCOUNTER — Inpatient Hospital Stay (HOSPITAL_COMMUNITY): Payer: Medicare Other

## 2010-12-14 DIAGNOSIS — I959 Hypotension, unspecified: Secondary | ICD-10-CM | POA: Diagnosis present

## 2010-12-14 DIAGNOSIS — I2510 Atherosclerotic heart disease of native coronary artery without angina pectoris: Secondary | ICD-10-CM

## 2010-12-14 DIAGNOSIS — I4891 Unspecified atrial fibrillation: Secondary | ICD-10-CM | POA: Diagnosis present

## 2010-12-14 LAB — PREPARE FRESH FROZEN PLASMA: Unit division: 0

## 2010-12-14 LAB — BASIC METABOLIC PANEL
BUN: 24 mg/dL — ABNORMAL HIGH (ref 6–23)
CO2: 30 mEq/L (ref 19–32)
Calcium: 8.4 mg/dL (ref 8.4–10.5)
Chloride: 99 mEq/L (ref 96–112)
Creatinine, Ser: 0.92 mg/dL (ref 0.50–1.35)
Glucose, Bld: 90 mg/dL (ref 70–99)

## 2010-12-14 LAB — GLUCOSE, CAPILLARY: Glucose-Capillary: 106 mg/dL — ABNORMAL HIGH (ref 70–99)

## 2010-12-14 LAB — PROTIME-INR: INR: 1.31 (ref 0.00–1.49)

## 2010-12-14 MED ORDER — PHENOBARBITAL 100 MG PO TABS
100.0000 mg | ORAL_TABLET | Freq: Every day | ORAL | Status: DC
  Administered 2010-12-14 – 2010-12-15 (×2): 100 mg via ORAL
  Filled 2010-12-14 (×3): qty 1

## 2010-12-14 MED ORDER — HEPARIN (PORCINE) IN NACL 100-0.45 UNIT/ML-% IJ SOLN
1250.0000 [IU]/h | INTRAMUSCULAR | Status: DC
  Administered 2010-12-14: 1250 [IU]/h via INTRAVENOUS
  Administered 2010-12-14: 1000 [IU]/h via INTRAVENOUS
  Filled 2010-12-14 (×2): qty 250

## 2010-12-14 MED ORDER — ROSUVASTATIN CALCIUM 20 MG PO TABS
20.0000 mg | ORAL_TABLET | Freq: Every day | ORAL | Status: DC
  Administered 2010-12-14 – 2010-12-15 (×2): 20 mg via ORAL
  Filled 2010-12-14 (×4): qty 1

## 2010-12-14 MED ORDER — WARFARIN SODIUM 7.5 MG PO TABS
7.5000 mg | ORAL_TABLET | Freq: Once | ORAL | Status: AC
  Administered 2010-12-14: 7.5 mg via ORAL
  Filled 2010-12-14: qty 1

## 2010-12-14 MED ORDER — PHENOBARBITAL 100 MG PO TABS
50.0000 mg | ORAL_TABLET | Freq: Every day | ORAL | Status: DC
  Administered 2010-12-14 – 2010-12-16 (×3): 50 mg via ORAL
  Filled 2010-12-14 (×2): qty 1

## 2010-12-14 MED ORDER — FUROSEMIDE 40 MG PO TABS
40.0000 mg | ORAL_TABLET | Freq: Two times a day (BID) | ORAL | Status: DC
  Administered 2010-12-14 – 2010-12-16 (×5): 40 mg via ORAL
  Filled 2010-12-14 (×8): qty 1

## 2010-12-14 MED ORDER — CLOPIDOGREL BISULFATE 75 MG PO TABS
75.0000 mg | ORAL_TABLET | Freq: Every day | ORAL | Status: DC
  Administered 2010-12-15 – 2010-12-16 (×2): 75 mg via ORAL
  Filled 2010-12-14: qty 1

## 2010-12-14 MED ORDER — LISINOPRIL 2.5 MG PO TABS
2.5000 mg | ORAL_TABLET | Freq: Every day | ORAL | Status: DC
  Administered 2010-12-14 – 2010-12-15 (×2): 2.5 mg via ORAL
  Filled 2010-12-14 (×3): qty 1

## 2010-12-14 MED FILL — Dextrose Inj 5%: INTRAVENOUS | Qty: 50 | Status: AC

## 2010-12-14 NOTE — Progress Notes (Signed)
CARDIAC REHAB PHASE I   PRE:  Rate/Rhythm: 89 pacing on demand    BP: sitting 116/50    SaO2: 93 RA  MODE:  Ambulation: 300 ft   POST:  Rate/Rhythm: 107 pacing    BP: sitting 110/60     SaO2: 93 RA  Upon standing pt had LOB which he corrected by holding to bed.  Sts he looked sideways and "things went black".  Denied any further episode walking 300 ft with assist x1 however pt wobbly, seems tired. Looked to be pacing on demand, HR up to low 100s walking. Wife sts pt is normally wobbly, sometimes uses a cane or scooter but remains very active (carries wood, cans vegetables, skins deer).  Wife does st that he falls 2-3 times a year. Pt continued to look fatigued in recliner after walk.  Sts he is always tired when he sits but feels good when working PTA. Began ed with pt and wife.  Elissa Lovett [place] CES, ACSM

## 2010-12-14 NOTE — Progress Notes (Signed)
ANTICOAGULATION CONSULT NOTE - Follow Up Consult  Pharmacy Consult for  Heparin/  Also on Coumadin  Indication: chronic atrial fibrillation  Allergies  Allergen Reactions  . Valsartan     Patient Measurements: Height: 5\' 9"  (175.3 cm) Weight: 143 lb 11.8 oz (65.2 kg) IBW/kg (Calculated) : 70.7    Labs:  Basename 12/14/10 1733 12/14/10 0902 12/14/10 0500 12/13/10 1215 12/13/10 0522 12/13/10 0521 12/13/10 0015 12/12/10 2330 12/12/10 2303 12/12/10 2003 12/12/10 2002  HGB -- -- -- -- 12.9* -- -- -- -- 15.6 --  HCT -- -- -- -- 37.3* -- -- -- -- 46.0 43.5  PLT -- -- -- -- 151 -- -- -- -- -- 178  APTT -- -- -- 38* -- -- -- -- -- -- 32  LABPROT -- 16.5* -- 19.1* -- 27.5* -- -- -- -- --  INR -- 1.31 -- 1.57* -- 2.51* -- -- -- -- --  HEPARINUNFRC 0.14* -- -- -- -- -- -- -- -- -- --  CREATININE -- -- 0.92 -- 0.94 -- -- 1.05 -- -- --  CKTOTAL -- -- -- -- 73 -- 90 -- 93 -- --  CKMB -- -- -- -- 4.5* -- 4.0 -- 4.0 -- --  TROPONINI -- -- -- -- <0.30 -- <0.30 -- <0.30 -- --   Estimated Creatinine Clearance: 64 ml/min (by C-G formula based on Cr of 0.92).   Medications:     On Coumadin prior to admission, resumed tonight. 2 units FFP given 11/18 Plavix added 11/18. Aspirin 81 mg given 11/18 & this am, now dc'd.  Assessment:     Heparin drip running at 1000 units/hr since ~10am today.     Heparin level only 0.14 tonight.  Subtherapeutic.  Goal of Therapy:  Heparin level 0.3-0.7 units/ml   Plan:    Will increase Heparin drip to 1250 units/hr.   Next heparin level in ~ 6 hrs.   PT/INR in AM as planned.     Dennie Fetters 12/14/2010,8:15 PM

## 2010-12-14 NOTE — Progress Notes (Addendum)
[place] [facility]   Subjective:   There were no events overnight. Currently, the patient confirms symptoms of back pain. He denies symptoms of chest pressure/discomfort, dyspnea, orthopnea, palpitations and paroxysmal nocturnal dyspnea, chills, fevers.    Objective:  Vital Signs:   BP 108/45  Pulse 67  Temp(Src) 98.5 F (36.9 C) (Oral)  Resp 17  Ht 5\' 9"  (1.753 m)  Wt 140 lb 6.9 oz (63.7 kg)  BMI 20.74 kg/m2  SpO2 94%    Intake/Output:  01/02/23 0701 - 11/19 0700 In: 2178.2 [P.O.:560; I.V.:305.2; Blood:609; IV Piggyback:4] Out: 1345 [Urine:1345]   Physical Exam: General: Vital signs reviewed and noted. Well-developed, well-nourished, in no acute distress; alert, appropriate and cooperative throughout examination.  Lungs:  Normal respiratory effort. Clear to auscultation BL without crackles. Mild wheezes.  Heart: RRR. S1 and S2 normal without gallop, murmur, or rubs.  Abdomen:  BS normoactive. Soft, Nondistended, non-tender.  No masses or organomegaly.  Extremities: No pretibial edema.     Labs: Basic Metabolic Panel:  Lab 01/02/11 1610 12/12/10 2330 12/12/10 2003  NA 140 134* 138  K 3.8 4.2 4.3  CL 101 96 104  CO2 28 26 --  GLUCOSE 102* 105* 285*  BUN 29* 30* 34*  CREATININE 0.94 1.05 1.50*  CALCIUM 8.0* 7.9* --  MG -- 2.1 --  PHOS -- -- --    Liver Function Tests:  Lab 12/12/10 2330  AST 100*  ALT 77*  ALKPHOS 112  BILITOT 0.3  PROT 6.6  ALBUMIN 3.6    CBC:  Lab 2011-01-02 0522 12/12/10 2003 12/12/10 2002  WBC 11.4* -- 12.6*  NEUTROABS -- -- 6.2  HGB 12.9* 15.6 14.9  HCT 37.3* 46.0 43.5  MCV 95.6 -- 97.5  PLT 151 -- 178    Cardiac Enzymes:  Lab January 02, 2011 0522 02-Jan-2011 0015 12/12/10 2303  CKTOTAL 73 90 93  CKMB 4.5* 4.0 4.0  CKMBINDEX -- -- --  TROPONINI <0.30 <0.30 <0.30    BNP:  Lab 12/12/10 2002  POCBNP 3641.0*    CBG:  Lab 12/12/10 2353 12/12/10 2001  GLUCAP 115* 267*    Microbiology: Results for orders placed during the  hospital encounter of 12/12/10  MRSA PCR SCREENING     Status: Normal   Collection Time   02-Jan-2011  1:53 AM      Component Value Range Status Comment   MRSA by PCR NEGATIVE  NEGATIVE  Final     Coagulation Studies:  Basename 01/02/2011 1215 January 02, 2011 0521 12/12/10 2002  LABPROT 19.1* 27.5* 23.3*  INR 1.57* 2.51* 2.03*    Other results:  EKG (12/12/2010) - NSR, paced rhythm  Imaging:  Dg Chest Portable 1 View (12/12/2010) - Mild to moderate congestive heart failure, without significant change.  Last 2D Echo (10/2004) - LV function reduced with LVEF 20-25%, aortic valve was mildly to moderately calcified. LA was mildly dilated.  Cardiac Cath (12/12/2010) -   High-grade distal arteriovenous groove circumflex stenosis in a dominant left circumflex system.   Significant native coronary artery disease with 60% proximal LAD stenosis for prominent septal perforating artery.   Dominant left circumflex coronary artery with 50% proximal stenosis, 70-80% stenosis in the proximal portion of the marginal 1 vessel, 90% eccentric stenosis in the region of a small and more distal marginal vessel, which may be occluded.   Small nondominant right coronary artery.   Patent LIMA to LAD.   Patent vein graft to the obtuse marginal 1 vessel with mild 20% narrowing in the proximal  third of the graft.   Occluded vein graft, with uncertain destination but possibly supplying a distal circumflex marginal vessel.   PCI of distal atrioventricular groove circumflex with ultimate PTCA and stenting and insertion of a BMS  RAO ventriculography - markedly dilated left ventricle with severe global hypokinesis and an ejection fraction of 10-15%.    Medications:  Infusions:    . sodium chloride 1 mL/kg/hr (12/13/10 0128)  . sodium chloride 10 mL (12/13/10 1123)  . nitroGLYCERIN Stopped (12/13/10 1400)    Scheduled Medications:    . antiseptic oral rinse  15 mL Mouth Rinse BID  . aspirin  81 mg Oral  Daily  . carvedilol  12.5 mg Oral BID WC  . clopidogrel  75 mg Oral Q breakfast  . digoxin  250 mcg Oral Daily  . enalapril  2.5 mg Oral Daily  . ferrous fumarate-b12-vitamic C-folic acid  1 capsule Oral Daily  . furosemide  40 mg Intravenous BID  . furosemide  40 mg Intravenous Once  . PHENobarbital  48.6 mg Oral Daily  . PHENobarbital  97.2 mg Oral QHS  . phenytoin  100 mg Oral Daily  . phenytoin  200 mg Oral QHS  . potassium chloride  10 mEq Oral BID    PRN Medications: acetaminophen, acetaminophen, morphine injection, nitroGLYCERIN, ondansetron (ZOFRAN) IV, ondansetron (ZOFRAN) IV   Assessment/ Plan:   Pt is a 77 y.o. 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 Cardiac enzymes have been negative x 3. Pt had emergent cath overnight secondary to ongoing nature of pain . Cath showed high-grade distal arteriovenous groove circumflex stenosis, thought to be the culprit lesion, and required PTCA with BMS to this lesion. Also found to have severe native CAD and markedly dilated left ventricle with severe global hypokinesis and an ejection fraction of 10-15%.     ACS in the setting of known CAD - cath showing culprit lesion to likely be a high-grade distal arteriovenous groove circumflex stenosis. He is now s/p PTCA with BMS to this lesion. Was given FFP 2 units 11/18 to reverse coumadin effects because sheath could be pulled.  Continue BB, ASA, statin, Plavix  Encourage smoking cessation     Ischemic cardiomyopathy (ejection fraction 10-15% per cath)  Continue ACE-I, consider increase dosage if tolerated by BP soon.  Continue BB.   Resume home Lasix of 40mg  BID when blood pressure tolerates.  2-view CXR in AM.   Hypotension - currently asymptomatic, BPs stable overnight. No evidence of volume overload at this  time.  Consider change to home Lasix 40 mg BID, po.  Spoke with Dr. Elease Hashimoto who recommended change from Enalapril to Lisinopril.   Atrial fibrillation, on chronic coumadin - has been held peri-procedurally for cath.   Consider resuming coumadin today.     Non-oxygen dependent COPD - with ongoing tobacco abuse - continue emphasize smoking cessation.   This patient's case and plan of care was discussed with attending, Dietrich Pates, M.D.   Length of Stay: 2   [name], Norman Herrlich, Internal Medicine Resident 12/14/2010, 6:44 AM  I have seen and examined the patient.  Agree with findings of Dr. Saralyn Pilar. On exam, patient without CP.  Lungs clear. Cardiac shows RRR   No edema. Plan:  1.  CAD.  S/p BMS. Tx to floor today.  AMbulate.  2.  BP:  FOllow BP.  3.  Afib.  Follow HR  Keep on coumadin. Patient eager to go home.  Principal Problem:  *Unstable angina  Patient r/o for MI.  BMS.  Doing well.  Tx to floor  Atrial fibrillation:  Rates controlled.  Keep on coumadin.  DYSLIPIDEMIA   CARDIOMYOPATHY, ISCHEMIC  Volume status looks good.  Congestive heart failure, unspecified  CORONARY ARTERY BYPASS GRAFT, HX OF

## 2010-12-14 NOTE — Progress Notes (Signed)
Pt in with urgent cath. Pt s from home with wife.  CM spoke to pt's wife d/t pt sleeping. CM discussed with wife the need for [facility] RN. She stated he will not need one. CM will come back when pt awake and speak to him. CM will continue to monitor for d/c disposition. Gala Lewandowsky, [place] 12-14-10 [phone]

## 2010-12-14 NOTE — Consult Note (Signed)
Pt and wife both smoke 1 ppd. Pt says I'm done and plans to quit cold [place]. Wife does not respond to much. Referred to 1-800 quit now for f/u and support. Discussed oral fixation substitutes, second hand smoke and in home smoking policy. Reviewed and gave pt Written education/contact information.

## 2010-12-14 NOTE — Progress Notes (Addendum)
ANTICOAGULATION CONSULT NOTE - Initial Consult  Pharmacy Consult for Warfarin Indication:  Chronic Atrial fibrillation  Allergies  Allergen Reactions  . Valsartan     Patient Measurements: Height: 5\' 9"  (175.3 cm) Weight: 143 lb 11.8 oz (65.2 kg) IBW/kg (Calculated) : 70.7  Adjusted Body Weight: NA  Vital Signs: Temp: 98.1 F (36.7 C) (11/19 0751) Temp src: Oral (11/19 0751) BP: 99/49 mmHg (11/19 0700) Pulse Rate: 70  (11/19 0700)  Labs:  Basename 12/14/10 0500 12/13/10 1215 12/13/10 0522 12/13/10 0521 12/13/10 0015 12/12/10 2330 12/12/10 2303 12/12/10 2003 12/12/10 2002  HGB -- -- 12.9* -- -- -- -- 15.6 --  HCT -- -- 37.3* -- -- -- -- 46.0 43.5  PLT -- -- 151 -- -- -- -- -- 178  APTT -- 38* -- -- -- -- -- -- 32  LABPROT -- 19.1* -- 27.5* -- -- -- -- 23.3*  INR -- 1.57* -- 2.51* -- -- -- -- 2.03*  HEPARINUNFRC -- -- -- -- -- -- -- -- --  CREATININE 0.92 -- 0.94 -- -- 1.05 -- -- --  CKTOTAL -- -- 73 -- 90 -- 93 -- --  CKMB -- -- 4.5* -- 4.0 -- 4.0 -- --  TROPONINI -- -- <0.30 -- <0.30 -- <0.30 -- --   Estimated Creatinine Clearance: 64 ml/min (by C-G formula based on Cr of 0.92).  Medical History: Past Medical History  Diagnosis Date  . Tobacco use disorder   . Esophageal reflux   . Unspecified cerebral artery occlusion with cerebral infarction   . Other and unspecified hyperlipidemia   . Other convulsions   . Postsurgical aortocoronary bypass status   . Other malaise and fatigue   . Cardiac pacemaker in situ   . Other specified forms of chronic ischemic heart disease   . Coronary artery disease     MI x2 in 1989 with subsequent CABG  . Congestive heart failure, unspecified     Ischemic cardiomyopathy EF 20-25%  . Myocardial infarction 1989  . Sinoatrial node dysfunction   . Atrial fibrillation   . Stroke   . Angina   . COPD (chronic obstructive pulmonary disease)   . Anemia     Medications:  Prescriptions prior to admission  Medication Sig Dispense  Refill  . aspirin 325 MG tablet Take 325 mg by mouth daily.        . carvedilol (COREG) 12.5 MG tablet Take 12.5 mg by mouth 2 (two) times daily with a meal.        . digoxin (LANOXIN) 0.25 MG tablet Take 250 mcg by mouth daily.        . enalapril (VASOTEC) 2.5 MG tablet Take 2.5 mg by mouth daily.        Marland Kitchen FeFum-FePoly-FA-B Cmp-C-Biot (FOLIVANE-PLUS) CAPS Take 1 capsule by mouth daily.        . furosemide (LASIX) 40 MG tablet Take 40 mg by mouth 2 (two) times daily.        Marland Kitchen omeprazole (PRILOSEC OTC) 20 MG tablet Take 20 mg by mouth daily.        Marland Kitchen PHENobarbital (LUMINAL) 97.2 MG tablet Take 97.2 mg by mouth as directed. 1/2 tab in AM 1 tab in PM       . phenytoin (DILANTIN) 100 MG ER capsule Take by mouth as directed. 1 tab in morning 1 tab at noon 2 tabs at night       . potassium chloride (KLOR-CON) 10 MEQ CR tablet Take 10 mEq  by mouth daily.        . simvastatin (ZOCOR) 80 MG tablet Take 80 mg by mouth at bedtime.        Marland Kitchen warfarin (COUMADIN) 5 MG tablet Take 5 mg by mouth as directed. Take 1 tablet every day except Tuesdays, and Saturdays. On Tuesdays and Saturdays take 1.5 tablets.        Assessment: 77yo male admitted with chest pain requiring cardiac cath and PCI.  He now has a BMS placed and is on Plavix.  He has chronic Afib and has been on Warfarin.  Plans is to resume this today.  He is without noted bleeding complications but received 2 units FFP to reverse Warfarin so sheath could be removed.  Goal of Therapy:  INR 2-3   Plan:  1.  Will begin Warfarin with dose of 7.5 mg today.  He was taking Warfarin 5 mg daily except on Tuesday/Saturday and       he was taking 7.5mg  on those days. 2.  Daily PT/INR for now 3.  Monitor closely for s/s of bleeding complications.  Nadara Mustard Datto 12/14/2010,8:35 AM   Addendum: Will start IV Heparin also at 1000 units/hr and check an 8 hour level. Monitor daily heparin levels and discontinue when INR >2.0

## 2010-12-15 ENCOUNTER — Encounter (HOSPITAL_COMMUNITY): Payer: Self-pay

## 2010-12-15 LAB — BASIC METABOLIC PANEL
BUN: 21 mg/dL (ref 6–23)
Calcium: 8.2 mg/dL — ABNORMAL LOW (ref 8.4–10.5)
GFR calc non Af Amer: 85 mL/min — ABNORMAL LOW (ref >90)
Glucose, Bld: 94 mg/dL (ref 70–99)

## 2010-12-15 LAB — PROTIME-INR: INR: 1.25 (ref 0.00–1.49)

## 2010-12-15 MED ORDER — WARFARIN SODIUM 7.5 MG PO TABS
7.5000 mg | ORAL_TABLET | Freq: Once | ORAL | Status: AC
  Administered 2010-12-15: 7.5 mg via ORAL
  Filled 2010-12-15: qty 1

## 2010-12-15 MED ORDER — ENOXAPARIN (LOVENOX) PATIENT EDUCATION KIT
PACK | Freq: Once | Status: AC
  Administered 2010-12-16: 09:00:00
  Filled 2010-12-15: qty 1

## 2010-12-15 MED ORDER — ENOXAPARIN SODIUM 60 MG/0.6ML ~~LOC~~ SOLN
60.0000 mg | Freq: Two times a day (BID) | SUBCUTANEOUS | Status: DC
  Administered 2010-12-15 – 2010-12-16 (×2): 60 mg via SUBCUTANEOUS
  Filled 2010-12-15 (×4): qty 0.6

## 2010-12-15 NOTE — Clinical Documentation Improvement (Signed)
CHF DOCUMENTATION CLARIFICATION QUERY  Please update your documentation within the medical record to reflect your response to this query.                                                                                     12/15/10  Dear Dr.Skylor Schnapp / Associates,  In a better effort to capture your patient's severity of illness, reflect appropriate length of stay and utilization of resources, a review of the patient medical record has revealed the following indicators the diagnosis of Heart Failure.    Based on your clinical judgment, please clarify and document in a progress note and/or discharge summary the clinical condition associated with the following supporting information:  In responding to this query please exercise your independent judgment.  The fact that a query is asked, does not imply that any particular answer is desired or expected.  Please clarify "Acute on Chronic CHF"...  Acute on Chronic Systolic Congestive Heart Failure Acute on Chronic Diastolic Congestive Heart Failure Acute on Chronic Systolic & Diastolic  Congestive Heart Failure Other Condition________________________________________ Cannot Clinically Determine  Supporting Information:  Risk Factors: COPD ongoing tobacco abuse, known CAD s/p MI x2, CABG, ICM, Last IP Admit 10/27 states: "chronic systolic congestive heart failure"  Signs & Symptoms: ACS requiring emergent cath/PCI, Bipap, "Crackles bilaterally at bases"  Diagnostics: BNP: 2466       Echo results: EF: 20-25% from prev ECHO to 10-15%  EKG: NSR/Paced  Radiology: Mild to moderate congestive heart failure, without significant change.    Treatment: Foley cath, strict I&O, daily weights Diuretics: 11/17 Lasix IV 80mg , 11/17 Lasix 60mg  IV, Lasix 40mg  bid  Reviewed: additional documentation in the medical record  Thank You,  Sincerely, Beverley Fiedler RN  Clinical Documentation Specialist: Pager  [phone]  Health Information  Management [place]

## 2010-12-15 NOTE — Progress Notes (Signed)
[place] [facility]   Subjective:    Pt is doing well.  Ambulating without any problems. No angina.  Objective:  Vital Signs:   BP 103/63  Pulse 73  Temp(Src) 97.8 F (36.6 C) (Oral)  Resp 18  Ht 5\' 9"  (1.753 m)  Wt 143 lb 11.8 oz (65.2 kg)  BMI 21.23 kg/m2  SpO2 97%      . antiseptic oral rinse  15 mL Mouth Rinse BID  . carvedilol  12.5 mg Oral BID WC  . clopidogrel  75 mg Oral Q breakfast  . digoxin  250 mcg Oral Daily  . ferrous fumarate-b12-vitamic C-folic acid  1 capsule Oral Daily  . furosemide  40 mg Oral BID  . lisinopril  2.5 mg Oral Daily  . PHENObarbital  100 mg Oral QHS  . PHENObarbital  50 mg Oral Daily  . phenytoin  100 mg Oral Daily  . phenytoin  200 mg Oral QHS  . potassium chloride  10 mEq Oral BID  . rosuvastatin  20 mg Oral q1800  . warfarin  7.5 mg Oral ONCE-1800  . warfarin  7.5 mg Oral ONCE-1800  . DISCONTD: aspirin  81 mg Oral Daily  . DISCONTD: clopidogrel  75 mg Oral Q breakfast    Intake/Output:  11/19 0701 - 11/20 0700 In: 1285 [P.O.:840; I.V.:445] Out: 1800 [Urine:1800]   Physical Exam: General: Vital signs reviewed and noted. Well-developed, well-nourished, in no acute distress; alert, appropriate and cooperative throughout examination.  Lungs:  Normal respiratory effort. Clear to auscultation BL without crackles. Mild wheezes.  Heart: RRR. S1 and S2 normal without gallop, murmur, or rubs.  Abdomen:  BS normoactive. Soft, Nondistended, non-tender.  No masses or organomegaly.  Extremities: No pretibial edema.     Lab Results  Component Value Date   WBC 11.4* 12/13/2010   HGB 12.9* 12/13/2010   HCT 37.3* 12/13/2010   MCV 95.6 12/13/2010   PLT 151 12/13/2010     Cardiac Enzymes:  Lab 12/13/10 0522 12/13/10 0015 12/12/10 2303  CKTOTAL 73 90 93  CKMB 4.5* 4.0 4.0  CKMBINDEX -- -- --  TROPONINI <0.30 <0.30 <0.30    BNP: Lab 12/12/10 2002  POCBNP 3641.0*    CBG: Lab 12/12/10 2353 12/12/10 2001  GLUCAP 115* 267*     Microbiology: MRSA PCR SCREENING     Status: Negative    Coagulation Studies: Basename 12/13/10 0521 12/12/10 2002  LABPROT 27.5* 23.3*  INR 2.51* 2.03*     Other results:  EKG (12/12/2010) - NSR, paced rhythm  Imaging:  Dg Chest Portable 1 View (12/12/2010) - Mild to moderate congestive heart failure, without significant change.  Last 2D Echo (10/2004) - LV function reduced with LVEF 20-25%, aortic valve was mildly to moderately calcified. LA was mildly dilated.  Cardiac Cath (12/12/2010) -   High-grade distal arteriovenous groove circumflex stenosis in a dominant left circumflex system.   Significant native coronary artery disease with 60% proximal LAD stenosis for prominent septal perforating artery.   Dominant left circumflex coronary artery with 50% proximal stenosis, 70-80% stenosis in the proximal portion of the marginal 1 vessel, 90% eccentric stenosis in the region of a small and more distal marginal vessel, which may be occluded.   Small nondominant right coronary artery.   Patent LIMA to LAD.   Patent vein graft to the obtuse marginal 1 vessel with mild 20% narrowing in the proximal third of the graft.   Occluded vein graft, with uncertain destination but possibly supplying a distal  circumflex marginal vessel.   PCI of distal atrioventricular groove circumflex with ultimate PTCA and stenting and insertion of a BMS  RAO ventriculography - markedly dilated left ventricle with severe global hypokinesis and an ejection fraction of 10-15%.    PRN Medications: acetaminophen, acetaminophen, morphine injection, nitroGLYCERIN, ondansetron (ZOFRAN) IV, ondansetron (ZOFRAN) IV    Assessment/ Plan:   Pt is a 77 y.o. 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 Cardiac enzymes have been negative x  3. Pt had emergent cath overnight secondary to ongoing nature of pain . Cath showed high-grade distal arteriovenous groove circumflex stenosis, thought to be the culprit lesion, and required PTCA with BMS to this lesion. Also found to have severe native CAD and markedly dilated left ventricle with severe global hypokinesis and an ejection fraction of 10-15%.     ACS in the setting of known CAD - cath showing culprit lesion to likely be a high-grade distal arteriovenous groove circumflex stenosis. He is now s/p PTCA with BMS to this lesion. Notably, pt still has his cath sheath in, was not pulled secondary to INR therapeutic.   Continue BB, ASA, statin, Plavix  Encourage smoking cessation      Ischemic cardiomyopathy (ejection fraction 10-15% per cath)  Continue ACE-I, consider increase dosage if tolerated by BP soon.  Continue BB.    Lasix of 40mg  BID     Atrial fibrillation, on chronic coumadin - back on Coumadin,  Will give Lovenox teaching and try to get patient home tomorrow.     Non-oxygen dependent COPD - with ongoing tobacco abuse - continue emphasize smoking cessation.   Pt has chronic systolic heart failure.  This admission was for acute coronary syndrome with superimposed chronic CHF.   He did have some degree of acute on chronic systolic heart failure.   Length of Stay: 3

## 2010-12-15 NOTE — Progress Notes (Signed)
Cardiac Rehab 1105-1150 Pt just walked to [place] with wife and states he did not get tired. Completed ed with pt and wife. Encouraged watching salt,daily weights, smoking cessation, heart healthy eating and ex. Permission given to refer to [facility] Phase 2. Pt wants to go home.Liesa Tsan DunlapRN

## 2010-12-15 NOTE — Progress Notes (Signed)
ANTICOAGULATION CONSULT NOTE - Follow Up Consult  Pharmacy Consult for coumadin Indication: atrial fibrillation  Allergies  Allergen Reactions  . Valsartan     Patient Measurements: Height: 5\' 9"  (175.3 cm) Weight: 143 lb 11.8 oz (65.2 kg) IBW/kg (Calculated) : 70.7    Vital Signs: Temp: 97.8 F (36.6 C) (11/20 0700) Temp src: Oral (11/20 0700) BP: 92/55 mmHg (11/20 0700) Pulse Rate: 71  (11/20 0700)  Labs:  Basename 12/15/10 0345 12/14/10 1733 12/14/10 0902 12/14/10 0500 12/13/10 1215 12/13/10 0522 12/13/10 0015 12/12/10 2303 12/12/10 2003 12/12/10 2002  HGB -- -- -- -- -- 12.9* -- -- 15.6 --  HCT -- -- -- -- -- 37.3* -- -- 46.0 43.5  PLT -- -- -- -- -- 151 -- -- -- 178  APTT -- -- -- -- 38* -- -- -- -- 32  LABPROT 16.0* -- 16.5* -- 19.1* -- -- -- -- --  INR 1.25 -- 1.31 -- 1.57* -- -- -- -- --  HEPARINUNFRC 0.34 0.14* -- -- -- -- -- -- -- --  CREATININE 0.80 -- -- 0.92 -- 0.94 -- -- -- --  CKTOTAL -- -- -- -- -- 73 90 93 -- --  CKMB -- -- -- -- -- 4.5* 4.0 4.0 -- --  TROPONINI -- -- -- -- -- <0.30 <0.30 <0.30 -- --   Estimated Creatinine Clearance: 73.6 ml/min (by C-G formula based on Cr of 0.8).   Medications:  Scheduled:    . antiseptic oral rinse  15 mL Mouth Rinse BID  . carvedilol  12.5 mg Oral BID WC  . clopidogrel  75 mg Oral Q breakfast  . digoxin  250 mcg Oral Daily  . ferrous fumarate-b12-vitamic C-folic acid  1 capsule Oral Daily  . furosemide  40 mg Oral BID  . lisinopril  2.5 mg Oral Daily  . PHENObarbital  100 mg Oral QHS  . PHENObarbital  50 mg Oral Daily  . phenytoin  100 mg Oral Daily  . phenytoin  200 mg Oral QHS  . potassium chloride  10 mEq Oral BID  . rosuvastatin  20 mg Oral q1800  . warfarin  7.5 mg Oral ONCE-1800  . DISCONTD: aspirin  81 mg Oral Daily  . DISCONTD: clopidogrel  75 mg Oral Q breakfast  . DISCONTD: PHENobarbital  48.6 mg Oral Daily  . DISCONTD: PHENobarbital  97.2 mg Oral QHS    Assessment: 77 yo on coumadin and  noted s/p cath with BMS. Coumadin restarted 11/19.  Goal of Therapy:  INR=2-3   Plan:  Will continue coumadin 7.5mg  today and follow daily PT/INR.  [name] 12/15/2010,8:37 AM

## 2010-12-15 NOTE — Progress Notes (Signed)
Living better with HF packet given and discussed with patient and family. Patient has scales and CM encouraged daily wts and discussing fluid limitations with MD. Patient states he does not want HH services at this time. Chanese Hartsough, rn,msn(care management)

## 2010-12-15 NOTE — Progress Notes (Signed)
ANTICOAGULATION CONSULT NOTE - Follow Up Consult  Pharmacy Consult for Heparin Indication: atrial fibrillation  Allergies  Allergen Reactions  . Valsartan     Patient Measurements: Height: 5\' 9"  (175.3 cm) Weight: 143 lb 11.8 oz (65.2 kg) IBW/kg (Calculated) : 70.7   Vital Signs: BP: 88/53 mmHg (11/19 1845)  Labs:  Basename 12/15/10 0345 12/14/10 1733 12/14/10 0902 12/14/10 0500 12/13/10 1215 12/13/10 0522 12/13/10 0015 12/12/10 2303 12/12/10 2003 12/12/10 2002  HGB -- -- -- -- -- 12.9* -- -- 15.6 --  HCT -- -- -- -- -- 37.3* -- -- 46.0 43.5  PLT -- -- -- -- -- 151 -- -- -- 178  APTT -- -- -- -- 38* -- -- -- -- 32  LABPROT 16.0* -- 16.5* -- 19.1* -- -- -- -- --  INR 1.25 -- 1.31 -- 1.57* -- -- -- -- --  HEPARINUNFRC 0.34 0.14* -- -- -- -- -- -- -- --  CREATININE 0.80 -- -- 0.92 -- 0.94 -- -- -- --  CKTOTAL -- -- -- -- -- 73 90 93 -- --  CKMB -- -- -- -- -- 4.5* 4.0 4.0 -- --  TROPONINI -- -- -- -- -- <0.30 <0.30 <0.30 -- --   Estimated Creatinine Clearance: 73.6 ml/min (by C-G formula based on Cr of 0.8).   Medications:  Scheduled:    . antiseptic oral rinse  15 mL Mouth Rinse BID  . carvedilol  12.5 mg Oral BID WC  . clopidogrel  75 mg Oral Q breakfast  . digoxin  250 mcg Oral Daily  . ferrous fumarate-b12-vitamic C-folic acid  1 capsule Oral Daily  . furosemide  40 mg Oral BID  . lisinopril  2.5 mg Oral Daily  . PHENObarbital  100 mg Oral QHS  . PHENObarbital  50 mg Oral Daily  . phenytoin  100 mg Oral Daily  . phenytoin  200 mg Oral QHS  . potassium chloride  10 mEq Oral BID  . rosuvastatin  20 mg Oral q1800  . warfarin  7.5 mg Oral ONCE-1800  . DISCONTD: aspirin  81 mg Oral Daily  . DISCONTD: clopidogrel  75 mg Oral Q breakfast  . DISCONTD: enalapril  2.5 mg Oral Daily  . DISCONTD: furosemide  40 mg Intravenous BID  . DISCONTD: PHENobarbital  48.6 mg Oral Daily  . DISCONTD: PHENobarbital  97.2 mg Oral QHS    Assessment: Heparin level =0.34 on 1250  units/hr. That is within desired goal range. Continue same rate. Resume daily AM heparin level and CBC. Goal of Therapy:  Heparin level 0.3-0.7 units/ml   Plan:  infusion rate 1250 units/hr units/hr. Resume AM heparin level and CBC.  Eugene Garnet 12/15/2010,5:42 AM

## 2010-12-15 NOTE — Progress Notes (Deleted)
CARDIAC REHAB PHASE I   PRE:  Rate/Rhythm: 79 SR    BP: sitting 110/66    SaO2:   MODE:  Ambulation: 700 ft   POST:  Rate/Rhythm: 97 SR    BP: sitting 124/68     SaO2:   Tolerated well, no c/o. Feels well. Ed completed. Focused on risk factor modification.  [id number]  Harriet Masson CES, ACSM

## 2010-12-16 LAB — PROTIME-INR: Prothrombin Time: 16.3 seconds — ABNORMAL HIGH (ref 11.6–15.2)

## 2010-12-16 MED ORDER — ENOXAPARIN SODIUM 60 MG/0.6ML ~~LOC~~ SOLN: 60.0000 mg | Freq: Two times a day (BID) | SUBCUTANEOUS | Status: DC

## 2010-12-16 MED ORDER — LISINOPRIL 2.5 MG PO TABS: 2.5000 mg | ORAL_TABLET | Freq: Every day | ORAL | Status: DC

## 2010-12-16 MED ORDER — CLOPIDOGREL BISULFATE 75 MG PO TABS: 75.0000 mg | ORAL_TABLET | Freq: Every day | ORAL | Status: DC

## 2010-12-16 MED ORDER — NITROGLYCERIN 0.4 MG SL SUBL: 0.4000 mg | SUBLINGUAL_TABLET | SUBLINGUAL | Status: DC | PRN

## 2010-12-16 MED ORDER — WARFARIN SODIUM 7.5 MG PO TABS
7.5000 mg | ORAL_TABLET | Freq: Once | ORAL | Status: DC
Start: 2010-12-16 — End: 2010-12-16
  Filled 2010-12-16: qty 1

## 2010-12-16 MED ORDER — ENOXAPARIN (LOVENOX) PATIENT EDUCATION KIT: 1.0000 | PACK | Freq: Once | Status: DC

## 2010-12-16 NOTE — Progress Notes (Signed)
ANTICOAGULATION CONSULT NOTE - Follow Up Consult  Pharmacy Consult for coumadin/lovenox Indication: atrial fibrillation  Patient Measurements: Height: 5\' 9"  (175.3 cm) Weight: 140 lb 6.9 oz (63.7 kg) IBW/kg (Calculated) : 70.7    Vital Signs: Temp: 97.8 F (36.6 C) (11/21 0622) Temp src: Oral (11/21 0622) BP: 92/61 mmHg (11/21 0622) Pulse Rate: 71  (11/21 0622)  Labs:  [name] 12/16/10 0617 12/15/10 0345 12/14/10 1733 12/14/10 0902 12/14/10 0500 12/13/10 1215  HGB -- -- -- -- -- --  HCT -- -- -- -- -- --  PLT -- -- -- -- -- --  APTT -- -- -- -- -- 38*  LABPROT 16.3* 16.0* -- 16.5* -- --  INR 1.29 1.25 -- 1.31 -- --  HEPARINUNFRC 0.24* 0.34 0.14* -- -- --  CREATININE -- 0.80 -- -- 0.92 --  CKTOTAL -- -- -- -- -- --  CKMB -- -- -- -- -- --  TROPONINI -- -- -- -- -- --   Estimated Creatinine Clearance: 71.9 ml/min (by C-G formula based on Cr of 0.8).   Medications:  Scheduled:     . antiseptic oral rinse  15 mL Mouth Rinse BID  . carvedilol  12.5 mg Oral BID WC  . clopidogrel  75 mg Oral Q breakfast  . digoxin  250 mcg Oral Daily  . enoxaparin (LOVENOX) injection  60 mg Subcutaneous Q12H  . enoxaparin   Does not apply Once  . ferrous fumarate-b12-vitamic C-folic acid  1 capsule Oral Daily  . furosemide  40 mg Oral BID  . lisinopril  2.5 mg Oral Daily  . PHENObarbital  100 mg Oral QHS  . PHENObarbital  50 mg Oral Daily  . phenytoin  100 mg Oral Daily  . phenytoin  200 mg Oral QHS  . potassium chloride  10 mEq Oral BID  . rosuvastatin  20 mg Oral q1800  . warfarin  7.5 mg Oral ONCE-1800    Assessment: 77 yo on coumadin and lovenox and noted s/p cath with BMS. Coumadin restarted 11/19 and heparin changed to lovenox 60mg  [place] q12h on 12/15/10.  Patient noted for possible discharge today.  Goal of Therapy:  INR=2-3   Plan:  1) Will continue coumadin 7.5mg  today and follow daily PT/INR. 2) Would discharge on home regimen (Warfarin 5mg  daily except 7.5mg  on  Tues/Sat).as patient was at INR goal at admission.  [name] 12/16/2010,8:20 AM

## 2010-12-16 NOTE — Discharge Summary (Signed)
Patient ID: [name],  MRN: [id number], DOB/AGE: October 31, 1933 77 y.o.  Admit date: 12/12/2010 Discharge date: 12/16/2010  Primary MD: [name], MD Primary Cardiologist: [name] MD  Discharge Diagnoses: Principal Problem:  *Unstable angina s/p PTCA/BMS to distal atrioventricular groove circumflex Active Problems:  DYSLIPIDEMIA  TOBACCO USER Acute on Chronic systolic CHF 2/2 ISCHEMIC CARDIOMYOPATHY s/p AICD w/ EF 10-15% by cath on 12/12/10   History of CORONARY ARTERY BYPASS GRAFT  Atrial fibrillation on chronic coumadin  Allergies Allergies  Allergen Reactions  . Valsartan    Procedures:  12/12/2010 - Left heart catheterization w/ PTCA & BMS to distal atrioventricular groove circumflex High-grade distal arteriovenous groove circumflex stenosis in a dominant left circumflex system.  Significant native coronary artery disease with 60% proximal LAD stenosis for prominent septal perforating artery.  Dominant left circumflex coronary artery with 50% proximal stenosis, 70-80% stenosis in the proximal portion of the marginal 1 vessel, 90% eccentric stenosis in the region of a small and more distal marginal vessel, which may be occluded.  Small nondominant right coronary artery.  Patent LIMA to LAD.  Patent vein graft to the obtuse marginal 1 vessel with mild 20% narrowing in the proximal third of the graft.  Occluded vein graft, with uncertain destination but possibly supplying a distal circumflex marginal vessel.  PCI of distal atrioventricular groove circumflex with ultimate PTCA and stenting and insertion of a BMS  RAO ventriculography - markedly dilated left ventricle with severe global hypokinesis and an ejection fraction of 10-15%.   History of Present Illness: Pt is a 77 y.o. 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   Hospital Course: Upon arrival to the [facility] his EKG was without evidence of acute cardiac ischemia and his initial cardiac enzymes were negative. However, his chest pain continued to be 10/10 despite IV nitroglycerin and an emergent heart catheterization was performed on 12/12/10. Findings are as above with PTCA and placement of BMS to the distal atrioventricular groove circumflex. The patient tolerated the procedure well, but required FFP the next day to reverse his coumadin in order to safely remove his sheath.   He developed acute on chronic systolic CHF with BNP of 3641 on day of admission. He was diuresed and eventually converted to oral lasix and placed back on home dose as well as his ACE and beta blocker.   His ASA was discontinued due to need for chronic coumadin and new initiation of Plavix. His enalapril was changed to lisinopril but kept at the 2.5mg  dose due to his soft blood pressures. He will require anticoagulation bridge with Lovenox as his INR on day of discharge is 1.29. Education was provided and he was given a prescription for enough doses needed until his INR is rechecked on this Friday or next Monday. He received tobacco cessation counseling.  On day of discharge he was doing well without any chest pain or shortness of breath and ambulating without difficulty. He will follow up with Dr. Elease Hashimoto in several weeks and instructed to follow up with his PCP in the next couple of weeks.   Discharge Vitals:  Temp:  [97.8 F (36.6 C)-98.2 F (36.8 C)] 97.8 F (36.6 C) (11/21 0622) Pulse Rate:  [63-71] 71  (11/21 0622) Resp:  [16] 16  (11/21 0622) BP: (92-107)/(61-65)  92/61 mmHg (11/21 0622) SpO2:  [95 %-96 %] 95 % (11/21 0622) Weight:  [63.7 kg (140 lb 6.9 oz)] 140 lb 6.9 oz (63.7 kg) (11/21 0622)  Labs:   Lab Results  Component Value Date   WBC 11.4* 12/13/2010   HGB 12.9* 12/13/2010   HCT 37.3*  12/13/2010   MCV 95.6 12/13/2010   PLT 151 12/13/2010     Lab 12/15/10 0345  NA 136  K 3.6  CL 101  CO2 26  BUN 21  CREATININE 0.80  CALCIUM 8.2*  GLUCOSE 94     12/12/2010 23:03 12/13/2010 00:15 12/13/2010 05:22  CK, MB 4.0 4.0 4.5 (H)  CK Total 93 90 73  Troponin I <0.30 <0.30 <0.30    Lab Results  Component Value Date   CHOL 202* 12/13/2010    HDL 39* 12/13/2010    LDLCALC 135* 12/13/2010    TRIG 141 12/13/2010    Basename 12/16/10 0617 12/15/10 0345 12/14/10 0902  LABPROT 16.3* 16.0* 16.5*  INR 1.29 1.25 1.31    Follow-up Information    Follow up with Elyn Aquas., MD on 01/12/2011. (9:45am)    Contact information:   [facility] [address] [place] [address] [phone]       Follow up with [facility]. Make an appointment in 3 weeks. (Have INR checked on Friday (or Monday if the office is closed Friday))    Contact information:   [address] [facility] [place] [address] [phone]          Discharge Medications:  Current Discharge Medication List    START taking these medications   Details  clopidogrel (PLAVIX) 75 MG tablet Take 1 tablet (75 mg total) by mouth daily with breakfast. Qty: 30 tablet, Refills: 6    enoxaparin (LOVENOX) 60 MG/0.6ML SOLN Inject 0.6 mLs (60 mg total) into the skin every 12 (twelve) hours. Qty: 4 Syringe, Refills: 1    enoxaparin (LOVENOX) KIT 1 kit by Does not apply route once. Qty: 1 kit, Refills: 0    lisinopril (PRINIVIL,ZESTRIL) 2.5 MG tablet Take 1 tablet (2.5 mg total) by mouth daily. Qty: 30 tablet, Refills: 6    nitroGLYCERIN (NITROSTAT) 0.4 MG SL tablet Place 1 tablet (0.4 mg total) under the tongue every 5 (five) minutes as needed for chest pain. Qty: 25 tablet, Refills: 3      CONTINUE these medications which have NOT CHANGED   Details  carvedilol (COREG) 12.5 MG tablet Take 12.5 mg by mouth  2 (two) times daily with a meal.      digoxin (LANOXIN) 0.25 MG tablet Take 250 mcg by mouth daily.      FeFum-FePoly-FA-B Cmp-C-Biot (FOLIVANE-PLUS) CAPS Take 1 capsule by mouth daily.      furosemide (LASIX) 40 MG tablet Take 40 mg by mouth 2 (two) times daily.      omeprazole (PRILOSEC OTC) 20 MG tablet Take 20 mg by mouth daily.      PHENobarbital (LUMINAL) 97.2 MG tablet Take 97.2 mg by mouth as directed. 1/2 tab in AM 1 tab in PM     phenytoin (DILANTIN) 100 MG ER capsule Take by mouth as directed. 1 tab in morning 1 tab at noon 2 tabs at night     potassium chloride (KLOR-CON) 10 MEQ CR tablet Take 10 mEq by mouth daily.      simvastatin (ZOCOR) 80 MG tablet Take 80 mg by mouth at bedtime.  warfarin (COUMADIN) 5 MG tablet Take 5 mg by mouth as directed. Take 1 tablet every day except Tuesdays, and Saturdays. On Tuesdays and Saturdays take 1.5 tablets.      STOP taking these medications     aspirin 325 MG tablet      enalapril (VASOTEC) 2.5 MG tablet         Outstanding Labs/Studies: Needs INR checked on Friday (or Monday if the office is closed Friday)  Duration of Discharge Encounter: Greater than 30 minutes including physician time.  Signed, HOPE, JESSICA, PA-C 12/16/2010, 12:01 PM  Attending Note:   The patient was seen and examined.  Agree with assessment and plan as noted above.  See my note from previously today.  Pt is stable for discharge. He has ambulated up and down the hall 4 times this am.   Alvia Grove., MD, [facility] 12/16/2010, 5:17 PM

## 2010-12-16 NOTE — Progress Notes (Signed)
[name] CARDIOLOGY   Subjective:    Pt is doing well.  Ambulating without any problems. No angina.  Objective:  Vital Signs:   BP 92/61  Pulse 71  Temp(Src) 97.8 F (36.6 C) (Oral)  Resp 16  Ht 5\' 9"  (1.753 m)  Wt 140 lb 6.9 oz (63.7 kg)  BMI 20.74 kg/m2  SpO2 95%      . antiseptic oral rinse  15 mL Mouth Rinse BID  . carvedilol  12.5 mg Oral BID WC  . clopidogrel  75 mg Oral Q breakfast  . digoxin  250 mcg Oral Daily  . enoxaparin (LOVENOX) injection  60 mg Subcutaneous Q12H  . enoxaparin   Does not apply Once  . ferrous fumarate-b12-vitamic C-folic acid  1 capsule Oral Daily  . furosemide  40 mg Oral BID  . lisinopril  2.5 mg Oral Daily  . PHENObarbital  100 mg Oral QHS  . PHENObarbital  50 mg Oral Daily  . phenytoin  100 mg Oral Daily  . phenytoin  200 mg Oral QHS  . potassium chloride  10 mEq Oral BID  . rosuvastatin  20 mg Oral q1800  . warfarin  7.5 mg Oral ONCE-1800  . warfarin  7.5 mg Oral ONCE-1800    Intake/Output:  11/20 0701 - 11/21 0700 In: 1470 [P.O.:1200; I.V.:270] Out: 975 [Urine:975]   Physical Exam: General: Vital signs reviewed and noted. Well-developed, well-nourished, in no acute distress; alert, appropriate and cooperative throughout examination.  Lungs:  Normal respiratory effort. Clear to auscultation BL without crackles. Mild wheezes.  Heart: RRR. S1 and S2 normal without gallop, murmur, or rubs.  Abdomen:  BS normoactive. Soft, Nondistended, non-tender.  No masses or organomegaly.  Extremities: No pretibial edema.     Lab Results  Component Value Date   WBC 11.4* 12/13/2010   HGB 12.9* 12/13/2010   HCT 37.3* 12/13/2010   MCV 95.6 12/13/2010   PLT 151 12/13/2010     Cardiac Enzymes:  Lab 12/13/10 0522 12/13/10 0015 12/12/10 2303  CKTOTAL 73 90 93  CKMB 4.5* 4.0 4.0  CKMBINDEX -- -- --  TROPONINI <0.30 <0.30 <0.30    BNP: Lab 12/12/10 2002  POCBNP 3641.0*    CBG: Lab 12/12/10 2353 12/12/10 2001  GLUCAP 115* 267*      Microbiology: MRSA PCR SCREENING     Status: Negative    Coagulation Studies: Basename 12/13/10 0521 12/12/10 2002  LABPROT 27.5* 23.3*  INR 2.51* 2.03*     Other results:  EKG (12/12/2010) - NSR, paced rhythm  Imaging:  Dg Chest Portable 1 View (12/12/2010) - Mild to moderate congestive heart failure, without significant change.  Last 2D Echo (10/2004) - LV function reduced with LVEF 20-25%, aortic valve was mildly to moderately calcified. LA was mildly dilated.  Cardiac Cath (12/12/2010) -   High-grade distal arteriovenous groove circumflex stenosis in a dominant left circumflex system.   Significant native coronary artery disease with 60% proximal LAD stenosis for prominent septal perforating artery.   Dominant left circumflex coronary artery with 50% proximal stenosis, 70-80% stenosis in the proximal portion of the marginal 1 vessel, 90% eccentric stenosis in the region of a small and more distal marginal vessel, which may be occluded.   Small nondominant right coronary artery.   Patent LIMA to LAD.   Patent vein graft to the obtuse marginal 1 vessel with mild 20% narrowing in the proximal third of the graft.   Occluded vein graft, with uncertain destination but possibly supplying a  distal circumflex marginal vessel.   PCI of distal atrioventricular groove circumflex with ultimate PTCA and stenting and insertion of a BMS  RAO ventriculography - markedly dilated left ventricle with severe global hypokinesis and an ejection fraction of 10-15%.    PRN Medications: acetaminophen, acetaminophen, morphine injection, nitroGLYCERIN, ondansetron (ZOFRAN) IV, ondansetron (ZOFRAN) IV    Assessment/ Plan:   Pt is a 77 y.o. 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 Cardiac enzymes have been negative x  3. Pt had emergent cath overnight secondary to ongoing nature of pain . Cath showed high-grade distal arteriovenous groove circumflex stenosis, thought to be the culprit lesion, and required PTCA with BMS to this lesion. Also found to have severe native CAD and markedly dilated left ventricle with severe global hypokinesis and an ejection fraction of 10-15%.     ACS in the setting of known CAD - cath showing culprit lesion to likely be a high-grade distal arteriovenous groove circumflex stenosis. He is now s/p PTCA with BMS to this lesion. Notably, pt still has his cath sheath in, was not pulled secondary to INR therapeutic.   Continue BB, ASA, statin, Plavix  Encourage smoking cessation      Ischemic cardiomyopathy (ejection fraction 10-15% per cath)  Continue ACE-I, consider increase dosage if tolerated by BP soon.  Continue BB.    Lasix of 40mg  BID    Atrial fibrillation, on chronic coumadin - back on Coumadin,  Pt has received Lovenox teaching and will take Lovenox until Friday AM ( will need 4 doses or 60 mg syringes).  He will go to his primary MD to have INR checked Friday ( or Monday if the office is closes Friday) .       Non-oxygen dependent COPD - with ongoing tobacco abuse - continue emphasize smoking cessation.   Pt has chronic systolic heart failure.  This admission was for acute coronary syndrome with superimposed chronic CHF.   He did have some degree of acute on chronic systolic heart failure.   Length of Stay: 4  [name], Montez Hageman., MD, [facility] 12/16/2010, 8:39 AM

## 2011-01-04 ENCOUNTER — Telehealth: Payer: Self-pay | Admitting: Cardiovascular Disease

## 2011-01-12 ENCOUNTER — Other Ambulatory Visit: Payer: Self-pay | Admitting: *Deleted

## 2011-01-12 ENCOUNTER — Encounter: Payer: Self-pay | Admitting: Cardiovascular Disease

## 2011-01-12 ENCOUNTER — Ambulatory Visit (INDEPENDENT_AMBULATORY_CARE_PROVIDER_SITE_OTHER): Payer: 59 | Admitting: Cardiovascular Disease

## 2011-01-12 DIAGNOSIS — I2510 Atherosclerotic heart disease of native coronary artery without angina pectoris: Secondary | ICD-10-CM

## 2011-01-12 DIAGNOSIS — I2589 Other forms of chronic ischemic heart disease: Secondary | ICD-10-CM

## 2011-01-12 MED ORDER — CARVEDILOL 6.25 MG PO TABS: 6.2500 mg | ORAL_TABLET | Freq: Two times a day (BID) | ORAL | Status: DC

## 2011-01-12 MED ORDER — NITROGLYCERIN 0.4 MG SL SUBL: 0.4000 mg | SUBLINGUAL_TABLET | SUBLINGUAL | Status: AC | PRN

## 2011-01-12 NOTE — Telephone Encounter (Signed)
Fax Received. Refill Completed. Tereka Thorley Chowoe (R.M.A)   

## 2011-01-12 NOTE — Patient Instructions (Signed)
Your physician recommends that you schedule a follow-up appointment in: 3 months  Your physician has recommended you make the following change in your medication:   Change / Coreg 6.25mg  one tablet twice a day 12 hours apart.

## 2011-01-12 NOTE — Progress Notes (Signed)
[name] Date of Birth  1933/04/12 [facility] [address]    [address] [place], [place]  [address] [phone]  Fax  [phone]  History of Present Illness:  [name] is a 77 year old gentleman with a history of coronary artery disease and congestive heart failure. He also has history of COPD.  He was recently hospitalized for an acute non-ST segment elevation myocardial infarction. He had a bare-metal stent placed in his left circumflex artery by Dr. Daphene Jaeger.  He was found to have patent LIMA and patent LAD. This  graft to the obtuse marginal artery was patent. Right coronary artery was small nondominant. The left ventricular systolic function was severely depressed with an EF of 10-15%.   He was discharged from the hospital several days later.  He stopped smoking after that hospitalization.   He's had chronic  lower right-sided back pain since the cardiac catheterization.    He had recurrent chest pain while visiting up in [place].  He ruled out for MI and was discharged on home O2 - 2 LPM.  He's no longer having any further episodes of chest pain. He continues to have significant shortness of breath even on 2 L nasal cannula. He also can place about his lower right-sided back pain.  His medical Dr. decreased his carvedilol to 12.5 mg once a day  Current Outpatient Prescriptions on File Prior to Visit  Medication Sig Dispense Refill  . carvedilol (COREG) 12.5 MG tablet Take 12.5 mg by mouth daily.       . clopidogrel (PLAVIX) 75 MG tablet Take 1 tablet (75 mg total) by mouth daily with breakfast.  30 tablet  6  . digoxin (LANOXIN) 0.25 MG tablet Take 250 mcg by mouth daily.        Marland Kitchen FeFum-FePoly-FA-B Cmp-C-Biot (FOLIVANE-PLUS) CAPS Take 1 capsule by mouth daily.        . furosemide (LASIX) 40 MG tablet Take 40 mg by mouth 2 (two) times daily.        Marland Kitchen lisinopril (PRINIVIL,ZESTRIL) 2.5 MG tablet Take 1 tablet (2.5 mg total) by mouth daily.  30 tablet  6  .  omeprazole (PRILOSEC OTC) 20 MG tablet Take 20 mg by mouth daily.        Marland Kitchen PHENobarbital (LUMINAL) 97.2 MG tablet Take 97.2 mg by mouth as directed. 1/2 tab in AM 1 tab in PM       . phenytoin (DILANTIN) 100 MG ER capsule Take by mouth as directed. 1 tab in morning 1 tab at noon 2 tabs at night       . potassium chloride (KLOR-CON) 10 MEQ CR tablet Take 10 mEq by mouth 2 (two) times daily.       . simvastatin (ZOCOR) 80 MG tablet Take 80 mg by mouth. Taking Every Other Day      . warfarin (COUMADIN) 5 MG tablet Take 5 mg by mouth as directed. Taking As Directed        Allergies  Allergen Reactions  . Valsartan     Past Medical History  Diagnosis Date  . Tobacco use disorder   . Esophageal reflux   . Unspecified cerebral artery occlusion with cerebral infarction   . Other and unspecified hyperlipidemia   . Other convulsions   . Postsurgical aortocoronary bypass status   . Other malaise and fatigue   . Cardiac pacemaker in situ   . Other specified forms of chronic ischemic heart disease   . Coronary artery disease  MI x2 in 1989 with subsequent CABG  . Congestive heart failure, unspecified     Ischemic cardiomyopathy EF 20-25%  . Myocardial infarction 1989  . Sinoatrial node dysfunction   . Atrial fibrillation   . Stroke   . Angina   . COPD (chronic obstructive pulmonary disease)   . Anemia     Past Surgical History  Procedure Date  . Coronary artery bypass graft   . Cardiac catheterization   . Insert / replace / remove pacemaker     AICD 2008    History  Smoking status  . Current Everyday Smoker -- 1.0 packs/day for 60 years  . Types: Cigarettes  Smokeless tobacco  . Not on file  Comment: Started on nicotine patch    History  Alcohol Use No    Family History  Problem Relation Age of Onset  . Heart disease Mother   . Heart disease Father     Reviw of Systems:  Reviewed in the HPI.  All other systems are negative.  Physical Exam: BP 93/61  Pulse 72   Ht 5\' 8"  (1.727 m)  Wt 147 lb 12.8 oz (67.042 kg)  BMI 22.47 kg/m2 The patient is alert and oriented x 3.  The mood and affect are normal.   Skin: warm and dry.  Color is normal.    HEENT:   Pound/AT, normal carotids, no JVD  Lungs: He has bilateral rales, left greater than right.   Heart: RR, soft murmur    Abdomen: soft, + BS, nontender, no rebound  Extremities:  No c/c/e  Neuro:  nonfocal.    Oximetry: His room air O2 saturation was around 97%. With ambulation his O2 sat was as low as 91 but it did not decreased to below 90.   Assessment / Plan:

## 2011-01-12 NOTE — Progress Notes (Signed)
Ambulated pt 166ft on room air, sats went as low as 91% and stayed in the 95%- 97% mainly and while sitting.

## 2011-01-13 NOTE — Assessment & Plan Note (Signed)
[name] status post PTCA and stenting. He had an episode repeat chest pain or shortness of breath while he was up in [place]. He ruled out for myocardial infarction. He was placed on home oxygen.  He continues to have severe shortness breath. I'm sure that he has severe COPD. He stopped smoking last month after his last myocardial infarction.  I told his wife that his condition will probably slowly deteriorate. We will try to support him the best we can.

## 2011-01-13 NOTE — Assessment & Plan Note (Signed)
He has an ischemic cardiopathy with an ejection fraction of around 25-30%. He also has severe COPD. We'll continue with his same medications.

## 2011-01-29 ENCOUNTER — Inpatient Hospital Stay (HOSPITAL_COMMUNITY)
Admission: EM | Admit: 2011-01-29 | Discharge: 2011-02-05 | DRG: 287 | Disposition: A | Discharge status: Home / Self Care | Payer: Medicare Other | Attending: Internal Medicine | Admitting: Internal Medicine

## 2011-01-29 ENCOUNTER — Encounter (HOSPITAL_COMMUNITY): Payer: Self-pay | Admitting: *Deleted

## 2011-01-29 ENCOUNTER — Other Ambulatory Visit: Payer: Self-pay

## 2011-01-29 ENCOUNTER — Emergency Department (HOSPITAL_COMMUNITY): Payer: Medicare Other

## 2011-01-29 DIAGNOSIS — Z9861 Coronary angioplasty status: Secondary | ICD-10-CM

## 2011-01-29 DIAGNOSIS — I4891 Unspecified atrial fibrillation: Secondary | ICD-10-CM | POA: Diagnosis present

## 2011-01-29 DIAGNOSIS — K219 Gastro-esophageal reflux disease without esophagitis: Secondary | ICD-10-CM | POA: Diagnosis present

## 2011-01-29 DIAGNOSIS — Z9981 Dependence on supplemental oxygen: Secondary | ICD-10-CM

## 2011-01-29 DIAGNOSIS — I509 Heart failure, unspecified: Secondary | ICD-10-CM

## 2011-01-29 DIAGNOSIS — Z951 Presence of aortocoronary bypass graft: Secondary | ICD-10-CM

## 2011-01-29 DIAGNOSIS — I252 Old myocardial infarction: Secondary | ICD-10-CM

## 2011-01-29 DIAGNOSIS — J449 Chronic obstructive pulmonary disease, unspecified: Secondary | ICD-10-CM | POA: Diagnosis present

## 2011-01-29 DIAGNOSIS — J811 Chronic pulmonary edema: Secondary | ICD-10-CM

## 2011-01-29 DIAGNOSIS — J4489 Other specified chronic obstructive pulmonary disease: Secondary | ICD-10-CM | POA: Diagnosis present

## 2011-01-29 DIAGNOSIS — Z8673 Personal history of transient ischemic attack (TIA), and cerebral infarction without residual deficits: Secondary | ICD-10-CM

## 2011-01-29 DIAGNOSIS — R0602 Shortness of breath: Secondary | ICD-10-CM

## 2011-01-29 DIAGNOSIS — I5023 Acute on chronic systolic (congestive) heart failure: Principal | ICD-10-CM | POA: Diagnosis present

## 2011-01-29 DIAGNOSIS — I2589 Other forms of chronic ischemic heart disease: Secondary | ICD-10-CM | POA: Insufficient documentation

## 2011-01-29 DIAGNOSIS — Z95810 Presence of automatic (implantable) cardiac defibrillator: Secondary | ICD-10-CM | POA: Insufficient documentation

## 2011-01-29 DIAGNOSIS — I2510 Atherosclerotic heart disease of native coronary artery without angina pectoris: Secondary | ICD-10-CM

## 2011-01-29 DIAGNOSIS — F17200 Nicotine dependence, unspecified, uncomplicated: Secondary | ICD-10-CM | POA: Diagnosis present

## 2011-01-29 HISTORY — DX: Shortness of breath: R06.02

## 2011-01-29 HISTORY — DX: Presence of automatic (implantable) cardiac defibrillator: Z95.810

## 2011-01-29 HISTORY — DX: Anxiety disorder, unspecified: F41.9

## 2011-01-29 HISTORY — DX: Pneumonia, unspecified organism: J18.9

## 2011-01-29 HISTORY — DX: Chronic systolic (congestive) heart failure: I50.22

## 2011-01-29 HISTORY — DX: Cardiac murmur, unspecified: R01.1

## 2011-01-29 HISTORY — DX: Unspecified convulsions: R56.9

## 2011-01-29 LAB — CBC
HCT: 39.1 % (ref 39.0–52.0)
Hemoglobin: 13.4 g/dL (ref 13.0–17.0)
MCH: 32.9 pg (ref 26.0–34.0)
MCV: 96.1 fL (ref 78.0–100.0)
Platelets: 231 10*3/uL (ref 150–400)
RBC: 4.07 MIL/uL — ABNORMAL LOW (ref 4.22–5.81)

## 2011-01-29 LAB — BASIC METABOLIC PANEL
BUN: 25 mg/dL — ABNORMAL HIGH (ref 6–23)
Calcium: 8.9 mg/dL (ref 8.4–10.5)
Creatinine, Ser: 1.12 mg/dL (ref 0.50–1.35)
GFR calc non Af Amer: 62 mL/min — ABNORMAL LOW (ref >90)
Glucose, Bld: 229 mg/dL — ABNORMAL HIGH (ref 70–99)

## 2011-01-29 LAB — POCT I-STAT 3, ART BLOOD GAS (G3+)
Acid-base deficit: 3 mmol/L — ABNORMAL HIGH (ref 0.0–2.0)
Bicarbonate: 23.5 mEq/L (ref 20.0–24.0)
O2 Saturation: 96 %
TCO2: 25 mmol/L (ref 0–100)
pH, Arterial: 7.314 — ABNORMAL LOW (ref 7.350–7.450)

## 2011-01-29 LAB — DIFFERENTIAL
Eosinophils Absolute: 0.1 10*3/uL (ref 0.0–0.7)
Eosinophils Relative: 1 % (ref 0–5)
Lymphs Abs: 3.5 10*3/uL (ref 0.7–4.0)
Monocytes Absolute: 0.8 10*3/uL (ref 0.1–1.0)
Monocytes Relative: 7 % (ref 3–12)

## 2011-01-29 MED ORDER — ALBUTEROL SULFATE (5 MG/ML) 0.5% IN NEBU
5.0000 mg | INHALATION_SOLUTION | Freq: Once | RESPIRATORY_TRACT | Status: AC
  Administered 2011-01-29: 5 mg via RESPIRATORY_TRACT
  Filled 2011-01-29: qty 1

## 2011-01-29 MED ORDER — IPRATROPIUM BROMIDE 0.02 % IN SOLN
0.5000 mg | Freq: Once | RESPIRATORY_TRACT | Status: AC
  Administered 2011-01-29: 0.5 mg via RESPIRATORY_TRACT
  Filled 2011-01-29: qty 2.5

## 2011-01-29 MED ORDER — METHYLPREDNISOLONE SODIUM SUCC 125 MG IJ SOLR
125.0000 mg | Freq: Once | INTRAMUSCULAR | Status: AC
  Administered 2011-01-29: 125 mg via INTRAVENOUS
  Filled 2011-01-29: qty 2

## 2011-01-29 MED ORDER — NITROGLYCERIN 0.4 MG/HR TD PT24
0.4000 mg | MEDICATED_PATCH | Freq: Every day | TRANSDERMAL | Status: DC
  Administered 2011-01-29: 0.4 mg via TRANSDERMAL
  Filled 2011-01-29: qty 1

## 2011-01-29 MED ORDER — FUROSEMIDE 10 MG/ML IJ SOLN
40.0000 mg | Freq: Once | INTRAMUSCULAR | Status: AC
  Administered 2011-01-29: 40 mg via INTRAVENOUS
  Filled 2011-01-29 (×2): qty 4

## 2011-01-29 MED ORDER — MORPHINE SULFATE 4 MG/ML IJ SOLN
4.0000 mg | Freq: Once | INTRAMUSCULAR | Status: AC
  Administered 2011-01-30: 4 mg via INTRAVENOUS
  Filled 2011-01-29: qty 1

## 2011-01-29 NOTE — ED Notes (Signed)
Pt called EMS for chest pain and sob. Pt in respiratory distress. Pt moaning in pain.

## 2011-01-29 NOTE — ED Provider Notes (Signed)
History     CSN: [id number]  Arrival date & time 01/29/11  2142   First MD Initiated Contact with Patient 01/29/11 2143      Chief Complaint  Patient presents with  . Chest Pain     Patient is a 78 y.o. male presenting with chest pain. The history is provided by the patient and the EMS personnel. The history is limited by the condition of the patient.  Chest Pain Episode onset: today. Chest pain occurs constantly. The chest pain is worsening. Associated with: nothing. The severity of the pain is severe. The quality of the pain is described as aching. Chest pain is worsened by stress. Primary symptoms include shortness of breath.  Associated symptoms include diaphoresis.   nothing improves his symptoms  Per EMS, pt called out for chest pain and shortness of breath Pt has h/o CAD, also has ICD in place but no ICD shocks reported No other details are known, pt very ill on arrival  Past Medical History  Diagnosis Date  . Tobacco use disorder   . Esophageal reflux   . Unspecified cerebral artery occlusion with cerebral infarction   . Other and unspecified hyperlipidemia   . Other convulsions   . Postsurgical aortocoronary bypass status   . Other malaise and fatigue   . Cardiac pacemaker in situ   . Other specified forms of chronic ischemic heart disease   . Coronary artery disease     MI x2 in 1989 with subsequent CABG  . Congestive heart failure, unspecified     Ischemic cardiomyopathy EF 20-25%  . Myocardial infarction 1989  . Sinoatrial node dysfunction   . Campath-induced atrial fibrillation   . Stroke   . Angina   . COPD (chronic obstructive pulmonary disease)   . Anemia     Past Surgical History  Procedure Date  . Coronary artery bypass graft   . Cardiac catheterization   . Insert / replace / remove pacemaker     AICD 2008    Family History  Problem Relation Age of Onset  . Heart disease Mother   . Heart disease Father     History  Substance Use Topics    . Smoking status: Current Everyday Smoker -- 1.0 packs/day for 60 years    Types: Cigarettes  . Smokeless tobacco: Not on file   Comment: Started on nicotine patch  . Alcohol Use: No      Review of Systems  Unable to perform ROS: Unstable vital signs  Constitutional: Positive for diaphoresis.  Respiratory: Positive for shortness of breath.   Cardiovascular: Positive for chest pain.    Allergies  Valsartan  Home Medications   Current Outpatient Rx  Name Route Sig Dispense Refill  . CARVEDILOL 12.5 MG PO TABS Oral Take 12.5 mg by mouth daily.      Marland Kitchen CLOPIDOGREL BISULFATE 75 MG PO TABS Oral Take 75 mg by mouth every morning.      Marland Kitchen DIGOXIN 0.25 MG PO TABS Oral Take 250 mcg by mouth daily.      Marland Kitchen FOLIVANE-PLUS PO CAPS Oral Take 1 capsule by mouth daily.      . FUROSEMIDE 40 MG PO TABS Oral Take 40 mg by mouth 2 (two) times daily.     Marland Kitchen LISINOPRIL 2.5 MG PO TABS Oral Take 2.5 mg by mouth daily.      Marland Kitchen NITROGLYCERIN 0.4 MG SL SUBL Sublingual Place 1 tablet (0.4 mg total) under the tongue every 5 (five) minutes as  needed for chest pain. 25 tablet 3  . OMEPRAZOLE MAGNESIUM 20 MG PO TBEC Oral Take 20 mg by mouth daily.      Marland Kitchen PHENOBARBITAL 97.2 MG PO TABS Oral Take 48.6-97.2 mg by mouth 2 (two) times daily. Patient takes 1/2 tablet in the morning and 1 tablet in the evening    . PHENYTOIN SODIUM EXTENDED 100 MG PO CAPS Oral Take 100-200 mg by mouth 3 (three) times daily. Patient takes 1 tablet in the morning, 1 tablet at noon and 2 tablets in the evening    . POTASSIUM CHLORIDE 10 MEQ PO TBCR Oral Take 10 mEq by mouth daily.     Marland Kitchen SIMVASTATIN 80 MG PO TABS Oral Take 80 mg by mouth every other day.     . WARFARIN SODIUM 7.5 MG PO TABS Oral Take 7.5 mg by mouth daily. Patient currently taking 7.5 mg daily       BP 133/85  Pulse 79  Resp 18  SpO2 94% BP 104/71  Pulse 73  Resp 24  SpO2 97%   Physical Exam CONSTITUTIONAL: ill appearing, distress, diaphoretic HEAD AND FACE:  Normocephalic/atraumatic EYES: EOMI/PERRL ENMT: Mucous membranes dry NECK: supple no meningeal signs SPINE:entire spine nontender CV: tachycardic, no loud murmurs noted LUNGS: tachypneic, rales noted bilaterally, wheezing noted bilaterally ABDOMEN: soft, nontender, no rebound or guarding GU:no cva tenderness NEURO: Pt is awake/alert, moves all extremitiesx4, anxious EXTREMITIES: pulses normal, full ROM SKIN: diaphoretic PSYCH: anxious  ED Course  Procedures   CRITICAL CARE Performed by: Joya Gaskins   Total critical care time: 37  Critical care time was exclusive of separately billable procedures and treating other patients.  Critical care was necessary to treat or prevent imminent or life-threatening deterioration.  Critical care was time spent personally by me on the following activities: development of treatment plan with patient and/or surrogate as well as nursing, discussions with consultants, evaluation of patient's response to treatment, examination of patient, obtaining history from patient or surrogate, ordering and performing treatments and interventions, ordering and review of laboratory studies, ordering and review of radiographic studies, pulse oximetry and re-evaluation of patient's condition.   Labs Reviewed  CBC - Abnormal; Notable for the following:    WBC 11.4 (*)    RBC 4.07 (*)    All other components within normal limits  BASIC METABOLIC PANEL - Abnormal; Notable for the following:    Glucose, Bld 229 (*)    BUN 25 (*)    GFR calc non Af Amer 62 (*)    GFR calc Af Amer 72 (*)    All other components within normal limits  PRO B NATRIURETIC PEPTIDE - Abnormal; Notable for the following:    Pro B Natriuretic peptide (BNP) 4870.0 (*)    All other components within normal limits  PROTIME-INR - Abnormal; Notable for the following:    Prothrombin Time 27.4 (*)    INR 2.50 (*)    All other components within normal limits  POCT I-STAT 3, BLOOD GAS  (G3+) - Abnormal; Notable for the following:    pH, Arterial 7.314 (*)    pCO2 arterial 46.2 (*)    Acid-base deficit 3.0 (*)    All other components within normal limits  DIFFERENTIAL  DIGOXIN LEVEL  POCT I-STAT TROPONIN I  I-STAT TROPONIN I  BLOOD GAS, ARTERIAL   Dg Chest Port 1 View  01/29/2011  *RADIOLOGY REPORT*  Clinical Data: Shortness of breath.  PORTABLE CHEST - 1 VIEW  Comparison: 12/14/2010.  Findings: Enlarged cardiac silhouette with an interval increase in size.  Interval mild diffuse bilateral airspace opacity.  More pronounced airspace opacity in the medial left lower lobe.  Stable left subclavian pacer and AICD leads.  Stable post CABG changes. Diffuse osteopenia.  IMPRESSION:  1.  Progressive cardiomegaly with interval changes of congestive heart failure. 2.  Left lower lobe atelectasis or pneumonia.  Original Report Authenticated By: Darrol Angel, M.D.     1. Pulmonary edema   2. CHF (congestive heart failure)   3. Chest pain    Pt ill appearing on arrival, diaphoretic, tachypneic, bipap placed immediately  11:16 PM Pt appears improved after nitrates and bipap D/w cardiology, will see patient  11:38 PM Pt improved, vitals improved Cardiology now in the ED At this point, feel this is more likely CHF, less likely PE/Pneumonia  11:53 PM Pt stable, abd soft, but reporting persistent CP Morphine ordered   MDM  Nursing notes reviewed and considered in documentation All labs/vitals reviewed and considered xrays reviewed and considered Previous records reviewed and considered        Date: 01/29/2011  Rate: 103  Rhythm: indeterminate  QRS Axis: normal  Intervals: normal  ST/T Wave abnormalities: nonspecific ST changes  Conduction Disutrbances:nonspecific intraventricular conduction delay  Narrative Interpretation:   Old EKG Reviewed: unchanged No pacemaker spikes noted on this EKG   Joya Gaskins, MD 01/29/11 2353

## 2011-01-29 NOTE — ED Notes (Signed)
EKG shown to Dr. Wickline, copies in the chart 

## 2011-01-29 NOTE — H&P (Signed)
[name] is an 78 y.o. male.    Chief Complaint: Shortness of breath  HPI: 78 y/o male with a history of CAD s/p CABG, severe left ventricular systolic dysfunction with LVEF of 10-15% by ventriculogram 12/12/2010 presenting to the ER with shortness of breath.  He was previously admitted to the hospital for chest discomfort in 11/2010.  He underwent a cardiac cath that showed a high grade distal arteriovenous groove circumflex stenosis in a dominant left circumflex system that was stented with a BMS.  His LIMA->LAD was patent, SVG->OM1 was patent, and there was an occluded vein graft with uncertain destination (possibly supplying a distal circumflex marginal vessel).  His LVEF was 10-15% by ventriculogram.  He was recently seen by Dr. Andree Coss 01/12/2011 for follow-up visit.  He was noted to have baseline shortness of breath even on 2 L home oxygen.  He has a sedentary lifestyle and is only able to walk around the house before getting shortness of breath, and he sometimes sleep on a recliner at home when he is not able to lay down flat.  He missed his Furosemide this morning and now complain of worsening of baseline shortness of breath as well as chest heaviness.  He denies radiation, nausea or vomiting or diaphoresis.  In ER he was found to be in pulmonary edema.  He was placed on Bi-PAP with improvement of his shortness of breath.  He is currently chest pain free.      Imaging:  Dg ChAnd est Portable 1 View (12/12/2010) - Mild to moderate congestive heart failure, without significant change. Last 2D Echo (10/2004) - LV function reduced with LVEF 20-25%, aortic valve was mildly to moderately calcified. LA was mildly dilated. Cardiac Cath (12/12/2010) -  High-grade distal arteriovenous groove circumflex stenosis in a dominant left circumflex system.  Significant native coronary artery disease with 60% proximal LAD stenosis for prominent septal perforating artery.  Dominant left circumflex coronary  artery with 50% proximal stenosis, 70-80% stenosis in the proximal portion of the marginal 1 vessel, 90% eccentric stenosis in the region of a small and more distal marginal vessel, which may be occluded.  Small nondominant right coronary artery.  Patent LIMA to LAD.  Patent vein graft to the obtuse marginal 1 vessel with mild 20% narrowing in the proximal third of the graft.  Occluded vein graft, with uncertain destination but possibly supplying a distal circumflex marginal vessel.  PCI of distal atrioventricular groove circumflex with ultimate PTCA and stenting and insertion of a BMS  RAO ventriculography - markedly dilated left ventricle with severe global hypokinesis and an ejection fraction of 10-15%.    Past Medical History  Diagnosis Date  . Tobacco use disorder   . Esophageal reflux   . Unspecified cerebral artery occlusion with cerebral infarction   . Other and unspecified hyperlipidemia   . Other convulsions   . Postsurgical aortocoronary bypass status   . Other malaise and fatigue   . Cardiac pacemaker in situ   . Other specified forms of chronic ischemic heart disease   . Coronary artery disease     MI x2 in 1989 with subsequent CABG  . Congestive heart failure, unspecified     Ischemic cardiomyopathy EF 20-25%  . Myocardial infarction 1989  . Sinoatrial node dysfunction   . Campath-induced atrial fibrillation   . Stroke   . Angina   . COPD (chronic obstructive pulmonary disease)   . Anemia     Past Surgical History  Procedure Date  .  Coronary artery bypass graft   . Cardiac catheterization   . Insert / replace / remove pacemaker     AICD 2008    Family History  Problem Relation Age of Onset  . Heart disease Mother   . Heart disease Father    Social History:  reports that he has been smoking Cigarettes.  He has a 60 pack-year smoking history. He does not have any smokeless tobacco history on file. He reports that he does not drink alcohol or use illicit  drugs.  Allergies:  Allergies  Allergen Reactions  . Valsartan     Medications Prior to Admission  Medication Dose Route Frequency Provider Last Rate Last Dose  . albuterol (PROVENTIL) (5 MG/ML) 0.5% nebulizer solution 5 mg  5 mg Nebulization Once Joya Gaskins, MD   5 mg at 01/29/11 2156  . furosemide (LASIX) injection 40 mg  40 mg Intravenous Once Joya Gaskins, MD   40 mg at 01/29/11 2253  . ipratropium (ATROVENT) nebulizer solution 0.5 mg  0.5 mg Nebulization Once Joya Gaskins, MD   0.5 mg at 01/29/11 2156  . methylPREDNISolone sodium succinate (SOLU-MEDROL) 125 MG injection 125 mg  125 mg Intravenous Once Joya Gaskins, MD   125 mg at 01/29/11 2156  . nitroGLYCERIN (NITRODUR - Dosed in mg/24 hr) patch 0.4 mg  0.4 mg Transdermal Daily Joya Gaskins, MD   0.4 mg at 01/29/11 2254   Medications Prior to Admission  Medication Sig Dispense Refill  . clopidogrel (PLAVIX) 75 MG tablet Take 75 mg by mouth every morning.        . digoxin (LANOXIN) 0.25 MG tablet Take 250 mcg by mouth daily.        Marland Kitchen FeFum-FePoly-FA-B Cmp-C-Biot (FOLIVANE-PLUS) CAPS Take 1 capsule by mouth daily.        . furosemide (LASIX) 40 MG tablet Take 40 mg by mouth 2 (two) times daily.       Marland Kitchen lisinopril (PRINIVIL,ZESTRIL) 2.5 MG tablet Take 2.5 mg by mouth daily.        . nitroGLYCERIN (NITROSTAT) 0.4 MG SL tablet Place 1 tablet (0.4 mg total) under the tongue every 5 (five) minutes as needed for chest pain.  25 tablet  3  . omeprazole (PRILOSEC OTC) 20 MG tablet Take 20 mg by mouth daily.        Marland Kitchen PHENobarbital (LUMINAL) 97.2 MG tablet Take 48.6-97.2 mg by mouth 2 (two) times daily. Patient takes 1/2 tablet in the morning and 1 tablet in the evening      . phenytoin (DILANTIN) 100 MG ER capsule Take 100-200 mg by mouth 3 (three) times daily. Patient takes 1 tablet in the morning, 1 tablet at noon and 2 tablets in the evening      . potassium chloride (KLOR-CON) 10 MEQ CR tablet Take 10 mEq by mouth  daily.       . simvastatin (ZOCOR) 80 MG tablet Take 80 mg by mouth every other day.         Results for orders placed during the hospital encounter of 01/29/11 (from the past 48 hour(s))  CBC     Status: Abnormal   Collection Time   01/29/11  9:56 PM      Component Value Range Comment   WBC 11.4 (*) 4.0 - 10.5 (K/uL)    RBC 4.07 (*) 4.22 - 5.81 (MIL/uL)    Hemoglobin 13.4  13.0 - 17.0 (g/dL)    HCT 96.0  45.4 -  52.0 (%)    MCV 96.1  78.0 - 100.0 (fL)    MCH 32.9  26.0 - 34.0 (pg)    MCHC 34.3  30.0 - 36.0 (g/dL)    RDW 16.1  09.6 - 04.5 (%)    Platelets 231  150 - 400 (K/uL)   DIFFERENTIAL     Status: Normal   Collection Time   01/29/11  9:56 PM      Component Value Range Comment   Neutrophils Relative 61  43 - 77 (%)    Neutro Abs 7.0  1.7 - 7.7 (K/uL)    Lymphocytes Relative 31  12 - 46 (%)    Lymphs Abs 3.5  0.7 - 4.0 (K/uL)    Monocytes Relative 7  3 - 12 (%)    Monocytes Absolute 0.8  0.1 - 1.0 (K/uL)    Eosinophils Relative 1  0 - 5 (%)    Eosinophils Absolute 0.1  0.0 - 0.7 (K/uL)    Basophils Relative 0  0 - 1 (%)    Basophils Absolute 0.1  0.0 - 0.1 (K/uL)   BASIC METABOLIC PANEL     Status: Abnormal   Collection Time   01/29/11  9:56 PM      Component Value Range Comment   Sodium 139  135 - 145 (mEq/L)    Potassium 4.3  3.5 - 5.1 (mEq/L)    Chloride 98  96 - 112 (mEq/L)    CO2 25  19 - 32 (mEq/L)    Glucose, Bld 229 (*) 70 - 99 (mg/dL)    BUN 25 (*) 6 - 23 (mg/dL)    Creatinine, Ser 4.09  0.50 - 1.35 (mg/dL)    Calcium 8.9  8.4 - 10.5 (mg/dL)    GFR calc non Af Amer 62 (*) >90 (mL/min)    GFR calc Af Amer 72 (*) >90 (mL/min)   PRO B NATRIURETIC PEPTIDE     Status: Abnormal   Collection Time   01/29/11  9:56 PM      Component Value Range Comment   Pro B Natriuretic peptide (BNP) 4870.0 (*) 0 - 450 (pg/mL)   DIGOXIN LEVEL     Status: Normal   Collection Time   01/29/11  9:56 PM      Component Value Range Comment   Digoxin Level 0.8  0.8 - 2.0 (ng/mL)     PROTIME-INR     Status: Abnormal   Collection Time   01/29/11  9:56 PM      Component Value Range Comment   Prothrombin Time 27.4 (*) 11.6 - 15.2 (seconds)    INR 2.50 (*) 0.00 - 1.49    POCT I-STAT TROPONIN I     Status: Normal   Collection Time   01/29/11 10:14 PM      Component Value Range Comment   Troponin i, poc 0.05  0.00 - 0.08 (ng/mL)    Comment 3            POCT I-STAT 3, BLOOD GAS (G3+)     Status: Abnormal   Collection Time   01/29/11 10:17 PM      Component Value Range Comment   pH, Arterial 7.314 (*) 7.350 - 7.450     pCO2 arterial 46.2 (*) 35.0 - 45.0 (mmHg)    pO2, Arterial 88.0  80.0 - 100.0 (mmHg)    Bicarbonate 23.5  20.0 - 24.0 (mEq/L)    TCO2 25  0 - 100 (mmol/L)    O2 Saturation  96.0      Acid-base deficit 3.0 (*) 0.0 - 2.0 (mmol/L)    Collection site RADIAL, ALLEN'S TEST ACCEPTABLE      Sample type ARTERIAL      Dg Chest Port 1 View  01/29/2011  *RADIOLOGY REPORT*  Clinical Data: Shortness of breath.  PORTABLE CHEST - 1 VIEW  Comparison: 12/14/2010.  Findings: Enlarged cardiac silhouette with an interval increase in size.  Interval mild diffuse bilateral airspace opacity.  More pronounced airspace opacity in the medial left lower lobe.  Stable left subclavian pacer and AICD leads.  Stable post CABG changes. Diffuse osteopenia.  IMPRESSION:  1.  Progressive cardiomegaly with interval changes of congestive heart failure. 2.  Left lower lobe atelectasis or pneumonia.  Original Report Authenticated By: Darrol Angel, M.D.    Review of Systems  Constitutional: Negative.   HENT: Negative.   Eyes: Negative.   Respiratory: Negative.   Cardiovascular: Positive for chest pain and orthopnea. Negative for palpitations, claudication, leg swelling and PND.  Gastrointestinal: Negative.   Genitourinary: Negative.   Musculoskeletal: Negative.   Skin: Negative.   Neurological: Negative.   Psychiatric/Behavioral: Negative.     Blood pressure 104/71, pulse 73, resp. rate  24, SpO2 97.00%. Physical Exam  Constitutional: He is oriented to person, place, and time. He appears well-developed and well-nourished.  HENT:  Head: Normocephalic.  Eyes: EOM are normal.  Neck: Normal range of motion. Neck supple. JVD present. No tracheal deviation present. No thyromegaly present.  Cardiovascular: An irregularly irregular rhythm present. PMI is displaced.  Exam reveals no gallop and no friction rub.   No murmur heard. Respiratory: Effort normal. No stridor. No respiratory distress. He has no wheezes. He has rales. He exhibits no tenderness.  GI: Soft. Bowel sounds are normal.  Musculoskeletal: Normal range of motion.  Neurological: He is oriented to person, place, and time.  Skin: Skin is dry.  Psychiatric: He has a normal mood and affect.     Assessment/Plan  1. CHF excerebration (patient missed taking his Furosemide) 2. CAD s/p CABG with recent PCI to distal atrioventricular groove circumflex  3. Severe oxygen-dependent COPD 4. AFIB with normal ventricular response rate  I will admit the patient to the hospital for treatment of CHF excerebration. I will start him on intravenous Furosemide tonight and continue his heart failure medications.  He will be ruled out for MI with 3-sets of cardiac markers (cardiac markers are currently negative).  Will order a 2-D echocardiogram to re-evaluate his left ventricular systolic and diastolic function.  If he rules in for MI with cardiac markers, will consider re-evaluation of his coronaries.  Jaya Lapka E 01/29/2011, 11:28 PM

## 2011-01-30 ENCOUNTER — Other Ambulatory Visit: Payer: Self-pay

## 2011-01-30 ENCOUNTER — Encounter (HOSPITAL_COMMUNITY): Payer: Self-pay | Admitting: Internal Medicine

## 2011-01-30 DIAGNOSIS — I5023 Acute on chronic systolic (congestive) heart failure: Principal | ICD-10-CM

## 2011-01-30 DIAGNOSIS — I4891 Unspecified atrial fibrillation: Secondary | ICD-10-CM

## 2011-01-30 LAB — CBC
Hemoglobin: 11.7 g/dL — ABNORMAL LOW (ref 13.0–17.0)
MCH: 31.6 pg (ref 26.0–34.0)
MCHC: 33.7 g/dL (ref 30.0–36.0)
Platelets: 186 10*3/uL (ref 150–400)

## 2011-01-30 LAB — CARDIAC PANEL(CRET KIN+CKTOT+MB+TROPI)
Relative Index: INVALID (ref 0.0–2.5)
Troponin I: <0.3 ng/mL (ref <0.30)

## 2011-01-30 LAB — CREATININE, SERUM: Creatinine, Ser: 0.95 mg/dL (ref 0.50–1.35)

## 2011-01-30 LAB — URINALYSIS, ROUTINE W REFLEX MICROSCOPIC
Glucose, UA: NEGATIVE mg/dL
Hgb urine dipstick: NEGATIVE
Ketones, ur: NEGATIVE mg/dL
Leukocytes, UA: NEGATIVE
pH: 5 (ref 5.0–8.0)

## 2011-01-30 MED ORDER — PANTOPRAZOLE SODIUM 40 MG PO TBEC
40.0000 mg | DELAYED_RELEASE_TABLET | Freq: Every day | ORAL | Status: DC
  Administered 2011-01-31 – 2011-02-04 (×5): 40 mg via ORAL
  Filled 2011-01-30 (×5): qty 1

## 2011-01-30 MED ORDER — ROSUVASTATIN CALCIUM 20 MG PO TABS
20.0000 mg | ORAL_TABLET | Freq: Every day | ORAL | Status: DC
  Administered 2011-01-31 – 2011-02-05 (×6): 20 mg via ORAL
  Filled 2011-01-30 (×6): qty 1

## 2011-01-30 MED ORDER — OMEPRAZOLE MAGNESIUM 20 MG PO TBEC: 20.0000 mg | DELAYED_RELEASE_TABLET | Freq: Every day | ORAL | Status: DC

## 2011-01-30 MED ORDER — PHENOBARBITAL 30 MG PO TABS: 30.0000 mg | ORAL_TABLET | Freq: Every day | ORAL | Status: DC

## 2011-01-30 MED ORDER — LISINOPRIL 2.5 MG PO TABS
2.5000 mg | ORAL_TABLET | ORAL | Status: AC
  Administered 2011-01-30: 2.5 mg via ORAL
  Filled 2011-01-30: qty 1

## 2011-01-30 MED ORDER — SODIUM CHLORIDE 0.9 % IJ SOLN: 3.0000 mL | INTRAMUSCULAR | Status: DC | PRN

## 2011-01-30 MED ORDER — OMEPRAZOLE MAGNESIUM 20 MG PO TBEC: 20.0000 mg | DELAYED_RELEASE_TABLET | ORAL | Status: DC

## 2011-01-30 MED ORDER — PHENYTOIN SODIUM EXTENDED 100 MG PO CAPS
100.0000 mg | ORAL_CAPSULE | Freq: Three times a day (TID) | ORAL | Status: DC
  Administered 2011-01-30: 100 mg via ORAL
  Filled 2011-01-30: qty 1
  Filled 2011-01-30: qty 2

## 2011-01-30 MED ORDER — PANTOPRAZOLE SODIUM 40 MG PO TBEC
40.0000 mg | DELAYED_RELEASE_TABLET | ORAL | Status: AC
  Administered 2011-01-30: 40 mg via ORAL
  Filled 2011-01-30: qty 1

## 2011-01-30 MED ORDER — NITROGLYCERIN 0.4 MG SL SUBL
0.4000 mg | SUBLINGUAL_TABLET | SUBLINGUAL | Status: DC | PRN
  Administered 2011-01-31 – 2011-02-01 (×5): 0.4 mg via SUBLINGUAL
  Filled 2011-01-30 (×4): qty 25

## 2011-01-30 MED ORDER — HEPARIN SODIUM (PORCINE) 5000 UNIT/ML IJ SOLN
5000.0000 [IU] | Freq: Three times a day (TID) | INTRAMUSCULAR | Status: DC
  Administered 2011-01-31 – 2011-02-01 (×4): 5000 [IU] via SUBCUTANEOUS
  Filled 2011-01-30 (×7): qty 1

## 2011-01-30 MED ORDER — PHENOBARBITAL 32.4 MG PO TABS
48.6000 mg | ORAL_TABLET | Freq: Every day | ORAL | Status: DC
  Administered 2011-02-01: 48.6 mg via ORAL
  Filled 2011-01-30: qty 2

## 2011-01-30 MED ORDER — PHENOBARBITAL 97.2 MG PO TABS: 48.6000 mg | ORAL_TABLET | Freq: Two times a day (BID) | ORAL | Status: DC

## 2011-01-30 MED ORDER — LISINOPRIL 2.5 MG PO TABS
2.5000 mg | ORAL_TABLET | Freq: Every day | ORAL | Status: DC
  Filled 2011-01-30: qty 1

## 2011-01-30 MED ORDER — CLOPIDOGREL BISULFATE 75 MG PO TABS
75.0000 mg | ORAL_TABLET | ORAL | Status: DC
  Administered 2011-01-31 – 2011-02-05 (×6): 75 mg via ORAL
  Filled 2011-01-30 (×10): qty 1

## 2011-01-30 MED ORDER — PHENYTOIN SODIUM EXTENDED 100 MG PO CAPS
100.0000 mg | ORAL_CAPSULE | Freq: Two times a day (BID) | ORAL | Status: DC
  Administered 2011-01-31 – 2011-02-05 (×11): 100 mg via ORAL
  Filled 2011-01-30 (×14): qty 1

## 2011-01-30 MED ORDER — SODIUM CHLORIDE 0.9 % IJ SOLN
3.0000 mL | Freq: Two times a day (BID) | INTRAMUSCULAR | Status: DC
  Administered 2011-01-30: 3 mL via INTRAVENOUS

## 2011-01-30 MED ORDER — PHENOBARBITAL 97.2 MG PO TABS: 48.6000 mg | ORAL_TABLET | Freq: Every day | ORAL | Status: DC

## 2011-01-30 MED ORDER — ONDANSETRON HCL 4 MG/2ML IJ SOLN
4.0000 mg | Freq: Four times a day (QID) | INTRAMUSCULAR | Status: DC | PRN
  Administered 2011-02-01: 4 mg via INTRAVENOUS
  Filled 2011-01-30 (×2): qty 2

## 2011-01-30 MED ORDER — FUROSEMIDE 10 MG/ML IJ SOLN
40.0000 mg | Freq: Two times a day (BID) | INTRAMUSCULAR | Status: DC
  Administered 2011-01-30 (×2): 40 mg via INTRAVENOUS
  Filled 2011-01-30 (×5): qty 4

## 2011-01-30 MED ORDER — ROSUVASTATIN CALCIUM 20 MG PO TABS
20.0000 mg | ORAL_TABLET | ORAL | Status: AC
  Administered 2011-01-30: 20 mg via ORAL
  Filled 2011-01-30: qty 1

## 2011-01-30 MED ORDER — DIGOXIN 125 MCG PO TABS
0.1250 mg | ORAL_TABLET | Freq: Every day | ORAL | Status: DC
  Administered 2011-01-31 – 2011-02-05 (×5): 0.125 mg via ORAL
  Filled 2011-01-30 (×7): qty 1

## 2011-01-30 MED ORDER — PHENOBARBITAL 97.2 MG PO TABS: 97.2000 mg | ORAL_TABLET | Freq: Every day | ORAL | Status: DC

## 2011-01-30 MED ORDER — CARVEDILOL 12.5 MG PO TABS
12.5000 mg | ORAL_TABLET | Freq: Every day | ORAL | Status: DC
  Filled 2011-01-30: qty 1

## 2011-01-30 MED ORDER — POTASSIUM CHLORIDE CRYS ER 10 MEQ PO TBCR
10.0000 meq | EXTENDED_RELEASE_TABLET | Freq: Two times a day (BID) | ORAL | Status: DC
  Administered 2011-01-30 – 2011-02-05 (×13): 10 meq via ORAL
  Filled 2011-01-30 (×15): qty 1

## 2011-01-30 MED ORDER — DIGOXIN 125 MCG PO TABS: 250.0000 ug | ORAL_TABLET | Freq: Every day | ORAL | Status: DC

## 2011-01-30 MED ORDER — SODIUM CHLORIDE 0.9 % IV SOLN: 250.0000 mL | INTRAVENOUS | Status: DC | PRN

## 2011-01-30 MED ORDER — PHENOBARBITAL 32.4 MG PO TABS
97.2000 mg | ORAL_TABLET | Freq: Two times a day (BID) | ORAL | Status: DC
  Administered 2011-01-30 – 2011-01-31 (×2): 97.2 mg via ORAL
  Filled 2011-01-30 (×2): qty 3

## 2011-01-30 MED ORDER — ACETAMINOPHEN 325 MG PO TABS
650.0000 mg | ORAL_TABLET | ORAL | Status: DC | PRN
  Administered 2011-01-30 – 2011-02-05 (×8): 650 mg via ORAL
  Filled 2011-01-30 (×9): qty 2

## 2011-01-30 MED ORDER — CARVEDILOL 12.5 MG PO TABS
12.5000 mg | ORAL_TABLET | ORAL | Status: AC
  Administered 2011-01-30: 12.5 mg via ORAL
  Filled 2011-01-30: qty 1

## 2011-01-30 MED ORDER — PHENYTOIN SODIUM EXTENDED 100 MG PO CAPS
200.0000 mg | ORAL_CAPSULE | Freq: Every day | ORAL | Status: DC
  Administered 2011-01-30 – 2011-02-04 (×6): 200 mg via ORAL
  Filled 2011-01-30 (×8): qty 2

## 2011-01-30 NOTE — Plan of Care (Signed)
Problem: Consults Goal: Respiratory Problems Patient Education See Patient Education Module for education specifics. Outcome: Progressing CHF excerbation  Problem: ICU Phase Progression Outcomes Goal: O2 sats trending toward baseline Outcome: Progressing Pt uses 2l at home is now on 3L Goal: Flu/PneumoVaccines if indicated Outcome: Completed/Met Date Met:  01/30/11 Pt has previously received both vaccinations Goal: Voiding-avoid urinary catheter unless indicated Outcome: Not Progressing Foley for strict IO

## 2011-01-30 NOTE — Progress Notes (Signed)
Patient ID: [name], male   DOB: 05-22-1933, 78 y.o.   MRN: [id number] Asked if I could downgrade patient to Tele or stepdown from ICU because of bed situation. Patient is a 78 yr old man with AF and severe heart failure from ischemic cardiomyopathy s/p CABG,PCI,ICD implantation. He was admitted with with SOB and chest xray revealed pulmonary edema for which he was treated with BIPAP and furosemide. Of note he also has COPD and on home O2 at 2L/min. Patient states he feels better and hopes to be discharged tomorrow morning. He is off BIPAP and on 3L/min of O2 with saturation at 98-99%. Is does not appear to be in any distress, lying almost flat in bed. BP 105/70, HR 70b/m paced, RR 18c/m. He has JVP up to the angle of the jaw, II/VI systolic murmur over the apex, Bibasilar creps, no wheezing, no leg edema. My assessment is that he may be downgraded to step down unit or telemetry. I will place an order for same.

## 2011-01-30 NOTE — Progress Notes (Signed)
Patient Name: [name]      SUBJECTIVE: He was admitted last night with congestive heart failure is much improved following BiPAP and diuretics. He is on chronic oxygen.  He is markedly limited at baseline. He has nocturnal dyspnea orthopnea and less than 100 yards of exercise capacity.    Past Medical History  Diagnosis Date  . Tobacco use disorder   . Esophageal reflux   . Unspecified cerebral artery occlusion with cerebral infarction   . Other and unspecified hyperlipidemia   . Dual implantable cardiac defibrillator in situ     [employer]  . ischemic cardiomyopathy     MI in 1989 with subsequent CABG, bare-metal stent to circumflex 2012, ejection fraction 10-15%  . Coronary artery disease   . Chronic systolic heart failure   . Sinoatrial node dysfunction   . Campath-induced atrial fibrillation     Paroxysmal  . Stroke   . Angina   . COPD (chronic obstructive pulmonary disease)   . Anemia     PHYSICAL EXAM Filed Vitals:   01/30/11 1043 01/30/11 1045 01/30/11 1330 01/30/11 1445  BP: 87/57 97/59 101/63 102/51  Pulse:   70   Resp: 20  18 22   SpO2: 97%  99% 100%    Well developed and nourished in no acute distress HENT normal Neck supple with JVP->10Carotids brisk and full without bruits Diffuse crackles Regular rate and rhythm, 2-3/6 systolic murmur at the apex which is dyskinetic and displaced Abd-soft with active BS with liver margin 2 cm below the costal margin No Clubbing cyanosis trace in Skin-warm and dry A & Oriented  Grossly normal sensory and motor function   TELEMETRY: Reviewed telemetry pt in ventricularly paced to  No intake or output data in the 24 hours ending 01/30/11 1558  LABS: Basic Metabolic Panel:  Lab 01/30/11 0960 01/29/11 2156  NA -- 139  K -- 4.3  CL -- 98  CO2 -- 25  GLUCOSE -- 229*  BUN -- 25*  CREATININE 0.95 1.12  CALCIUM -- 8.9  MG -- --  PHOS -- --   Cardiac Enzymes: No results found for this basename:  CKTOTAL:3,CKMB:3,CKMBINDEX:3,TROPONINI:3 in the last 72 hours CBC:  Lab 01/30/11 1002 01/29/11 2156  WBC 7.9 11.4*  NEUTROABS -- 7.0  HGB 11.7* 13.4  HCT 34.7* 39.1  MCV 93.8 96.1  PLT 186 231   PROTIME:  Basename 01/29/11 2156  LABPROT 27.4*  INR 2.50*    ASSESSMENT AND PLAN: Patient has coronary disease prior CABG treated most recently with stenting of his circumflex in November has problems with chronic shortness of breath. He has atrial fibrillation of unknown burden and is a previously implanted device with which he is AV pacing chronically. He has marked decreased left ventricular function with an ejection fraction of 10-15%.  He underwent bare-metal stenting in November. We will plan to discontinue his Plavix assuming his cardiac enzymes are normal.  He is in atrial fibrillation. He was in sinus rhythm and seen in November. This might be contributing to his decompensation and consideration should be given to antiarrhythmic therapy. Perhaps we can some of  this information about his cardiac Compass with device interrogation.  I would also ask his primary physicians as to whether he would be a candidate for CRT upgrade perhaps with a pacemaker given his class IV status.  Today will continue his IV diuretics. Anticipate device interrogation and and further therapies based on that. We will check troponin   Patient Active Hospital Problem  List: Acute on chronic systolic heart failure (04/15/2008)   CARDIOMYOPATHY, ISCHEMIC (04/15/2008)  * Atrial fibrillation (12/14/2010)  * AUTOMATIC IMPLANTABLE CARDIAC DEFIBRILLATOR SITU (03/31/2009)      Signed, Sherryl Manges MD  01/30/2011

## 2011-01-30 NOTE — ED Notes (Signed)
Pt complaining that bipap is extremely short of breath.

## 2011-01-30 NOTE — ED Notes (Signed)
Cardiology paged to see about possibly downgrading pt acuity to stepdown or tele.  MD states he is going to come eye ball pt and make a decision.

## 2011-01-30 NOTE — ED Notes (Signed)
Patient reports onset 10/10 chest pain patient is observed to have labored breathing lung sounds are diminished. Patient placed on bipap by RT spouse at bedside

## 2011-01-30 NOTE — ED Notes (Signed)
bipap dc, pt states slight aching where pacemaker is.

## 2011-01-30 NOTE — ED Notes (Signed)
Flow manager called to see about status of bed request, states no icu beds in immediate future.

## 2011-01-30 NOTE — ED Notes (Signed)
Pt complaining that bi-pap was extremely uncomfortable and he feels as if his breathing has improved. Nursing removed bipap and his oxygen has remained 98% on 4lnc which is his normal usage at home. Nursing to continue to monitor.

## 2011-01-31 ENCOUNTER — Other Ambulatory Visit: Payer: Self-pay

## 2011-01-31 DIAGNOSIS — I059 Rheumatic mitral valve disease, unspecified: Secondary | ICD-10-CM

## 2011-01-31 LAB — BASIC METABOLIC PANEL
BUN: 37 mg/dL — ABNORMAL HIGH (ref 6–23)
GFR calc non Af Amer: 70 mL/min — ABNORMAL LOW (ref >90)
Glucose, Bld: 108 mg/dL — ABNORMAL HIGH (ref 70–99)
Potassium: 3.8 mEq/L (ref 3.5–5.1)

## 2011-01-31 LAB — CARDIAC PANEL(CRET KIN+CKTOT+MB+TROPI)
CK, MB: 2.7 ng/mL (ref 0.3–4.0)
Total CK: 40 U/L (ref 7–232)
Troponin I: <0.3 ng/mL (ref <0.30)
Troponin I: <0.3 ng/mL (ref <0.30)

## 2011-01-31 LAB — URINE CULTURE
Colony Count: NO GROWTH
Culture  Setup Time: 201301051133
Culture: NO GROWTH

## 2011-01-31 LAB — PROTIME-INR: INR: 2.18 — ABNORMAL HIGH (ref 0.00–1.49)

## 2011-01-31 MED ORDER — WARFARIN SODIUM 10 MG PO TABS
12.5000 mg | ORAL_TABLET | Freq: Once | ORAL | Status: AC
  Administered 2011-01-31: 12.5 mg via ORAL
  Filled 2011-01-31: qty 1

## 2011-01-31 MED ORDER — SODIUM CHLORIDE 0.9 % IJ SOLN: 3.0000 mL | INTRAMUSCULAR | Status: DC | PRN

## 2011-01-31 MED ORDER — PHENOBARBITAL 32.4 MG PO TABS
97.2000 mg | ORAL_TABLET | Freq: Every day | ORAL | Status: DC
  Administered 2011-01-31 – 2011-02-04 (×5): 97.2 mg via ORAL
  Filled 2011-01-31 (×3): qty 3
  Filled 2011-01-31 (×2): qty 1
  Filled 2011-01-31: qty 3

## 2011-01-31 MED ORDER — HYDROCORTISONE 1 % EX CREA
1.0000 "application " | TOPICAL_CREAM | Freq: Three times a day (TID) | CUTANEOUS | Status: DC | PRN
  Filled 2011-01-31: qty 28

## 2011-01-31 MED ORDER — SODIUM CHLORIDE 0.9 % IJ SOLN
3.0000 mL | Freq: Two times a day (BID) | INTRAMUSCULAR | Status: DC
  Administered 2011-01-31 – 2011-02-04 (×7): 3 mL via INTRAVENOUS

## 2011-01-31 MED ORDER — NITROGLYCERIN IN D5W 200-5 MCG/ML-% IV SOLN
5.0000 ug/min | INTRAVENOUS | Status: DC
  Administered 2011-01-31: 5 ug/min via INTRAVENOUS

## 2011-01-31 MED ORDER — NITROGLYCERIN IN D5W 200-5 MCG/ML-% IV SOLN
INTRAVENOUS | Status: AC
  Administered 2011-01-31: 5 ug/min via INTRAVENOUS
  Filled 2011-01-31: qty 250

## 2011-01-31 MED ORDER — FUROSEMIDE 10 MG/ML IJ SOLN
20.0000 mg | Freq: Once | INTRAMUSCULAR | Status: AC
  Administered 2011-01-31: 20 mg via INTRAVENOUS
  Filled 2011-01-31: qty 2

## 2011-01-31 MED ORDER — SODIUM CHLORIDE 0.9 % IV SOLN: 250.0000 mL | INTRAVENOUS | Status: DC

## 2011-01-31 NOTE — Progress Notes (Signed)
Patient Name: [name]      SUBJECTIVE: He was admitted fri night with congestive heart failure is much improved following BiPAP and diuretics. He is on chronic oxygen.  He is markedly limited at baseline. He has nocturnal dyspnea orthopnea and less than 100 yards of exercise capacity.    Past Medical History  Diagnosis Date  . Tobacco use disorder   . Esophageal reflux   . Unspecified cerebral artery occlusion with cerebral infarction   . Other and unspecified hyperlipidemia   . Dual implantable cardiac defibrillator in situ     Medtronic  . ischemic cardiomyopathy     MI in 1989 with subsequent CABG, bare-metal stent to circumflex 2012, ejection fraction 10-15%  . Coronary artery disease   . Chronic systolic heart failure   . Sinoatrial node dysfunction   . Campath-induced atrial fibrillation     Paroxysmal  . Stroke   . Angina   . COPD (chronic obstructive pulmonary disease)   . Anemia   . Heart murmur   . Shortness of breath   . Seizures   . CHF (congestive heart failure)   . Anxiety   . Myocardial infarction   . Pneumonia     PHYSICAL EXAM Filed Vitals:   01/31/11 0800 01/31/11 0830 01/31/11 0902 01/31/11 1145  BP: 81/41 90/61  98/65  Pulse: 70 69 70 70  Temp: 97.9 F (36.6 C)   97.6 F (36.4 C)  TempSrc: Oral   Oral  Resp: 15 15  13   Height:      Weight:      SpO2: 98% 96%  97%    Well developed and nourished in no acute distress HENT normal Neck supple with JVP->10Carotids brisk and full without bruits Diffuse crackles Regular rate and rhythm, 2-3/6 systolic murmur at the apex which is dyskinetic and displaced Abd-soft with active BS with liver margin 2 cm below the costal margin No Clubbing cyanosis trace in Skin-warm and dry A & Oriented  Grossly normal sensory and motor function   TELEMETRY: Reviewed telemetry pt in ventricularly paced with AF Intake/Output Summary (Last 24 hours) at 01/31/11 1408 Last data filed at 01/31/11 1146  Gross per 24 hour  Intake    483 ml  Output   2575 ml  Net  -2092 ml    LABS: Basic Metabolic Panel:  Lab 01/31/11 0981 01/30/11 1002 01/29/11 2156  NA 137 -- 139  K 3.8 -- 4.3  CL 99 -- 98  CO2 30 -- 25  GLUCOSE 108* -- 229*  BUN 37* -- 25*  CREATININE 1.02 0.95 1.12  CALCIUM 7.9* -- 8.9  MG -- -- --  PHOS -- -- --   Cardiac Enzymes:  Basename 01/31/11 0902 01/31/11 0006 01/30/11 1526  CKTOTAL 40 40 46  CKMB 2.6 2.7 3.0  CKMBINDEX -- -- --  TROPONINI <0.30 <0.30 <0.30   CBC:  Lab 01/30/11 1002 01/29/11 2156  WBC 7.9 11.4*  NEUTROABS -- 7.0  HGB 11.7* 13.4  HCT 34.7* 39.1  MCV 93.8 96.1  PLT 186 231   PROTIME:  Basename 01/29/11 2156  LABPROT 27.4*  INR 2.50*    ASSESSMENT AND PLAN: Patient has coronary disease prior CABG treated most recently with stenting of his circumflex in November has problems with chronic shortness of breath. He has atrial fibrillation of unknown burden and is a previously implanted device with which he is AV pacing chronically. He has marked decreased left ventricular function with an ejection fraction of  10-15%.  He underwent bare-metal stenting in November.  cardiac enzymes are normal.and  will discontinue his Plavix   He is in atrial fibrillation. He was in sinus rhythm and seen in November. This might be contributing to his decompensation and consideration should be given to antiarrhythmic therapy. Perhaps we can some of  this information about his cardiac Compass with device interrogation.  I would also ask his primary physicians as to whether he would be a candidate for CRT upgrade perhaps with a pacemaker given his class IV status.  Ruled out for MI  BP low so meds held this am , per pt  His BP runs in the 80-90s so will resume although lower doses.  He might tolerate aldactone   Will  schedule DCCV as has been in AFsince Nov  Consider amiodarone per Dr Leonia Reeves Coamdin inadvertently held, will check INR now and in am and begin  heparin IV if necessary  Device interrogation shows AF persistent since NOV o/w ok  Patient Active Hospital Problem List: Acute on chronic systolic heart failure (04/15/2008)   CARDIOMYOPATHY, ISCHEMIC (04/15/2008)  * Atrial fibrillation (12/14/2010)  * AUTOMATIC IMPLANTABLE CARDIAC DEFIBRILLATOR SITU (03/31/2009)      Signed, Sherryl Manges MD  01/31/2011

## 2011-01-31 NOTE — Progress Notes (Signed)
ANTICOAGULATION CONSULT NOTE - Initial Consult  Pharmacy Consult for warfarin Indication: atrial fibrillation  Allergies  Allergen Reactions  . Valsartan     Patient Measurements: Height: 5\' 7"  (170.2 cm) Weight: 148 lb 13 oz (67.5 kg) IBW/kg (Calculated) : 66.1    Vital Signs: Temp: 97.6 F (36.4 C) (01/06 1145) Temp src: Oral (01/06 1145) BP: 98/65 mmHg (01/06 1145) Pulse Rate: 70  (01/06 1145)  Labs:  Basename 01/31/11 0902 01/31/11 0007 01/31/11 0006 01/30/11 1526 01/30/11 1002 01/29/11 2156  HGB -- -- -- -- 11.7* 13.4  HCT -- -- -- -- 34.7* 39.1  PLT -- -- -- -- 186 231  APTT -- -- -- -- -- --  LABPROT -- -- -- -- -- 27.4*  INR -- -- -- -- -- 2.50*  HEPARINUNFRC -- -- -- -- -- --  CREATININE -- 1.02 -- -- 0.95 1.12  CKTOTAL 40 -- 40 46 -- --  CKMB 2.6 -- 2.7 3.0 -- --  TROPONINI <0.30 -- <0.30 <0.30 -- --   Estimated Creatinine Clearance: 58.5 ml/min (by C-G formula based on Cr of 1.02).  Medical History: Past Medical History  Diagnosis Date  . Tobacco use disorder   . Esophageal reflux   . Unspecified cerebral artery occlusion with cerebral infarction   . Other and unspecified hyperlipidemia   . Dual implantable cardiac defibrillator in situ     [employer]tronic  . ischemic cardiomyopathy     MI in 1989 with subsequent CABG, bare-metal stent to circumflex 2012, ejection fraction 10-15%  . Coronary artery disease   . Chronic systolic heart failure   . Sinoatrial node dysfunction   . Campath-induced atrial fibrillation     Paroxysmal  . Stroke   . Angina   . COPD (chronic obstructive pulmonary disease)   . Anemia   . Heart murmur   . Shortness of breath   . Seizures   . CHF (congestive heart failure)   . Anxiety   . Myocardial infarction   . Pneumonia     Medications:  Prescriptions prior to admission  Medication Sig Dispense Refill  . carvedilol (COREG) 12.5 MG tablet Take 12.5 mg by mouth daily.        . clopidogrel (PLAVIX) 75 MG tablet Take 75  mg by mouth every morning.        . digoxin (LANOXIN) 0.25 MG tablet Take 250 mcg by mouth daily.        Marland Kitchen FeFum-FePoly-FA-B Cmp-C-Biot (FOLIVANE-PLUS) CAPS Take 1 capsule by mouth daily.        . furosemide (LASIX) 40 MG tablet Take 40 mg by mouth 2 (two) times daily.       Marland Kitchen lisinopril (PRINIVIL,ZESTRIL) 2.5 MG tablet Take 2.5 mg by mouth daily.        . nitroGLYCERIN (NITROSTAT) 0.4 MG SL tablet Place 1 tablet (0.4 mg total) under the tongue every 5 (five) minutes as needed for chest pain.  25 tablet  3  . omeprazole (PRILOSEC OTC) 20 MG tablet Take 20 mg by mouth daily.        Marland Kitchen PHENobarbital (LUMINAL) 97.2 MG tablet Take 48.6-97.2 mg by mouth 2 (two) times daily. Patient takes 1/2 tablet in the morning and 1 tablet in the evening      . phenytoin (DILANTIN) 100 MG ER capsule Take 100-200 mg by mouth 3 (three) times daily. Patient takes 1 tablet in the morning, 1 tablet at noon and 2 tablets in the evening      .  potassium chloride (KLOR-CON) 10 MEQ CR tablet Take 10 mEq by mouth daily.       . simvastatin (ZOCOR) 80 MG tablet Take 80 mg by mouth every other day.       . warfarin (COUMADIN) 7.5 MG tablet Take 7.5 mg by mouth daily. Patient currently taking 7.5 mg daily         Assessment: 78 yo M on chronic warfarin at home for afib: per Medication History, takes 7.5 mg po qday.  Goal of Therapy:  INR 2-3   Plan:  1.  INR currently pending, will f/u and dose warfarin appropriately- INR reported at 1645 as 2.18. 2. Since pt missed warfarin dose yesterday, will give increased dose of 12.5 mg today 3. Daily PT/INR  Jill Side L. Illene Bolus, PharmD, BCPS Clinical Pharmacist Pager: [phone] 01/31/2011 2:53 PM

## 2011-01-31 NOTE — Progress Notes (Signed)
  Echocardiogram 2D Echocardiogram has been performed.  Cathie Beams Deneen 01/31/2011, 8:16 AM

## 2011-01-31 NOTE — Progress Notes (Signed)
PTs BP 75-83 systolic, pt asymptomatic, manually checked was 82-83 systolic. Dr Fausto Skillern called and notified said to return call if pt became  symptomatic.

## 2011-01-31 NOTE — Progress Notes (Signed)
Pt c/o midsternal chest pain and SOB, o2 sats 97% on 2L.  Placed on 100% NRB for patient's comfort.  BP 100s/60s Dr. Antoine Poche notified of above.  Orders received for lasix. Lasix given.  Also, nitro SL given x2 doses with relief of chest pain.  Patient returned back to previous O2 @ 2L.  Will continue to monitor.   Roselie Awkward, RN

## 2011-01-31 NOTE — Progress Notes (Signed)
Pt's SBP 70s-90s overnight.  Currently 90/61.  Pt asymptomatic.  Notified Gene Serpe, PA about BP.  Orders received to discontinue lasix, coreg and lisinopril for now.  Will continue to follow.   Roselie Awkward, RN

## 2011-01-31 NOTE — Progress Notes (Signed)
Pt c/o return of chest pain, now 9/10.  BP 113/65 HR afib/vpaced 70s.  Patient returned to 100% NRB.  One SL nitro given.  Approximately 300cc of urine output following lasix.  Dr. Antoine Poche notified of above.  Orders received for nitro drip.    Roselie Awkward, RN

## 2011-02-01 ENCOUNTER — Encounter (HOSPITAL_COMMUNITY)
Admission: EM | Disposition: A | Discharge status: Home / Self Care | Payer: Self-pay | Source: Home / Self Care | Attending: Internal Medicine

## 2011-02-01 DIAGNOSIS — I4891 Unspecified atrial fibrillation: Secondary | ICD-10-CM

## 2011-02-01 HISTORY — PX: LEFT AND RIGHT HEART CATHETERIZATION WITH CORONARY/GRAFT ANGIOGRAM: SHX5448

## 2011-02-01 LAB — PROTIME-INR: Prothrombin Time: 25.4 seconds — ABNORMAL HIGH (ref 11.6–15.2)

## 2011-02-01 LAB — BASIC METABOLIC PANEL
BUN: 31 mg/dL — ABNORMAL HIGH (ref 6–23)
CO2: 30 mEq/L (ref 19–32)
Calcium: 8.5 mg/dL (ref 8.4–10.5)
Creatinine, Ser: 0.83 mg/dL (ref 0.50–1.35)

## 2011-02-01 LAB — CARDIAC PANEL(CRET KIN+CKTOT+MB+TROPI)
CK, MB: 2.4 ng/mL (ref 0.3–4.0)
Troponin I: <0.3 ng/mL (ref <0.30)

## 2011-02-01 SURGERY — LEFT AND RIGHT HEART CATHETERIZATION WITH CORONARY/GRAFT ANGIOGRAM

## 2011-02-01 MED ORDER — MORPHINE SULFATE 10 MG/ML IJ SOLN
INTRAMUSCULAR | Status: AC
  Filled 2011-02-01: qty 1

## 2011-02-01 MED ORDER — SODIUM CHLORIDE 0.9 % IJ SOLN
3.0000 mL | Freq: Two times a day (BID) | INTRAMUSCULAR | Status: DC
  Administered 2011-02-02 – 2011-02-03 (×2): 3 mL via INTRAVENOUS

## 2011-02-01 MED ORDER — DIGOXIN 0.25 MG/ML IJ SOLN
0.1250 mg | INTRAMUSCULAR | Status: DC
  Filled 2011-02-01: qty 0.5

## 2011-02-01 MED ORDER — FUROSEMIDE 10 MG/ML IJ SOLN
80.0000 mg | Freq: Once | INTRAMUSCULAR | Status: AC
  Administered 2011-02-01: 80 mg via INTRAVENOUS
  Filled 2011-02-01: qty 8

## 2011-02-01 MED ORDER — ACETAMINOPHEN 325 MG PO TABS: 650.0000 mg | ORAL_TABLET | ORAL | Status: DC | PRN

## 2011-02-01 MED ORDER — MORPHINE SULFATE 2 MG/ML IJ SOLN
2.0000 mg | INTRAMUSCULAR | Status: DC | PRN
  Administered 2011-02-02: 2 mg via INTRAVENOUS
  Filled 2011-02-01: qty 1

## 2011-02-01 MED ORDER — LIDOCAINE HCL (PF) 1 % IJ SOLN
INTRAMUSCULAR | Status: AC
  Filled 2011-02-01: qty 30

## 2011-02-01 MED ORDER — HEPARIN (PORCINE) IN NACL 2-0.9 UNIT/ML-% IJ SOLN
INTRAMUSCULAR | Status: AC
  Filled 2011-02-01: qty 2000

## 2011-02-01 MED ORDER — METOPROLOL TARTRATE 1 MG/ML IV SOLN
INTRAVENOUS | Status: AC
  Filled 2011-02-01: qty 5

## 2011-02-01 MED ORDER — NITROGLYCERIN IN D5W 200-5 MCG/ML-% IV SOLN: 40.0000 ug/min | INTRAVENOUS | Status: DC

## 2011-02-01 MED ORDER — METOPROLOL TARTRATE 1 MG/ML IV SOLN: 5.0000 mg | Freq: Once | INTRAVENOUS | Status: DC

## 2011-02-01 MED ORDER — SODIUM CHLORIDE 0.9 % IV SOLN: 250.0000 mL | INTRAVENOUS | Status: DC | PRN

## 2011-02-01 MED ORDER — FUROSEMIDE 10 MG/ML IJ SOLN
40.0000 mg | Freq: Two times a day (BID) | INTRAMUSCULAR | Status: DC
  Administered 2011-02-01: 40 mg via INTRAVENOUS
  Filled 2011-02-01 (×2): qty 4

## 2011-02-01 MED ORDER — SODIUM CHLORIDE 0.9 % IJ SOLN
3.0000 mL | INTRAMUSCULAR | Status: DC | PRN
  Administered 2011-02-02: 3 mL via INTRAVENOUS

## 2011-02-01 MED ORDER — WHITE PETROLATUM GEL
Status: AC
  Administered 2011-02-01: 11:00:00
  Filled 2011-02-01: qty 5

## 2011-02-01 MED ORDER — NITROGLYCERIN 0.2 MG/ML ON CALL CATH LAB
INTRAVENOUS | Status: AC
  Filled 2011-02-01: qty 1

## 2011-02-01 MED ORDER — FUROSEMIDE 10 MG/ML IJ SOLN
40.0000 mg | Freq: Once | INTRAMUSCULAR | Status: DC
  Filled 2011-02-01: qty 4

## 2011-02-01 MED ORDER — MORPHINE SULFATE 2 MG/ML IJ SOLN: 2.0000 mg | Freq: Once | INTRAMUSCULAR | Status: DC

## 2011-02-01 MED ORDER — ONDANSETRON HCL 4 MG/2ML IJ SOLN: 4.0000 mg | Freq: Four times a day (QID) | INTRAMUSCULAR | Status: DC | PRN

## 2011-02-01 MED ORDER — FUROSEMIDE 10 MG/ML IJ SOLN
INTRAMUSCULAR | Status: AC
  Filled 2011-02-01: qty 8

## 2011-02-01 MED ORDER — MORPHINE SULFATE 2 MG/ML IJ SOLN
INTRAMUSCULAR | Status: AC
  Administered 2011-02-02: 2 mg via INTRAVENOUS
  Filled 2011-02-01: qty 1

## 2011-02-01 MED ORDER — WARFARIN SODIUM 7.5 MG PO TABS
7.5000 mg | ORAL_TABLET | Freq: Once | ORAL | Status: AC
  Administered 2011-02-01: 7.5 mg via ORAL
  Filled 2011-02-01: qty 1

## 2011-02-01 NOTE — Procedures (Signed)
Patient sedated per anesthesia with 50 mg Propofol IV. With pads in the AP position patient was cardioverted with 150J synchronized biphasic energy to [employer] interrogated  Reprogrammed to a SR of 80 bpm. Procedure without complication.

## 2011-02-01 NOTE — Brief Op Note (Signed)
01/29/2011 - 02/01/2011  Emergent R and L heart catherization.     Full not dictated # [id number]; CSN [id number]    RA 24 RV  75/23 PA 75/33 PC not able to wedge . Ao 110-120/88 LV 110/34 post a wave 42  Patent LIMA, patent SVG to om and known occlusion of prior graft to uncertain site (?distal LCX) LAD 50% prox stenosis LCX 50% prox stenosis; patent stent to disatal AV groove LCX. Small NDRCA.  Pt treated with MSO4, 80 mg IV Lasix, and started on BiPAP.  Lennette Bihari, MD, [facility] 02/01/2011 8:56 PM

## 2011-02-01 NOTE — Progress Notes (Signed)
Patient ID: [name], male   DOB: [birth year], 78 y.o.   MRN: [id number] Subjective:  Called to see patient for chest pain and increased ventricular rate. He is sob and tachycardic. He has received 15 mg IV lopressor, IV ntg and IV lasix. He has ventricular pacing ongoing but it appears that he has incremental ST elevation across his anterior precordium. He has a known ICM and severe lv dysfunction.   Objective:  Vital Signs in the last 24 hours: Temp:  [97.5 F (36.4 C)-98 F (36.7 C)] 97.5 F (36.4 C) (01/07 1200) Pulse Rate:  [66-80] 80  (01/07 1600) Resp:  [14-21] 19  (01/07 1600) BP: (80-112)/(45-70) 112/68 mmHg (01/07 1600) SpO2:  [94 %-100 %] 97 % (01/07 1600) FiO2 (%):  [2 %] 2 % (01/07 1600) Weight:  [67.1 kg (147 lb 14.9 oz)] 147 lb 14.9 oz (67.1 kg) (01/07 0500)  Intake/Output from previous day: 01/06 0701 - 01/07 0700 In: 268.4 [P.O.:240; I.V.:28.4] Out: 1601 [Urine:1600; Stool:1] Intake/Output from this shift: Total I/O In: 48 [I.V.:46; IV Piggyback:2] Out: 200 [Urine:200]  Physical Exam: Well appearing NAD HEENT: Unremarkable Neck:  No JVD, no thyromegally Lymphatics:  No adenopathy Back:  No CVA tenderness Lungs:  Clear HEART:  Regular rate rhythm, no murmurs, no rubs, no clicks Abd:  Flat, positive bowel sounds, no organomegally, no rebound, no guarding Ext:  2 plus pulses, no edema, no cyanosis, no clubbing Skin:  No rashes no nodules Neuro:  CN II through XII intact, motor grossly intact  Lab Results:  Basename 01/30/11 1002 01/29/11 2156  WBC 7.9 11.4*  HGB 11.7* 13.4  PLT 186 231    Basename 02/01/11 0505 01/31/11 0007  NA 138 137  K 4.2 3.8  CL 100 99  CO2 30 30  GLUCOSE 105* 108*  BUN 31* 37*  CREATININE 0.83 1.02    Basename 01/31/11 0902 01/31/11 0006  TROPONINI <0.30 <0.30   Hepatic Function Panel No results found for this basename: PROT,ALBUMIN,AST,ALT,ALKPHOS,BILITOT,BILIDIR,IBILI in the last 72 hours No results found for this  basename: CHOL in the last 72 hours No results found for this basename: PROTIME in the last 72 hours  Imaging: No results found.  Cardiac Studies: NSR with RV pacing Assessment/Plan:  1.  Probable acute MI - I cannot get his pain controlled despite meds as above. I will plan to send for emergent catheterization though he is at high risk with multiple comorbidities. I have discussed this with the patient who is willing.   LOS: 3 days    Lewayne Bunting 02/01/2011, 6:40 PM

## 2011-02-01 NOTE — Progress Notes (Signed)
ANTICOAGULATION CONSULT NOTE - Follow Up Consult  Pharmacy Consult for coumadin Indication: atrial fibrillation  Vital Signs: Temp: 97.7 F (36.5 C) (01/07 0818) Temp src: Oral (01/07 0400) BP: 103/67 mmHg (01/07 0818) Pulse Rate: 69  (01/07 0818)  Labs:  Basename 02/01/11 1100 02/01/11 0505 01/31/11 1531 01/31/11 0902 01/31/11 0007 01/31/11 0006 01/30/11 1526 01/30/11 1002 01/29/11 2156  HGB -- -- -- -- -- -- -- 11.7* 13.4  HCT -- -- -- -- -- -- -- 34.7* 39.1  PLT -- -- -- -- -- -- -- 186 231  APTT -- -- -- -- -- -- -- -- --  LABPROT 25.4* -- 24.6* -- -- -- -- -- 27.4*  INR 2.27* -- 2.18* -- -- -- -- -- 2.50*  HEPARINUNFRC -- -- -- -- -- -- -- -- --  CREATININE -- 0.83 -- -- 1.02 -- -- 0.95 --  CKTOTAL -- -- -- 40 -- 40 46 -- --  CKMB -- -- -- 2.6 -- 2.7 3.0 -- --  TROPONINI -- -- -- <0.30 -- <0.30 <0.30 -- --   Estimated Creatinine Clearance: 71.9 ml/min (by C-G formula based on Cr of 0.83).   Medications:  Prescriptions prior to admission  Medication Sig Dispense Refill  . carvedilol (COREG) 12.5 MG tablet Take 12.5 mg by mouth daily.        . clopidogrel (PLAVIX) 75 MG tablet Take 75 mg by mouth every morning.        . digoxin (LANOXIN) 0.25 MG tablet Take 250 mcg by mouth daily.        Marland Kitchen FeFum-FePoly-FA-B Cmp-C-Biot (FOLIVANE-PLUS) CAPS Take 1 capsule by mouth daily.        . furosemide (LASIX) 40 MG tablet Take 40 mg by mouth 2 (two) times daily.       Marland Kitchen lisinopril (PRINIVIL,ZESTRIL) 2.5 MG tablet Take 2.5 mg by mouth daily.        . nitroGLYCERIN (NITROSTAT) 0.4 MG SL tablet Place 1 tablet (0.4 mg total) under the tongue every 5 (five) minutes as needed for chest pain.  25 tablet  3  . omeprazole (PRILOSEC OTC) 20 MG tablet Take 20 mg by mouth daily.        Marland Kitchen PHENobarbital (LUMINAL) 97.2 MG tablet Take 48.6-97.2 mg by mouth 2 (two) times daily. Patient takes 1/2 tablet in the morning and 1 tablet in the evening      . phenytoin (DILANTIN) 100 MG ER capsule Take  100-200 mg by mouth 3 (three) times daily. Patient takes 1 tablet in the morning, 1 tablet at noon and 2 tablets in the evening      . potassium chloride (KLOR-CON) 10 MEQ CR tablet Take 10 mEq by mouth daily.       . simvastatin (ZOCOR) 80 MG tablet Take 80 mg by mouth every other day.       . warfarin (COUMADIN) 7.5 MG tablet Take 7.5 mg by mouth daily. Patient currently taking 7.5 mg daily         Assessment: 77 yom on coumadin PTA for afib. INR is therapeutic at 2.27. No bleeding or other problems noted. Noted that pt was on SQ heparin as well despite therapeutic INR.   Goal of Therapy:  INR 2-3   Plan:  1.Coumadin 7.5mg  PO x 1 tonight 2. Daily PT/INR 3. DC SQ heparin  Meenakshi Sazama, Drake Leach 02/01/2011,12:08 PM

## 2011-02-01 NOTE — Progress Notes (Signed)
Subjective: Patient is still SOB.  No CP. Objective: Filed Vitals:   02/01/11 0400 02/01/11 0500 02/01/11 0818 02/01/11 1200  BP: 106/70  103/67 99/57  Pulse:   69 70  Temp: 98 F (36.7 C)  97.7 F (36.5 C) 97.5 F (36.4 C)  TempSrc: Oral   Oral  Resp:   16 15  Height:      Weight:  147 lb 14.9 oz (67.1 kg)    SpO2:   98% 94%   Weight change: -1 lb 15.7 oz (-0.9 kg)  Intake/Output Summary (Last 24 hours) at 02/01/11 1420 Last data filed at 02/01/11 1100  Gross per 24 hour  Intake  76.38 ml  Output   1476 ml  Net -1399.62 ml    General: Alert, awake, oriented x3, in no acute distress.  HEENT: No bruits, no goiter.  Neck:  inreased JVP. Heart: Irregular rate and rhythm, without murmurs, rubs, gallops.  Lungs: Rales at bases Ext:  No edema.  Lab Results: Results for orders placed during the hospital encounter of 01/29/11 (from the past 24 hour(s))  PROTIME-INR     Status: Abnormal   Collection Time   01/31/11  3:31 PM      Component Value Range   Prothrombin Time 24.6 (*) 11.6 - 15.2 (seconds)   INR 2.18 (*) 0.00 - 1.49   BASIC METABOLIC PANEL     Status: Abnormal   Collection Time   02/01/11  5:05 AM      Component Value Range   Sodium 138  135 - 145 (mEq/L)   Potassium 4.2  3.5 - 5.1 (mEq/L)   Chloride 100  96 - 112 (mEq/L)   CO2 30  19 - 32 (mEq/L)   Glucose, Bld 105 (*) 70 - 99 (mg/dL)   BUN 31 (*) 6 - 23 (mg/dL)   Creatinine, Ser 3.87  0.50 - 1.35 (mg/dL)   Calcium 8.5  8.4 - 56.4 (mg/dL)   GFR calc non Af Amer 84 (*) >90 (mL/min)   GFR calc Af Amer >90  >90 (mL/min)  PROTIME-INR     Status: Abnormal   Collection Time   02/01/11 11:00 AM      Component Value Range   Prothrombin Time 25.4 (*) 11.6 - 15.2 (seconds)   INR 2.27 (*) 0.00 - 1.49     Studies/Results: No results found.  Medications: I have reviewed the patient's current medications.   Patient Active Hospital Problem List: CARDIOMYOPATHY, ISCHEMIC (04/15/2008)   Assessment: Meds have been  held due to BP.  On Dig only   Plan: Cardiovert.  Will attempt lasix after back in sinus rhythm. AUTOMATIC IMPLANTABLE CARDIAC DEFIBRILLATOR SITU (03/31/2009)   Assessment: Device to be interrogated after cardiocverison.   Plan:  Atrial fibrillation (12/14/2010)   Assessment: Will plan to cardiovert given continued severe CHF.  Continue dig.  Cardiovert today.   Plan:   LOS: 3 days   Dietrich Pates 02/01/2011, 2:20 PM

## 2011-02-01 NOTE — Progress Notes (Signed)
Pt HR up to 120s-130s at 1745, BP 130/82.  Ward Givens, NP notified, to come to bedside.  12 lead EKG ordered.  Pt then began to complain of extreme chest pain.  Pt placed on 100% NRB.  Dr. [name] then up to bedside as well.  Pt received total 3 SL nitro, 17mg  IV lopressor, 2mg  iv morphine, 40mg  iv lasix (in addition to scheduled 40 iv lasix) and was increased to iv nitro per MD order.  Pt's HR decreased down to 80s-90s, BP ranged 110s-140s/70s-90s.  Code STEMI called, pt taken to cath lab.  Pt's wife, Tamela Oddi, notified and is coming to hospital.  Rapid Response to bedside.  Pt transferred to cath lab with O2 and Zoll.    Roselie Awkward, RN

## 2011-02-02 ENCOUNTER — Encounter (HOSPITAL_COMMUNITY): Payer: Self-pay

## 2011-02-02 LAB — CARDIAC PANEL(CRET KIN+CKTOT+MB+TROPI)
CK, MB: 2.3 ng/mL (ref 0.3–4.0)
Total CK: 28 U/L (ref 7–232)

## 2011-02-02 LAB — POCT I-STAT 3, ART BLOOD GAS (G3+)
Bicarbonate: 25.6 mEq/L — ABNORMAL HIGH (ref 20.0–24.0)
pH, Arterial: 7.316 — ABNORMAL LOW (ref 7.350–7.450)
pO2, Arterial: 115 mmHg — ABNORMAL HIGH (ref 80.0–100.0)

## 2011-02-02 LAB — POCT ACTIVATED CLOTTING TIME: Activated Clotting Time: 155 seconds

## 2011-02-02 LAB — BASIC METABOLIC PANEL
BUN: 35 mg/dL — ABNORMAL HIGH (ref 6–23)
CO2: 29 mEq/L (ref 19–32)
Chloride: 99 mEq/L (ref 96–112)
Creatinine, Ser: 1.08 mg/dL (ref 0.50–1.35)

## 2011-02-02 MED ORDER — PHENOBARBITAL 32.4 MG PO TABS
48.6000 mg | ORAL_TABLET | Freq: Every day | ORAL | Status: DC
  Administered 2011-02-02 – 2011-02-05 (×4): 48.6 mg via ORAL
  Filled 2011-02-02 (×2): qty 2
  Filled 2011-02-02: qty 1

## 2011-02-02 MED ORDER — METOPROLOL TARTRATE 12.5 MG HALF TABLET
12.5000 mg | ORAL_TABLET | Freq: Two times a day (BID) | ORAL | Status: DC
  Administered 2011-02-02 – 2011-02-05 (×7): 12.5 mg via ORAL
  Filled 2011-02-02 (×8): qty 1

## 2011-02-02 MED ORDER — METOPROLOL TARTRATE 25 MG/10 ML ORAL SUSPENSION: 12.5000 mg | Freq: Two times a day (BID) | ORAL | Status: DC

## 2011-02-02 MED ORDER — FUROSEMIDE 10 MG/ML IJ SOLN
40.0000 mg | Freq: Two times a day (BID) | INTRAMUSCULAR | Status: DC
  Administered 2011-02-02 (×2): 40 mg via INTRAVENOUS
  Filled 2011-02-02 (×5): qty 4

## 2011-02-02 MED ORDER — ISOSORBIDE MONONITRATE ER 30 MG PO TB24
30.0000 mg | ORAL_TABLET | Freq: Every day | ORAL | Status: DC
  Administered 2011-02-02 – 2011-02-04 (×3): 30 mg via ORAL
  Filled 2011-02-02 (×4): qty 1

## 2011-02-02 MED ORDER — BIOTENE DRY MOUTH MT LIQD
15.0000 mL | Freq: Two times a day (BID) | OROMUCOSAL | Status: DC
  Administered 2011-02-02 – 2011-02-03 (×3): 15 mL via OROMUCOSAL

## 2011-02-02 MED ORDER — SODIUM CHLORIDE 0.9 % IV BOLUS (SEPSIS)
250.0000 mL | Freq: Once | INTRAVENOUS | Status: AC
  Administered 2011-02-02: 250 mL via INTRAVENOUS

## 2011-02-02 NOTE — Progress Notes (Signed)
Subjective:    BobbySherman is a 78 y.o. gentleman with a hx of ischemic cardiomyopathy - EF 15-20 %.  CAD /CABG. S/p stenting of his distal LCx in Nov. 2012.  Presented  01/29/11 with recurrent CHF.  He decompensated last night and had acute chest pain.  Emergent cath revealed no changes in his coronaries but significant volume overload with PA pressures of 75 mmHg.  He has diuresed 2.3 liters and feels better.  Theone Murdoch and sheath are still in.  PA pressure 48/25  Sats 96%.      Marland Kitchen antiseptic oral rinse  15 mL Mouth Rinse BID  . clopidogrel  75 mg Oral Q0700  . digoxin  0.125 mg Oral Daily  . furosemide      . furosemide  80 mg Intravenous Once  . heparin      . heparin      . lidocaine      . lidocaine      . metoprolol      . metoprolol      . metoprolol      . metoprolol      . metoprolol  5 mg Intravenous Once  . morphine      .  morphine injection  2 mg Intravenous Once  . nitroGLYCERIN      . nitroGLYCERIN      . pantoprazole  40 mg Oral Q1200  . PHENObarbital  48.6 mg Oral Daily  . PHENobarbital  97.2 mg Oral QHS  . phenytoin  100 mg Oral BID WC  . phenytoin  200 mg Oral QHS  . potassium chloride  10 mEq Oral BID  . rosuvastatin  20 mg Oral Daily  . sodium chloride  3 mL Intravenous Q12H  . sodium chloride  3 mL Intravenous Q12H  . warfarin  7.5 mg Oral ONCE-1800  . white petrolatum      . DISCONTD: digoxin  0.125 mg Intravenous NOW  . DISCONTD: furosemide  40 mg Intravenous BID  . DISCONTD: furosemide  40 mg Intravenous Once  . DISCONTD: heparin  5,000 Units Subcutaneous Q8H      . sodium chloride 250 mL (01/31/11 1430)  . nitroGLYCERIN Stopped (02/01/11 1202)  . nitroGLYCERIN 30 mcg/min (02/02/11 0500)    Objective:  Vital Signs in the last 24 hours: Blood pressure 103/65, pulse 74, temperature 98.2 F (36.8 C), temperature source Core (Comment), resp. rate 11, height 5\' 7"  (1.702 m), weight 146 lb 9.7 oz (66.5 kg), SpO2 96.00%. Temp:  [96.2 F  (35.7 C)-98.2 F (36.8 C)] 98.2 F (36.8 C) (01/08 0600) Pulse Rate:  [69-89] 74  (01/08 0600) Resp:  [8-19] 11  (01/08 0600) BP: (80-112)/(45-68) 103/65 mmHg (01/07 2230) SpO2:  [91 %-98 %] 96 % (01/08 0600) Arterial Line BP: (95-101)/(54-58) 95/54 mmHg (01/08 0600) FiO2 (%):  [2 %] 2 % (01/07 1600) Weight:  [146 lb 9.7 oz (66.5 kg)-149 lb 14.6 oz (68 kg)] 146 lb 9.7 oz (66.5 kg) (01/08 0454)  Intake/Output from previous day: 01/07 0701 - 01/08 0700 In: 673.5 [P.O.:120; I.V.:461.5; IV Piggyback:2] Out: 3000 [Urine:3000] Intake/Output from this shift:    Physical Exam: The patient is alert and oriented x 3.  The mood and affect are normal.   Skin: warm and dry.  Color is normal.    HEENT:   the sclera are nonicteric.  The mucous membranes are moist.  The carotids are 2+ without bruits.  There is no thyromegaly.  There is no  JVD.    Lungs: clear.  The chest wall is non tender.    Heart: regular rate with a normal S1 and S2.  There are no murmurs, gallops, or rubs. The PMI is not displaced.     Abdomen: good bowel sounds.  There is no guarding or rebound.  There is no hepatosplenomegaly or tenderness.  There are no masses.   Extremities:  no clubbing, cyanosis, or edema.  The legs are without rashes.  The distal pulses are intact.   Neuro:  Cranial nerves II - XII are intact.  Motor and sensory functions are intact.     Lab Results:  Basename 01/30/11 1002  WBC 7.9  HGB 11.7*  PLT 186    Basename 02/01/11 0505 01/31/11 0007  NA 138 137  K 4.2 3.8  CL 100 99  CO2 30 30  GLUCOSE 105* 108*  BUN 31* 37*  CREATININE 0.83 1.02    Basename 02/02/11 0500 02/01/11 2230  TROPONINI <0.30 <0.30   No results found for this basename: BNP in the last 72 hours Hepatic Function Panel No results found for this basename: PROT,ALBUMIN,AST,ALT,ALKPHOS,BILITOT,BILIDIR,IBILI in the last 72 hours Lab Results  Component Value Date   CHOL 202* 12/13/2010   HDL 39* 12/13/2010    LDLCALC 135* 12/13/2010   TRIG 141 12/13/2010   CHOLHDL 5.2 12/13/2010    Basename 02/02/11 0500  INR 2.64*     Assessment/Plan:   1.  CHF:  PA pressures are significantly improved after getting IV Lasix and subsequent diuresis of 2.3 l.  Will DC swan and sheath.  Continue IV NTG for now - will transition to Imdur later.   BNP is 5365.   Add Metoprolol 12.5 BID Scheduled Lasix 40 IV BID.  2.  CAD:  No evidence of reocclusion by cath last night. CK and Troponin are negative this AM  3.  Atrial Fib:  Will add low dose Metoprolol 12.5 BID     [name], Montez Hageman., MD, [facility] 02/02/2011, 7:51 AM LOS: Day 4

## 2011-02-02 NOTE — Plan of Care (Signed)
Problem: ICU Phase Progression Outcomes Goal: Hemodynamically stable Outcome: Progressing SBP has continued to stay on the lower end in the mid 80s to mid 90s.  Will continue to monitor and contact MD as needed  Problem: Phase I Progression Outcomes Goal: Progress activity as tolerated unless otherwise ordered Outcome: Progressing Pt moved back and forth from bed to chair and back to bed today with 1 assist.  Pt tolerated well with minimal dyspnea with exertion

## 2011-02-02 NOTE — Cardiovascular Report (Signed)
NAMEMarland Kitchen  [name], BATCH NO.:  [id number]  MEDICAL RECORD NO.:  [id number]  LOCATION:  2907                         FACILITY:  [facility]  PHYSICIAN:  Nicki Guadalajara, M.D.     DATE OF BIRTH:  08-27-1933  DATE OF PROCEDURE:  02/01/2011 DATE OF DISCHARGE:                           CARDIAC CATHETERIZATION   INDICATIONS:  [name] is a 78 year old gentleman who has a history of an ischemic cardiomyopathy.  Remotely, he has had a history of CVA, prior myocardial infarction x2, and prior CABG revascularization surgery in 1998.  He is status post ICD implantation.  He has a history of atrial fibrillation and has been on Coumadin therapy.  He has an ejection fraction of approximately 15%.  Apparently in November 2012, he had undergone emergent cardiac catheterizations after there was a question of ST-segment elevation myocardial infarction.  At that time, I was called acutely to do the procedure.  He was found to have high-grade distal AV groove circumflex stenosis in a dominant left circumflex system.  He has significant native CAD with 60% proximal LAD stenosis. He has a dominant left circumflex coronary artery with 50% proximal stenosis, 80% stenosis in the proximal portion of marginal vessel, and 90% eccentric stenosis in the region of distal marginal vessel.  He had small nondominant right coronary artery.  He had a patent LIMA to the LAD.  The vein to the marginal 1 was patent with mild 20% narrowing.  He had an occluded graft of uncertain destination of possibly supplying the distal circumflex.  He underwent successful PTCA and stenting to the distal AV groove circumflex with ultimate insertion of a 3.0 x 15 mm bare-metal Vision stent.  The patient apparently was brought into the hospital today by Dr. Graciela Husbands for apparent cardioversion.  Sinus rhythm was restored.  The patient has been on Coumadin therapy with an INR of 2.2.  Apparently later today, he was seen by  Dr. [name] and was noted to have significant chest pain, increasing shortness of breath.  ECG did show significant ST elevation in paced rhythm leads.  He also had left bundle-branch block.  Because of the marked change in ECG, a "STEMI Code" was called.  I again was on-call and came in to do the procedure.  Upon arrival to the catheterization laboratory, the patient was short of breath.  He reportedly had received IV Lasix 80 mg prior to coming to the cath lab, but apparently was not diuresing significantly at this point.  His right femoral artery was punctured anteriorly and a 6-French sheath was inserted.  Diagnostic catheterization was done utilizing 6- [place] Judkins 4 left and right coronary catheters.  The right catheter was used for selective angiography in the 2 previous vein grafts.  This catheter was also used for selective angiography in the left internal mammary artery.  At this point, the pigtail catheter was then inserted for the measurement of LVEDP.  This proved to be significantly elevated. The patient received morphine 2 mg plus an additional 80 mg IV Lasix. He also was started on BiPAP with significant improvement in respiratory pattern.  Because of his elevated pressures, I did perform right heart catheterizations  via the right femoral vein, and the Swan-Ganz catheter was advanced and inserted to the RA, RV, and PA position, right pulmonary artery position.  Pressures were recorded.  Although the catheter was pushed into the wedge position, a good wedge tracing was not able to be recorded.  HEMODYNAMIC DATA:  Central aortic pressure due to respiratory variation was 110-120/88.  Left ventricular pressure 110/34, post A-wave 42.  RA pressure 24.  RV pressure 75/23.  PA pressure 75/33.  ANGIOGRAPHIC DATA:  Left main coronary artery was angiographically normal and bifurcated into an LAD and left circumflex system.  The LAD had proximal 50% stenosis before the major  second diagonal vessel.  The remainder of the LAD was free of significant disease.  The circumflex vessel was a dominant vessel.  There was 50% proximal stenosis before the OM-1 vessel.  There was flush and fill phenomena seen in the OM-1 vessel.  The stent in the distal AV groove circumflex was widely patent.  There was mild 30% narrowing distally.  The native right coronary artery was small caliber, angiographically normal, nondominant vessel.  The vein graft which was previously documented to be occluded remained occluded at the origin.  The other more superior vein graft supplied the OM-1 vessel and was free of significant disease.  The LIMA graft was widely patent and anastomosed into a small caliber mid distal LAD system.  IMPRESSION: 1. Ischemic cardiomyopathy with previously severe dilated left     ventricular function.  Left ventriculography not performed. 2. Significant native coronary artery disease with 50% proximal left     anterior descending coronary artery stenosis; 50% proximal     circumflex stenosis with widely patent stent in the distal aspect     of the atrioventricular groove circumflex and a dominant system. 3. Normal nondominant right coronary artery. 4. Occluded old vein graft which made supplied the distal circumflex     branch. 5. Patent vein graft supplying the marginal vessel. 6. Patent left internal mammary artery graft supplying the small mid     distal left anterior descending coronary artery. 7. Severe pulmonary hypertension and right-sided pressure elevation.  IMPRESSION:  [name] acute catheterization doe not demonstrate occlusion, argue against ST-segment elevation myocardial infarction. The patient's rhythm is paced.  He left the catheterization laboratory with a Swan-Ganz catheter in place for optimal management of his fluid and volume status and diuresis.  Stent was widely patent from prior intervention on December 12, 2010.  He will  be transported to the coronary care unit and care will be continued by the [facility].          ______________________________ Nicki Guadalajara, M.D.     TK/MEDQ  D:  02/01/2011  T:  02/02/2011  Job:  [id number]  cc:   Dr. [name]

## 2011-02-02 NOTE — Progress Notes (Signed)
Arterial sheath removed from RFA at 10:27am and Venous sheath removed from RFV at 10:35.  Manual pressure held for 30 minutes due to elevated INR.  Site is level zero.  Instructions given.    Avelardo Reesman Hunter, RCIS CEPS

## 2011-02-02 NOTE — Progress Notes (Signed)
ANTICOAGULATION CONSULT NOTE - Follow Up Consult  Pharmacy Consult for Coumadin Indication: atrial fibrillation  Allergies  Allergen Reactions  . Valsartan     Patient Measurements: Height: 5\' 7"  (170.2 cm) Weight: 146 lb 9.7 oz (66.5 kg) IBW/kg (Calculated) : 66.1  Adjusted Body Weight:   Vital Signs: Temp: 98 F (36.7 C) (01/08 1230) Temp src: Oral (01/08 1230) BP: 91/51 mmHg (01/08 1300) Pulse Rate: 76  (01/08 1300)  Labs:  Basename 02/02/11 0500 02/01/11 2230 02/01/11 1100 02/01/11 0505 01/31/11 1531 01/31/11 0902 01/31/11 0007  HGB -- -- -- -- -- -- --  HCT -- -- -- -- -- -- --  PLT -- -- -- -- -- -- --  APTT -- -- -- -- -- -- --  LABPROT 28.6* -- 25.4* -- 24.6* -- --  INR 2.64* -- 2.27* -- 2.18* -- --  HEPARINUNFRC -- -- -- -- -- -- --  CREATININE 1.08 -- -- 0.83 -- -- 1.02  CKTOTAL 28 31 -- -- -- 40 --  CKMB 2.3 2.4 -- -- -- 2.6 --  TROPONINI <0.30 <0.30 -- -- -- <0.30 --   Estimated Creatinine Clearance: 55.3 ml/min (by C-G formula based on Cr of 1.08).   Medications:  Prescriptions prior to admission  Medication Sig Dispense Refill  . carvedilol (COREG) 12.5 MG tablet Take 12.5 mg by mouth daily.        . clopidogrel (PLAVIX) 75 MG tablet Take 75 mg by mouth every morning.        . digoxin (LANOXIN) 0.25 MG tablet Take 250 mcg by mouth daily.        Marland Kitchen FeFum-FePoly-FA-B Cmp-C-Biot (FOLIVANE-PLUS) CAPS Take 1 capsule by mouth daily.        . furosemide (LASIX) 40 MG tablet Take 40 mg by mouth 2 (two) times daily.       Marland Kitchen lisinopril (PRINIVIL,ZESTRIL) 2.5 MG tablet Take 2.5 mg by mouth daily.        . nitroGLYCERIN (NITROSTAT) 0.4 MG SL tablet Place 1 tablet (0.4 mg total) under the tongue every 5 (five) minutes as needed for chest pain.  25 tablet  3  . omeprazole (PRILOSEC OTC) 20 MG tablet Take 20 mg by mouth daily.        Marland Kitchen PHENobarbital (LUMINAL) 97.2 MG tablet Take 48.6-97.2 mg by mouth 2 (two) times daily. Patient takes 1/2 tablet in the morning and 1  tablet in the evening      . phenytoin (DILANTIN) 100 MG ER capsule Take 100-200 mg by mouth 3 (three) times daily. Patient takes 1 tablet in the morning, 1 tablet at noon and 2 tablets in the evening      . potassium chloride (KLOR-CON) 10 MEQ CR tablet Take 10 mEq by mouth daily.       . simvastatin (ZOCOR) 80 MG tablet Take 80 mg by mouth every other day.       . warfarin (COUMADIN) 7.5 MG tablet Take 7.5 mg by mouth daily. Patient currently taking 7.5 mg daily        Assessment: 77yom with afib on chronic coumadin pta. Patient decompensated last night with increased ventricular rate/SOB and ST elevation last night he was taken emergently to cath. No changes in coronaries was found, although patient did have significant volume overload. Coumadin was given last night prior to cath. INR 2.6 this am showing a significant upward trend. Swan and sheaths were removed this morning requiring extended time holding pressure likely d/t elevated INR. D/w  Dr. Elease Hashimoto and pharmacy will continue to consult on anticoagulation but will hold dose today and allow patient to stabilize before restarting coumadin back tomorrow.  Goal of Therapy:  INR 2-3   Plan:  Hold coumadin tonight with plans to resume tomorrow  INR in am  Severiano Gilbert 02/02/2011,2:18 PM

## 2011-02-03 LAB — PROTIME-INR: INR: 2.71 — ABNORMAL HIGH (ref 0.00–1.49)

## 2011-02-03 MED ORDER — WARFARIN SODIUM 2.5 MG PO TABS
2.5000 mg | ORAL_TABLET | Freq: Once | ORAL | Status: AC
  Administered 2011-02-03: 2.5 mg via ORAL
  Filled 2011-02-03: qty 1

## 2011-02-03 MED ORDER — FUROSEMIDE 40 MG PO TABS
40.0000 mg | ORAL_TABLET | Freq: Two times a day (BID) | ORAL | Status: DC
  Administered 2011-02-03 – 2011-02-05 (×5): 40 mg via ORAL
  Filled 2011-02-03 (×8): qty 1

## 2011-02-03 NOTE — Progress Notes (Signed)
Subjective:    [name] is a 78 y.o. gentleman with a hx of ischemic cardiomyopathy - EF 15-20 %.  CAD /CABG. S/p stenting of his distal LCx in Nov. 2012.  Presented  01/29/11 with recurrent CHF.  He decompensated 02/01/11 and had acute chest pain.  Emergent cath revealed no changes in his coronaries but significant volume overload with PA pressures of 75 mmHg.  He has diuresed 2.3 liters and feels better.  Theone Murdoch and sheath were DC'd 02/02/11  PA pressure 48/25  Sats 96%.  He feels better this AM.  No CP. Breathing is much better      . antiseptic oral rinse  15 mL Mouth Rinse BID  . clopidogrel  75 mg Oral Q0700  . digoxin  0.125 mg Oral Daily  . furosemide  40 mg Intravenous BID  . isosorbide mononitrate  30 mg Oral Daily  . metoprolol  5 mg Intravenous Once  . metoprolol tartrate  12.5 mg Oral BID  .  morphine injection  2 mg Intravenous Once  . pantoprazole  40 mg Oral Q1200  . PHENObarbital  48.6 mg Oral Daily  . PHENobarbital  97.2 mg Oral QHS  . phenytoin  100 mg Oral BID WC  . phenytoin  200 mg Oral QHS  . potassium chloride  10 mEq Oral BID  . rosuvastatin  20 mg Oral Daily  . sodium chloride  250 mL Intravenous Once  . sodium chloride  250 mL Intravenous Once  . sodium chloride  3 mL Intravenous Q12H  . sodium chloride  3 mL Intravenous Q12H  . DISCONTD: metoprolol tartrate  12.5 mg Oral BID  . DISCONTD: PHENObarbital  48.6 mg Oral Daily      . nitroGLYCERIN 30 mcg/min (02/02/11 0500)  . DISCONTD: sodium chloride 250 mL (01/31/11 1430)    Objective:  Vital Signs in the last 24 hours: Blood pressure 93/56, pulse 74, temperature 97.6 F (36.4 C), temperature source Axillary, resp. rate 13, height 5\' 7"  (1.702 m), weight 143 lb 1.3 oz (64.9 kg), SpO2 95.00%. Temp:  [97.6 F (36.4 C)-99.1 F (37.3 C)] 97.6 F (36.4 C) (01/09 0357) Pulse Rate:  [72-80] 74  (01/09 0700) Resp:  [9-22] 13  (01/09 0700) BP: (80-108)/(19-62) 93/56 mmHg (01/09 0700) SpO2:   [88 %-98 %] 95 % (01/09 0700) Arterial Line BP: (103-108)/(53-55) 108/53 mmHg (01/08 1000) Weight:  [143 lb 1.3 oz (64.9 kg)] 143 lb 1.3 oz (64.9 kg) (01/09 0500)  Intake/Output from previous day: 01/08 0701 - 01/09 0700 In: 1469 [P.O.:720; I.V.:729; IV Piggyback:20] Out: 2830 [Urine:2830] Intake/Output from this shift:    Physical Exam: The patient is alert and oriented x 3.  The mood and affect are normal.   Skin: warm and dry.  Color is normal.    HEENT:   the sclera are nonicteric.  The mucous membranes are moist.  The carotids are 2+ without bruits.  There is no thyromegaly.  There is no JVD.    Lungs: clear.  The chest wall is non tender.    Heart: regular rate with a normal S1 and S2.  There are no murmurs, gallops, or rubs. The PMI is not displaced.     Abdomen: good bowel sounds.  There is no guarding or rebound.  There is no hepatosplenomegaly or tenderness.  There are no masses.   Extremities:  no clubbing, cyanosis, or edema.  The legs are without rashes.  The distal pulses are intact.   Neuro:  Cranial nerves II - XII are intact.  Motor and sensory functions are intact.     Lab Results: No results found for this basename: WBC:2,HGB:2,PLT:2 in the last 72 hours  Basename 02/02/11 0500 02/01/11 0505  NA 138 138  K 4.1 4.2  CL 99 100  CO2 29 30  GLUCOSE 113* 105*  BUN 35* 31*  CREATININE 1.08 0.83    Basename 02/02/11 0500 02/01/11 2230  TROPONINI <0.30 <0.30   No results found for this basename: BNP in the last 72 hours Hepatic Function Panel No results found for this basename: PROT,ALBUMIN,AST,ALT,ALKPHOS,BILITOT,BILIDIR,IBILI in the last 72 hours Lab Results  Component Value Date   CHOL 202* 12/13/2010   HDL 39* 12/13/2010   LDLCALC 135* 12/13/2010   TRIG 141 12/13/2010   CHOLHDL 5.2 12/13/2010    Basename 02/03/11 0510  INR 2.71*   Tele:  NSR  Assessment/Plan:   1.  CHF:  PA pressures are significantly improved after getting IV Lasix and  subsequent diuresis of 2.3 l.  Will change IV Lasix to PO  2.  CAD:  No evidence of reocclusion by cath last night. CK and Troponin are negative this AM  3.  Atrial Fib:  Will add low dose Metoprolol 12.5 BID     [name], Montez Hageman., MD, [facility] 02/03/2011, 8:18 AM LOS: Day 5

## 2011-02-03 NOTE — Progress Notes (Signed)
   CARE MANAGEMENT NOTE 02/03/2011  Patient:  [name],[name]   Account Number:  [id number]  Date Initiated:  02/03/2011  Documentation initiated by:  GRAVES-BIGELOW,Traeson Dusza  Subjective/Objective Assessment:   Pt in with chf exacerbation- stemi- s/p cath. Pt is from home with wife. Pt states he has a power chair at home, no other dme.     Action/Plan:   CM spoke to family and they are agreeable to [facility] RN for CHF management- they did choose [facility] for services. Will not make referral until order written. Pt in process of moving to [address].   Anticipated DC Date:  02/05/2011   Anticipated DC Plan:  HOME W HOME HEALTH SERVICES      DC Planning Services  CM consult      [facility] Choice  HOME HEALTH   Choice offered to / List presented to:  C-3 Spouse        HH arranged  HH-1 RN  HH-10 DISEASE MANAGEMENT      HH agency  [facility]   Status of service:  In process, will continue to follow Medicare Important Message given?   (If response is "NO", the following Medicare IM given date fields will be blank) Date Medicare IM given:   Date Additional Medicare IM given:    Discharge Disposition:  HOME W HOME HEALTH SERVICES  Per UR Regulation:    Comments:  02-03-11 1210 Tomi Bamberger, RN,BSN [phone] CM will not make referral with [facility] until MD has written order for [facility] RN for chf managment. Will benefit from PT/OT consult for disposition recommendations. Will continue to monitor.

## 2011-02-03 NOTE — Progress Notes (Signed)
ANTICOAGULATION CONSULT NOTE - Follow Up Consult  Pharmacy Consult for warfarin  Indication: atrial fibrillation   Assessment:  77 yom with afib on chronic coumadin. Recent decompensation/possible ACS taken emergently to cath. Patient now stable with therapeutic INR at 2.7. No bleeding complications have been noted. Given that INR is still trending upward with last dose 02/01/11 will only give small dose tonight.  Goal of Therapy:  INR 2-3   Plan:  Warfarin 2.5 tonight INR in am   Allergies  Allergen Reactions  . Valsartan     Patient Measurements: Height: 5\' 7"  (170.2 cm) Weight: 143 lb 1.3 oz (64.9 kg) (bed weight) IBW/kg (Calculated) : 66.1     Vital Signs: Temp: 97.9 F (36.6 C) (01/09 0822) Temp src: Oral (01/09 0822) BP: 98/54 mmHg (01/09 0900) Pulse Rate: 76  (01/09 0900)  Labs:  Basename 02/03/11 0510 02/02/11 0500 02/01/11 2230 02/01/11 1100 02/01/11 0505  HGB -- -- -- -- --  HCT -- -- -- -- --  PLT -- -- -- -- --  APTT -- -- -- -- --  LABPROT 29.2* 28.6* -- 25.4* --  INR 2.71* 2.64* -- 2.27* --  HEPARINUNFRC -- -- -- -- --  CREATININE -- 1.08 -- -- 0.83  CKTOTAL -- 28 31 -- --  CKMB -- 2.3 2.4 -- --  TROPONINI -- <0.30 <0.30 -- --   Estimated Creatinine Clearance: 54.3 ml/min (by C-G formula based on Cr of 1.08).   Medications:  Scheduled:    . antiseptic oral rinse  15 mL Mouth Rinse BID  . clopidogrel  75 mg Oral Q0700  . digoxin  0.125 mg Oral Daily  . furosemide  40 mg Oral BID  . isosorbide mononitrate  30 mg Oral Daily  . metoprolol  5 mg Intravenous Once  . metoprolol tartrate  12.5 mg Oral BID  .  morphine injection  2 mg Intravenous Once  . pantoprazole  40 mg Oral Q1200  . PHENObarbital  48.6 mg Oral Daily  . PHENobarbital  97.2 mg Oral QHS  . phenytoin  100 mg Oral BID WC  . phenytoin  200 mg Oral QHS  . potassium chloride  10 mEq Oral BID  . rosuvastatin  20 mg Oral Daily  . sodium chloride  250 mL Intravenous Once  . sodium  chloride  250 mL Intravenous Once  . sodium chloride  3 mL Intravenous Q12H  . sodium chloride  3 mL Intravenous Q12H  . DISCONTD: furosemide  40 mg Intravenous BID    Severiano Gilbert 02/03/2011,10:56 AM

## 2011-02-04 MED ORDER — WARFARIN SODIUM 5 MG PO TABS
5.0000 mg | ORAL_TABLET | Freq: Once | ORAL | Status: AC
  Administered 2011-02-04: 5 mg via ORAL
  Filled 2011-02-04: qty 1

## 2011-02-04 NOTE — Progress Notes (Signed)
Subjective:    Mr.Yeske is a 78 y.o. gentleman with a hx of ischemic cardiomyopathy - EF 15-20 %.  CAD /CABG. S/p stenting of his distal LCx in Nov. 2012.  Presented  01/29/11 with recurrent CHF.  He decompensated 02/01/11 and had acute chest pain.  Emergent cath revealed no changes in his coronaries but significant volume overload with PA pressures of 75 mmHg.  He has diuresed 2.3 liters and feels better.  Theone Murdoch and sheath were DC'd 02/02/11  PA pressure 48/25 Sats 96%.  He feels better this AM.  No CP. Breathing is much better  He's been up out of bed. He has not yet to walk in the hallways.    Marland Kitchen antiseptic oral rinse  15 mL Mouth Rinse BID  . clopidogrel  75 mg Oral Q0700  . digoxin  0.125 mg Oral Daily  . furosemide  40 mg Oral BID  . isosorbide mononitrate  30 mg Oral Daily  . metoprolol  5 mg Intravenous Once  . metoprolol tartrate  12.5 mg Oral BID  .  morphine injection  2 mg Intravenous Once  . pantoprazole  40 mg Oral Q1200  . PHENObarbital  48.6 mg Oral Daily  . PHENobarbital  97.2 mg Oral QHS  . phenytoin  100 mg Oral BID WC  . phenytoin  200 mg Oral QHS  . potassium chloride  10 mEq Oral BID  . rosuvastatin  20 mg Oral Daily  . sodium chloride  3 mL Intravenous Q12H  . warfarin  2.5 mg Oral ONCE-1800  . DISCONTD: furosemide  40 mg Intravenous BID  . DISCONTD: sodium chloride  3 mL Intravenous Q12H      . DISCONTD: nitroGLYCERIN 30 mcg/min (02/02/11 0500)    Objective:  Vital Signs in the last 24 hours: Blood pressure 102/70, pulse 72, temperature 98 F (36.7 C), temperature source Oral, resp. rate 18, height 5\' 8"  (1.727 m), weight 142 lb (64.411 kg), SpO2 96.00%. Temp:  [97.8 F (36.6 C)-98.8 F (37.1 C)] 98 F (36.7 C) (01/10 0337) Pulse Rate:  [72-77] 72  (01/10 0337) Resp:  [12-18] 18  (01/10 0337) BP: (94-106)/(54-70) 102/70 mmHg (01/10 0337) SpO2:  [94 %-97 %] 96 % (01/10 0337) Weight:  [142 lb (64.411 kg)-142 lb 6.4 oz (64.592 kg)] 142 lb  (64.411 kg) (01/10 0337) His weight is down 1 pound from yesterday. Intake/Output from previous day: 01/09 0701 - 01/10 0700 In: 1569 [P.O.:1560; I.V.:9] Out: 1652 [Urine:1650; Stool:2] Intake/Output from this shift:    Physical Exam: The patient is alert and oriented x 3.  The mood and affect are normal.   Skin: warm and dry.  Color is normal.    HEENT:   the sclera are nonicteric.  The mucous membranes are moist.  The carotids are 2+ without bruits.  There is no thyromegaly.  There is no JVD.    Lungs: clear.  The chest wall is non tender.    Heart: regular rate with a normal S1 and S2.  There are no murmurs, gallops, or rubs. The PMI is not displaced.     Abdomen: good bowel sounds.  There is no guarding or rebound.  There is no hepatosplenomegaly or tenderness.  There are no masses.   Extremities:  no clubbing, cyanosis, or edema.  The legs are without rashes.  The distal pulses are intact.   Neuro:  Cranial nerves II - XII are intact.  Motor and sensory functions are intact.  Lab Results: No results found for this basename: WBC:2,HGB:2,PLT:2 in the last 72 hours  Basename 02/02/11 0500  NA 138  K 4.1  CL 99  CO2 29  GLUCOSE 113*  BUN 35*  CREATININE 1.08    Basename 02/02/11 0500 02/01/11 2230  TROPONINI <0.30 <0.30   No results found for this basename: BNP in the last 72 hours Hepatic Function Panel No results found for this basename: PROT,ALBUMIN,AST,ALT,ALKPHOS,BILITOT,BILIDIR,IBILI in the last 72 hours Lab Results  Component Value Date   CHOL 202* 12/13/2010   HDL 39* 12/13/2010   LDLCALC 135* 12/13/2010   TRIG 141 12/13/2010   CHOLHDL 5.2 12/13/2010    Basename 02/04/11 0540  INR 1.90*   Tele:  NSR  Assessment/Plan:   1.  CHF:  PA pressures are significantly improved after getting IV Lasix and subsequent diuresis of 2.3 l.  Home tomorrow if he remains stable  2.  CAD:  No evidence of reocclusion by cath last night. CK and Troponin are  negative this AM  3.  Atrial Fib:  Will add low dose Metoprolol 12.5 BID.  Getting coumadin   [name], Montez Hageman., MD, [facility] 02/04/2011, 8:01 AM LOS: Day 6

## 2011-02-04 NOTE — Progress Notes (Signed)
ANTICOAGULATION CONSULT NOTE - Follow Up Consult  Pharmacy Consult for Coumadin Indication: atrial fibrillation  Allergies  Allergen Reactions  . Valsartan     Patient Measurements: Height: 5\' 8"  (172.7 cm) Weight: 142 lb (64.411 kg) (sscale a) IBW/kg (Calculated) : 68.4    Vital Signs: Temp: 98 F (36.7 C) (01/10 0337) Temp src: Oral (01/10 0337) BP: 100/60 mmHg (01/10 0939) Pulse Rate: 75  (01/10 0938)  Labs:  Basename 02/04/11 0540 02/03/11 0510 02/02/11 0500 02/01/11 2230  HGB -- -- -- --  HCT -- -- -- --  PLT -- -- -- --  APTT -- -- -- --  LABPROT 22.1* 29.2* 28.6* --  INR 1.90* 2.71* 2.64* --  HEPARINUNFRC -- -- -- --  CREATININE -- -- 1.08 --  CKTOTAL -- -- 28 31  CKMB -- -- 2.3 2.4  TROPONINI -- -- <0.30 <0.30   Estimated Creatinine Clearance: 53.8 ml/min (by C-G formula based on Cr of 1.08).   Medications:  Prescriptions prior to admission  Medication Sig Dispense Refill  . carvedilol (COREG) 12.5 MG tablet Take 12.5 mg by mouth daily.        . clopidogrel (PLAVIX) 75 MG tablet Take 75 mg by mouth every morning.        . digoxin (LANOXIN) 0.25 MG tablet Take 250 mcg by mouth daily.        Marland Kitchen FeFum-FePoly-FA-B Cmp-C-Biot (FOLIVANE-PLUS) CAPS Take 1 capsule by mouth daily.        . furosemide (LASIX) 40 MG tablet Take 40 mg by mouth 2 (two) times daily.       Marland Kitchen lisinopril (PRINIVIL,ZESTRIL) 2.5 MG tablet Take 2.5 mg by mouth daily.        . nitroGLYCERIN (NITROSTAT) 0.4 MG SL tablet Place 1 tablet (0.4 mg total) under the tongue every 5 (five) minutes as needed for chest pain.  25 tablet  3  . omeprazole (PRILOSEC OTC) 20 MG tablet Take 20 mg by mouth daily.        Marland Kitchen PHENobarbital (LUMINAL) 97.2 MG tablet Take 48.6-97.2 mg by mouth 2 (two) times daily. Patient takes 1/2 tablet in the morning and 1 tablet in the evening      . phenytoin (DILANTIN) 100 MG ER capsule Take 100-200 mg by mouth 3 (three) times daily. Patient takes 1 tablet in the morning, 1 tablet  at noon and 2 tablets in the evening      . potassium chloride (KLOR-CON) 10 MEQ CR tablet Take 10 mEq by mouth daily.       . simvastatin (ZOCOR) 80 MG tablet Take 80 mg by mouth every other day.       . warfarin (COUMADIN) 7.5 MG tablet Take 7.5 mg by mouth daily. Patient currently taking 7.5 mg daily       Scheduled:    . antiseptic oral rinse  15 mL Mouth Rinse BID  . clopidogrel  75 mg Oral Q0700  . digoxin  0.125 mg Oral Daily  . furosemide  40 mg Oral BID  . isosorbide mononitrate  30 mg Oral Daily  . metoprolol tartrate  12.5 mg Oral BID  . pantoprazole  40 mg Oral Q1200  . PHENObarbital  48.6 mg Oral Daily  . PHENobarbital  97.2 mg Oral QHS  . phenytoin  100 mg Oral BID WC  . phenytoin  200 mg Oral QHS  . potassium chloride  10 mEq Oral BID  . rosuvastatin  20 mg Oral Daily  . sodium  chloride  3 mL Intravenous Q12H  . warfarin  2.5 mg Oral ONCE-1800  . DISCONTD: metoprolol  5 mg Intravenous Once  . DISCONTD:  morphine injection  2 mg Intravenous Once  . DISCONTD: sodium chloride  3 mL Intravenous Q12H   Infusions:   Anti-infectives    None      Assessment: 77 yom with afib on chronic coumadin.  Following Coumadin restart, INR variable with resumption on home doses.  Phenobarbital and phenytoin doses unchanged.  No new drug interactions ID'd.  Goal of Therapy:  INR 2-3   Plan:  Coumadin 5mg  today. Continue daily PT/INR.  Maecyn Panning, Elisha Headland, Pharm.D. 02/04/2011 11:13 AM

## 2011-02-05 ENCOUNTER — Telehealth: Payer: Self-pay | Admitting: Cardiovascular Disease

## 2011-02-05 LAB — PROTIME-INR
INR: 1.46 (ref 0.00–1.49)
Prothrombin Time: 18 seconds — ABNORMAL HIGH (ref 11.6–15.2)

## 2011-02-05 MED ORDER — WARFARIN SODIUM 7.5 MG PO TABS
7.5000 mg | ORAL_TABLET | Freq: Once | ORAL | Status: DC
  Filled 2011-02-05: qty 1

## 2011-02-05 MED ORDER — METOPROLOL TARTRATE 12.5 MG HALF TABLET: 12.5000 mg | ORAL_TABLET | Freq: Two times a day (BID) | ORAL | Status: DC

## 2011-02-05 MED ORDER — DIGOXIN 250 MCG PO TABS: 125.0000 ug | ORAL_TABLET | Freq: Every day | ORAL | Status: AC

## 2011-02-05 NOTE — Progress Notes (Addendum)
Subjective:    [name] is a 78 y.o. gentleman with a hx of ischemic cardiomyopathy - EF 15-20 %.  CAD /CABG. S/p stenting of his distal LCx in Nov. 2012.  Presented  01/29/11 with recurrent CHF.  He decompensated 02/01/11 and had acute chest pain.  Emergent cath revealed no changes in his coronaries but significant volume overload with PA pressures of 75 mmHg.  He has diuresed 2.3 liters and feels better.  Theone Murdoch and sheath were DC'd 02/02/11  PA pressure 48/25 Sats 96%.  He feels better this AM.  No CP. Breathing is much better  He's been walking with the nurses last night and has not had any chest pain or dyspnea.  His BP is low this AM but he is asymptomatic.    Marland Kitchen antiseptic oral rinse  15 mL Mouth Rinse BID  . clopidogrel  75 mg Oral Q0700  . digoxin  0.125 mg Oral Daily  . furosemide  40 mg Oral BID  . isosorbide mononitrate  30 mg Oral Daily  . metoprolol tartrate  12.5 mg Oral BID  . pantoprazole  40 mg Oral Q1200  . PHENObarbital  48.6 mg Oral Daily  . PHENobarbital  97.2 mg Oral QHS  . phenytoin  100 mg Oral BID WC  . phenytoin  200 mg Oral QHS  . potassium chloride  10 mEq Oral BID  . rosuvastatin  20 mg Oral Daily  . sodium chloride  3 mL Intravenous Q12H  . warfarin  5 mg Oral ONCE-1800  . DISCONTD: metoprolol  5 mg Intravenous Once  . DISCONTD:  morphine injection  2 mg Intravenous Once      Objective:  Vital Signs in the last 24 hours: Blood pressure 81/54, pulse 74, temperature 97.4 F (36.3 C), temperature source Oral, resp. rate 18, height 5\' 8"  (1.727 m), weight 141 lb 1.6 oz (64.003 kg), SpO2 98.00%. Temp:  [97.4 F (36.3 C)] 97.4 F (36.3 C) (01/11 0500) Pulse Rate:  [74-75] 74  (01/11 0500) Resp:  [18-19] 18  (01/11 0500) BP: (81-102)/(52-61) 81/54 mmHg (01/11 0500) SpO2:  [97 %-100 %] 98 % (01/11 0500) Weight:  [141 lb 1.6 oz (64.003 kg)] 141 lb 1.6 oz (64.003 kg) (01/11 0500) His weight is down 1 pound from yesterday. Intake/Output from  previous day: 01/10 0701 - 01/11 0700 In: 679 [P.O.:670; I.V.:9] Out: 1025 [Urine:1025] Intake/Output from this shift:    Physical Exam: The patient is alert and oriented x 3.  The mood and affect are normal.   Skin: warm and dry.  Color is normal.    HEENT:   the sclera are nonicteric.  The mucous membranes are moist.  The carotids are 2+ without bruits.  There is no thyromegaly.  There is no JVD.    Lungs: clear.  The chest wall is non tender.    Heart: regular rate with a normal S1 and S2.  There are no murmurs, gallops, or rubs. The PMI is not displaced.     Abdomen: good bowel sounds.  There is no guarding or rebound.  There is no hepatosplenomegaly or tenderness.  There are no masses.   Extremities:  no clubbing, cyanosis, or edema.  The legs are without rashes.  The distal pulses are intact.   Neuro:  Cranial nerves II - XII are intact.  Motor and sensory functions are intact.     Lab Results: No results found for this basename: WBC:2,HGB:2,PLT:2 in the last 72 hours No  results found for this basename: NA:2,K:2,CL:2,CO2:2,GLUCOSE:2,BUN:2,CREATININE:2 in the last 72 hours No results found for this basename: TROPONINI:2,CK,MB:2 in the last 72 hours No results found for this basename: BNP in the last 72 hours Hepatic Function Panel No results found for this basename: PROT,ALBUMIN,AST,ALT,ALKPHOS,BILITOT,BILIDIR,IBILI in the last 72 hours Lab Results  Component Value Date   CHOL 202* 12/13/2010   HDL 39* 12/13/2010   LDLCALC 135* 12/13/2010   TRIG 141 12/13/2010   CHOLHDL 5.2 12/13/2010    Basename 02/05/11 0601  INR 1.46   Tele:  NSR  Assessment/Plan:   1.  CHF:  PA pressures are significantly improved after getting IV Lasix and subsequent diuresis of 2.3 l.  Home tomorrow if he remains stable.  Will DC ;the Imdur due to hypotension.  2.  CAD:  No angina  3.  Atrial Fib:  Rate is well controlled.  Continue coumadin  DC to home today.  To see [place] in several  weeks.  Coumadin clinic Monday or Tuesday. I've told the patient to weigh himself daily and call us if his weight increases by 3 lbs in a day or 5 lbs in a week.   Total planning time for DC 35 minutes.   [name], Montez Hageman., MD, [facility] 02/05/2011, 7:45 AM LOS: Day 7

## 2011-02-05 NOTE — Progress Notes (Signed)
ANTICOAGULATION CONSULT NOTE - Follow Up Consult  Pharmacy Consult for Coumadin Indication: atrial fibrillation  Allergies  Allergen Reactions  . Valsartan     Patient Measurements: Height: 5\' 8"  (172.7 cm) Weight: 141 lb 1.6 oz (64.003 kg) (scale c) IBW/kg (Calculated) : 68.4    Vital Signs: Temp: 97.4 F (36.3 C) (01/11 0500) Temp src: Oral (01/11 0500) BP: 81/54 mmHg (01/11 0500) Pulse Rate: 74  (01/11 0500)  Labs:  Basename 02/05/11 0601 02/04/11 0540 02/03/11 0510  HGB -- -- --  HCT -- -- --  PLT -- -- --  APTT -- -- --  LABPROT 18.0* 22.1* 29.2*  INR 1.46 1.90* 2.71*  HEPARINUNFRC -- -- --  CREATININE -- -- --  CKTOTAL -- -- --  CKMB -- -- --  TROPONINI -- -- --   Estimated Creatinine Clearance: 53.5 ml/min (by C-G formula based on Cr of 1.08).   Medications:  Prescriptions prior to admission  Medication Sig Dispense Refill  . clopidogrel (PLAVIX) 75 MG tablet Take 75 mg by mouth every morning.        Marland Kitchen FeFum-FePoly-FA-B Cmp-C-Biot (FOLIVANE-PLUS) CAPS Take 1 capsule by mouth daily.        . furosemide (LASIX) 40 MG tablet Take 40 mg by mouth 2 (two) times daily.       . nitroGLYCERIN (NITROSTAT) 0.4 MG SL tablet Place 1 tablet (0.4 mg total) under the tongue every 5 (five) minutes as needed for chest pain.  25 tablet  3  . omeprazole (PRILOSEC OTC) 20 MG tablet Take 20 mg by mouth daily.        Marland Kitchen PHENobarbital (LUMINAL) 97.2 MG tablet Take 48.6-97.2 mg by mouth 2 (two) times daily. Patient takes 1/2 tablet in the morning and 1 tablet in the evening      . phenytoin (DILANTIN) 100 MG ER capsule Take 100-200 mg by mouth 3 (three) times daily. Patient takes 1 tablet in the morning, 1 tablet at noon and 2 tablets in the evening      . potassium chloride (KLOR-CON) 10 MEQ CR tablet Take 10 mEq by mouth daily.       . simvastatin (ZOCOR) 80 MG tablet Take 80 mg by mouth every other day.       . warfarin (COUMADIN) 7.5 MG tablet Take 7.5 mg by mouth daily. Patient  currently taking 7.5 mg daily      . DISCONTD: carvedilol (COREG) 12.5 MG tablet Take 12.5 mg by mouth daily.        Marland Kitchen DISCONTD: digoxin (LANOXIN) 0.25 MG tablet Take 250 mcg by mouth daily.        Marland Kitchen DISCONTD: lisinopril (PRINIVIL,ZESTRIL) 2.5 MG tablet Take 2.5 mg by mouth daily.         Scheduled:     . antiseptic oral rinse  15 mL Mouth Rinse BID  . clopidogrel  75 mg Oral Q0700  . digoxin  0.125 mg Oral Daily  . furosemide  40 mg Oral BID  . metoprolol tartrate  12.5 mg Oral BID  . pantoprazole  40 mg Oral Q1200  . PHENObarbital  48.6 mg Oral Daily  . PHENobarbital  97.2 mg Oral QHS  . phenytoin  100 mg Oral BID WC  . phenytoin  200 mg Oral QHS  . potassium chloride  10 mEq Oral BID  . rosuvastatin  20 mg Oral Daily  . sodium chloride  3 mL Intravenous Q12H  . warfarin  5 mg Oral ONCE-1800  .  DISCONTD: isosorbide mononitrate  30 mg Oral Daily   Infusions:   Anti-infectives    None      Assessment: 77 yom with afib on chronic coumadin.  Following Coumadin restart, INR variable with resumption on home doses.  Phenobarbital and phenytoin doses unchanged.  No new drug interactions ID'd.  Goal of Therapy:  INR 2-3   Plan:  Coumadin 7. 5mg  today if not discharged home. Continue daily PT/INR.  Elwin Sleight, Pharm.D. 02/05/2011 10:10 AM

## 2011-02-05 NOTE — Telephone Encounter (Signed)
Told to call pcp to evaluate need for home services,

## 2011-02-05 NOTE — Discharge Summary (Signed)
CARDIOLOGY DISCHARGE SUMMARY   Patient ID: [name] MRN: [id number] DOB/AGE: 1933/12/15 78 y.o.  Admit date: 01/29/2011 Discharge date: 02/05/2011  Primary Discharge Diagnosis:  Acute on chronic systolic congestive heart failure Secondary Discharge Diagnosis:  Patient Active Problem List  Diagnoses  . DYSLIPIDEMIA  . TOBACCO USER  . CARDIOMYOPATHY, ISCHEMIC  . SINUS BRADYCARDIA  . CVA  . GERD  . SEIZURE DISORDER  . Other malaise and fatigue  . AUTOMATIC IMPLANTABLE CARDIAC DEFIBRILLATOR SITU -Medtronic maximo DR serial #[id number] H  . CAD (coronary artery disease) status post stent to the distal circumflex in November of 2012   . Hypotension  . Atrial fibrillation   Procedures:Emergent Right and Left heart catherization. Portable chest x-ray. 2-D echocardiogram. BiPAP   Hospital Course: [name] is a 78 year old male with a history of coronary artery disease. He had shortness of breath and chest heaviness. He came to the emergency room. He was found to be in pulmonary edema, given IV Lasix and started on BiPAP.  He was admitted to the CCU. He was in atrial fibrillation and this was felt to be contributing to his heart failure. A cardioversion was scheduled and performed on 02/01/2011. After the cardioversion, his pacer was interrogated and reprogrammed to sinus rhythm, baseline 80 beats per minute.  His respiratory status improved initially with IV Lasix and BiPAP but he had worsening shortness of breath and chest pain on 02/01/2011. Pt received total 3 SL nitro, 17mg  IV lopressor, 2mg  iv morphine, 40mg  iv lasix (in addition to scheduled 40 iv lasix) and iv nitro was increased to . His EKG was abnormal and a Code STEMI was called.   He had an emergent cardiac catheterization but there were no new blockages. His right ventricular/PA pressures were markedly increased. He was started back on BiPAP and given more Lasix.  His weight decreased by 8 pounds during the course of  his hospital stay. As he diuresed, his oxygen saturation improved. He was started on anterior but his blood pressure would not tolerate it. His beta blocker had been changed from Coreg to metoprolol. Because of his atrial fibrillation he was on Coumadin and this was continued. He had been on lisinopril prior to admission but this was held because of hypotension. Currently, he is tolerating a low dose beta blocker and restarting the ACE inhibitor can be done as an outpatient if his blood pressure improves. On 02/05/2011, [name] was seen by Dr. Elease Hashimoto. He had improved and is considered stable for discharge, to followup as an outpatient.  Cath: 02/01/2011 RA 24  RV 75/23  PA 75/33  PC not able to wedge .  Ao 110-120/88  LV 110/34 post a wave 42  Patent LIMA, patent SVG to om and known occlusion of prior graft to uncertain site (?distal LCX)  LAD 50% prox stenosis  LCX 50% prox stenosis; patent stent to disatal AV groove LCX.  Small NDRCA.  Labs:   Lab Results  Component Value Date   WBC 7.9 01/30/2011   HGB 11.7* 01/30/2011   HCT 34.7* 01/30/2011   MCV 93.8 01/30/2011   PLT 186 01/30/2011    Lab 02/02/11 0500  NA 138  K 4.1  CL 99  CO2 29  BUN 35*  CREATININE 1.08  CALCIUM 8.2*  PROT --  BILITOT --  ALKPHOS --  ALT --  AST --  GLUCOSE 113*   Cardiac enzymes negative x 3  Pro B Natriuretic peptide (BNP)  Date/Time Value Range Status  02/02/2011  5:00 AM 5365.0* 0-450 (pg/mL) Final  01/29/2011  9:56 PM 4870.0* 0-450 (pg/mL) Final   ABG    Component Value Date/Time   PHART 7.316* 02/01/2011 1936   PCO2ART 50.3* 02/01/2011 1936   PO2ART 115.0* 02/01/2011 1936   HCO3 25.6* 02/01/2011 1936   TCO2 27 02/01/2011 1936   ACIDBASEDEF 1.0 02/01/2011 1936   O2SAT 98.0 02/01/2011 1936     Radiology: 01/29/2011  PORTABLE CHEST - 1 VIEW Comparison: 12/14/2010. Findings: Enlarged cardiac silhouette with an interval increase in size. Interval mild diffuse bilateral airspace opacity. More pronounced  airspace opacity in the medial left lower lobe. Stable left subclavian pacer and AICD leads. Stable post CABG changes. Diffuse osteopenia. IMPRESSION: 1. Progressive cardiomegaly with interval changes of congestive heart failure. 2. Left lower lobe atelectasis or pneumonia.  EKG: 01-Feb-2011 18:15:12  wide complex tachycardia Vent. rate 137 BPM Since last tracing rate faster Left bundle branch block Abnormal ECG PR interval 152 ms QRS duration 142 ms QT/QTc 304/459 ms P-R-T axes * 22 61 Test UJW:[id number]  Echo: Study Conclusions - Left ventricle: The cavity size was severely dilated. Wall thickness was normal. The estimated ejection fraction was 15%. Diffuse hypokinesis. Doppler parameters are consistent with high ventricular filling pressure. - Mitral valve: Mild to moderate regurgitation.  - Left atrium: The atrium was mildly dilated. - Right ventricle: The cavity size was mildly dilated. Systolic function was mildly reduced. - Right atrium: The atrium was mildly dilated. - Pulmonary arteries: PA peak pressure: 40mm Hg (S).   FOLLOW UP PLANS AND APPOINTMENTS  Future Appointments: Provider: Department: Dept Phone:   02/09/2011 9:30 AM Raul Del, RN [facility] [phone]   02/15/2011 9:00 AM Rosalio Macadamia, NP [facility] [phone]   04/14/2011 9:30 AM Elyn Aquas., MD [facility] [phone]   Future Orders   Diet - low sodium heart healthy    Increase activity slowly    (HEART FAILURE PATIENTS) Call MD:  Anytime you have any of the following symptoms:  1) 3 pound weight gain in 24 hours or 5 pounds in 1 week  2) shortness of breath, with or without a dry hacking cough  3) swelling in the hands, feet or stomach  4) if you have to sleep on extra pillows at night in order to breathe.    Current Discharge Medication List    START taking these medications   Details  metoprolol tartrate (LOPRESSOR) 12.5 mg TABS Take 0.5 tablets (12.5 mg total)  by mouth 2 (two) times daily. Qty: 20 tablet, Refills: 11      CONTINUE these medications which have CHANGED   Details  digoxin (LANOXIN) 0.25 MG tablet Take 0.5 tablets (125 mcg total) by mouth daily. Qty: 20 tablet, Refills: 11      CONTINUE these medications which have NOT CHANGED   Details  clopidogrel (PLAVIX) 75 MG tablet Take 75 mg by mouth every morning.      FeFum-FePoly-FA-B Cmp-C-Biot (FOLIVANE-PLUS) CAPS Take 1 capsule by mouth daily.      furosemide (LASIX) 40 MG tablet Take 40 mg by mouth 2 (two) times daily.     nitroGLYCERIN (NITROSTAT) 0.4 MG SL tablet Place 1 tablet (0.4 mg total) under the tongue every 5 (five) minutes as needed for chest pain. Qty: 25 tablet, Refills: 3    omeprazole (PRILOSEC OTC) 20 MG tablet Take 20 mg by mouth daily.      PHENobarbital (LUMINAL) 97.2 MG tablet Take 48.6-97.2 mg by mouth  2 (two) times daily. Patient takes 1/2 tablet in the morning and 1 tablet in the evening    phenytoin (DILANTIN) 100 MG ER capsule Take 100-200 mg by mouth 3 (three) times daily. Patient takes 1 tablet in the morning, 1 tablet at noon and 2 tablets in the evening    potassium chloride (KLOR-CON) 10 MEQ CR tablet Take 10 mEq by mouth daily.     simvastatin (ZOCOR) 80 MG tablet Take 80 mg by mouth every other day.     warfarin (COUMADIN) 7.5 MG tablet Take 7.5 mg by mouth daily. Patient currently taking 7.5 mg daily      STOP taking these medications     carvedilol (COREG) 12.5 MG tablet      lisinopril (PRINIVIL,ZESTRIL) 2.5 MG tablet        Follow-up Information    Follow up with Elyn Aquas., MD. (Keep March appointment)    Contact information:   [address], [address] [place] [address] [phone]       Follow up with [facility]. (January 15th, 9:30am)       Follow up with Norma Fredrickson, NP. (January 21st at 9:30am)    Contact information:   [address], [address] [place]  [address] [phone]          BRING ALL MEDICATIONS WITH YOU TO FOLLOW UP APPOINTMENTS  Time spent with patient to include physician time: Signed: Theodore Demark 02/05/2011, 9:55 AM Co-Sign MD  Attending Note:   The patient was seen and examined.  Agree with assessment and plan as noted above.  See my note from earlier today.  [name], Montez Hageman., MD, [facility] 02/05/2011, 1:32 PM

## 2011-02-05 NOTE — Telephone Encounter (Signed)
Pt's wife calling to see if pt should have a hospital bed at home, pls call

## 2011-02-05 NOTE — Progress Notes (Signed)
Heart Failure packet given and explained to pt and spouse.  Verbalized understanding.

## 2011-02-06 ENCOUNTER — Telehealth: Payer: Self-pay | Admitting: Adult Health

## 2011-02-06 NOTE — Telephone Encounter (Signed)
Received phone call from [name] wife concerning dose of Lanoxin. Patient was discharged on 02/05/2011 with change in digoxin dosing. Clarification given that he is to take 0.125 mg a day which is one half of a 0.25 mg tablet. She is also given instructions concerning Coumadin clinic as it is in the same location of Dr. Elease Hashimoto.

## 2011-02-09 ENCOUNTER — Ambulatory Visit (INDEPENDENT_AMBULATORY_CARE_PROVIDER_SITE_OTHER): Payer: Medicare PPO | Admitting: *Deleted

## 2011-02-09 DIAGNOSIS — I4891 Unspecified atrial fibrillation: Secondary | ICD-10-CM

## 2011-02-09 DIAGNOSIS — Z7901 Long term (current) use of anticoagulants: Secondary | ICD-10-CM | POA: Insufficient documentation

## 2011-02-10 ENCOUNTER — Encounter: Payer: Self-pay | Admitting: Nurse Practitioner

## 2011-02-15 ENCOUNTER — Encounter: Payer: Self-pay | Admitting: Nurse Practitioner

## 2011-02-15 ENCOUNTER — Ambulatory Visit (INDEPENDENT_AMBULATORY_CARE_PROVIDER_SITE_OTHER): Payer: Medicare PPO | Admitting: Nurse Practitioner

## 2011-02-15 ENCOUNTER — Ambulatory Visit (INDEPENDENT_AMBULATORY_CARE_PROVIDER_SITE_OTHER): Payer: Medicare PPO | Admitting: *Deleted

## 2011-02-15 VITALS — BP 90/68 | HR 76 | Ht 68.0 in | Wt 151.0 lb

## 2011-02-15 DIAGNOSIS — I5020 Unspecified systolic (congestive) heart failure: Secondary | ICD-10-CM

## 2011-02-15 DIAGNOSIS — I2589 Other forms of chronic ischemic heart disease: Secondary | ICD-10-CM

## 2011-02-15 DIAGNOSIS — I4891 Unspecified atrial fibrillation: Secondary | ICD-10-CM

## 2011-02-15 DIAGNOSIS — Z7901 Long term (current) use of anticoagulants: Secondary | ICD-10-CM

## 2011-02-15 LAB — BASIC METABOLIC PANEL
BUN: 21 mg/dL (ref 6–23)
CO2: 29 mEq/L (ref 19–32)
Calcium: 8.2 mg/dL — ABNORMAL LOW (ref 8.4–10.5)
Chloride: 102 mEq/L (ref 96–112)
Creatinine, Ser: 0.9 mg/dL (ref 0.4–1.5)
GFR: 86.12 mL/min (ref >60.00)
Glucose, Bld: 83 mg/dL (ref 70–99)
Potassium: 4.2 mEq/L (ref 3.5–5.1)
Sodium: 140 mEq/L (ref 135–145)

## 2011-02-15 LAB — CBC WITH DIFFERENTIAL/PLATELET
Basophils Absolute: 0 10*3/uL (ref 0.0–0.1)
Basophils Relative: 0.2 % (ref 0.0–3.0)
Eosinophils Absolute: 0.1 10*3/uL (ref 0.0–0.7)
Eosinophils Relative: 1.1 % (ref 0.0–5.0)
HCT: 31.7 % — ABNORMAL LOW (ref 39.0–52.0)
Hemoglobin: 10.7 g/dL — ABNORMAL LOW (ref 13.0–17.0)
Lymphocytes Relative: 16.5 % (ref 12.0–46.0)
Lymphs Abs: 1.2 10*3/uL (ref 0.7–4.0)
MCHC: 33.6 g/dL (ref 30.0–36.0)
MCV: 97.1 fl (ref 78.0–100.0)
Monocytes Absolute: 0.7 10*3/uL (ref 0.1–1.0)
Monocytes Relative: 9.2 % (ref 3.0–12.0)
Neutro Abs: 5.2 10*3/uL (ref 1.4–7.7)
Neutrophils Relative %: 73 % (ref 43.0–77.0)
Platelets: 207 10*3/uL (ref 150.0–400.0)
RBC: 3.27 Mil/uL — ABNORMAL LOW (ref 4.22–5.81)
RDW: 14.1 % (ref 11.5–14.6)
WBC: 7.1 10*3/uL (ref 4.5–10.5)

## 2011-02-15 LAB — POCT INR: INR: 2.6

## 2011-02-15 NOTE — Patient Instructions (Signed)
Continue to monitor your weights.  Stay on your current medicines.  I will see you in 3 weeks. Bring your bottle of Lisinopril for me to look at.  We are going to check some labs today.  Minimize your salt use.  Call the [facility] office at [phone] if you have any questions, problems or concerns.

## 2011-02-15 NOTE — Assessment & Plan Note (Signed)
He has had a recent exacerbation of his heart failure. Seems to have been triggered by diet and missing his medicines. His family is now more in tune to checking up on him and monitoring his diet and medicines. We will recheck his labs today. Would like to try and get him back on some ACE on return. I will see again in 3 weeks. Patient is agreeable to this plan and will call if any problems develop in the interim.

## 2011-02-15 NOTE — Assessment & Plan Note (Signed)
No adverse bleeding or bruising reported. He is on both Plavix and Coumadin.

## 2011-02-15 NOTE — Assessment & Plan Note (Addendum)
Rate is currently controlled. His rhythm was irregular on my physical exam today. He remains on coumadin. He is currently not symptomatic. May need to check EKG on return.

## 2011-02-15 NOTE — Progress Notes (Signed)
[name] Date of Birth: 1934/01/11 Medical Record #[id number]  History of Present Illness: [name] is seen today for a post hospital visit. He is seen for Dr. Elease [name]. He has an ischemic cardiomyopathy. EF is 10 to 15%. He has had a recent admission for a CHF exacerbation. He was diuresed and had to use Bipap. He did have a repeat cath showing no new areas of concern. His ACE was stopped due to hypotension. He has been on both Lisinopril and Enalapril in the past. He is now on low dose metoprolol. He does take Plavix and Coumadin. No excessive bleeding or bruising reported. He does remain on chronic oxygen. No swelling reported. His weights have been stable. His family is doing a better job of checking behind him in regards to taking his medicines.  They are also doing a better job of watching his diet. Blood pressure remains under around 100 systolic. He is not dizzy or lightheaded. No chest pain. He and his family thinks he is doing ok.   Current Outpatient Prescriptions on File Prior to Visit  Medication Sig Dispense Refill  . albuterol (PROVENTIL HFA;VENTOLIN HFA) 108 (90 BASE) MCG/ACT inhaler Inhale 2 puffs into the lungs every 4 (four) hours as needed.      Marland Kitchen albuterol (PROVENTIL) (2.5 MG/3ML) 0.083% nebulizer solution Take 2.5 mg by nebulization 4 (four) times daily as needed.      . budesonide-formoterol (SYMBICORT) 160-4.5 MCG/ACT inhaler Inhale 2 puffs into the lungs 2 (two) times daily.      . clopidogrel (PLAVIX) 75 MG tablet Take 75 mg by mouth every morning.        . digoxin (LANOXIN) 0.25 MG tablet Take 0.5 tablets (125 mcg total) by mouth daily.  20 tablet  11  . FeFum-FePoly-FA-B Cmp-C-Biot (FOLIVANE-PLUS) CAPS Take 1 capsule by mouth daily.        . fish oil-omega-3 fatty acids 1000 MG capsule Take 1 g by mouth 3 (three) times daily.       . furosemide (LASIX) 40 MG tablet Take 40 mg by mouth 2 (two) times daily.       . metoprolol tartrate (LOPRESSOR) 12.5 mg TABS Take 0.5  tablets (12.5 mg total) by mouth 2 (two) times daily.  20 tablet  11  . nitroGLYCERIN (NITROSTAT) 0.4 MG SL tablet Place 1 tablet (0.4 mg total) under the tongue every 5 (five) minutes as needed for chest pain.  25 tablet  3  . omeprazole (PRILOSEC OTC) 20 MG tablet Take 20 mg by mouth daily.        Marland Kitchen PHENobarbital (LUMINAL) 97.2 MG tablet Take 48.6-97.2 mg by mouth 2 (two) times daily. Patient takes 1/2 tablet in the morning and 1 tablet in the evening      . phenytoin (DILANTIN) 100 MG ER capsule Take 100-200 mg by mouth 3 (three) times daily. Patient takes 1 tablet in the morning, 1 tablet at noon and 2 tablets in the evening      . potassium chloride (KLOR-CON) 10 MEQ CR tablet Take 10 mEq by mouth 2 (two) times daily.       . simvastatin (ZOCOR) 80 MG tablet Take 80 mg by mouth every other day.       . warfarin (COUMADIN) 5 MG tablet Take 7.5 mg by mouth daily.        Allergies  Allergen Reactions  . Valsartan     Past Medical History  Diagnosis Date  . Tobacco use disorder   .  Esophageal reflux   . Unspecified cerebral artery occlusion with cerebral infarction   . Other and unspecified hyperlipidemia   . Dual implantable cardiac defibrillator in situ     Medtronic  . ischemic cardiomyopathy     MI in 1989 with subsequent CABG, bare-metal stent to circumflex 2012, ejection fraction 10-15%  . Coronary artery disease   . Chronic systolic heart failure     EF is 15%  . Sinoatrial node dysfunction   . Atrial fibrillation     Paroxysmal  . Stroke   . Angina   . COPD (chronic obstructive pulmonary disease)   . Anemia   . Heart murmur   . Shortness of breath   . Seizures   . CHF (congestive heart failure)   . Anxiety   . Myocardial infarction   . Pneumonia   . LBBB (left bundle branch block)   . MI, old     Past Surgical History  Procedure Date  . Coronary artery bypass graft   . Insert / replace / remove pacemaker     AICD 2008  . Cardiac catheterization 10/11/1996     REVEALS MILD TO MODERATE REDUCTION OF THE LV FUNCTION. THERE IS MARKED HYPOKINESIS OF THE ANTERIOR APEX AND INFERIOR APICAL WALL  . US echocardiography 03/24/2006    EF 15-20%  . Cardiovascular stress test 06/01/2005    EF 14 %. PREVIOUS INFERIOR LATERAL MI WITH A MARKEDLY AND HYPOCONTRACTILE LV    History  Smoking status  . Former Smoker -- 1.0 packs/day for 60 years  . Types: Cigarettes  . Quit date: 12/30/2010  Smokeless tobacco  . Not on file  Comment: Started on nicotine patch    History  Alcohol Use No    Family History  Problem Relation Age of Onset  . Heart disease Mother   . Heart disease Father     Review of Systems: The review of systems is per the HPI.  All other systems were reviewed and are negative.  Physical Exam: BP 90/68  Pulse 76  Ht 5\' 8"  (1.727 m)  Wt 151 lb (68.493 kg)  BMI 22.96 kg/m2 Patient is very pleasant and in no acute distress. He does look chronically ill. Oxygen is in place. Skin is warm and dry. Color is normal.  HEENT is unremarkable. Normocephalic/atraumatic. PERRL. Sclera are nonicteric. Neck is supple. No masses. No JVD. Lungs show coarse crackles. Cardiac exam shows an irrregular rhythm. Rate is controlled. Abdomen is soft. Extremities are without edema. Gait and ROM are intact. No gross neurologic deficits noted.   LABORATORY DATA: PENDING   Assessment / Plan:

## 2011-02-22 ENCOUNTER — Telehealth: Payer: Self-pay | Admitting: Physician Assistant

## 2011-02-22 NOTE — Telephone Encounter (Signed)
Ms. Brawn called because Mr. Curto weight had gone up. She stated she weighed him at 142 pounds this morning and tonight he was 158 pounds. She stated he was not complaining of shortness of breath although his pants felt a little tight. She states he usually gains weight in his abdomen. Currently he seems to be resting pretty comfortably.  I advised her it was unlikely that Mr. Shaheen had gained 16 pounds in 1 day. I advised that if she felt like he was putting on fluid, she could give him an extra Lasix tablet today. He could take a Lasix tablet in the afternoon and take a third one at bedtime. I advised her to keep an eye on his shortness of breath and call 911 if he developed symptoms. She stated she would do so.

## 2011-02-23 ENCOUNTER — Telehealth: Payer: Self-pay | Admitting: *Deleted

## 2011-02-23 NOTE — Telephone Encounter (Signed)
Pt feeling much better only took one extra lasix. Advised to weigh one time daily, Pt verbalized understanding. Alfonso Ramus RN

## 2011-02-24 ENCOUNTER — Encounter: Payer: Self-pay | Admitting: Physician Assistant

## 2011-02-24 ENCOUNTER — Ambulatory Visit (INDEPENDENT_AMBULATORY_CARE_PROVIDER_SITE_OTHER): Payer: Medicare PPO | Admitting: Physician Assistant

## 2011-02-24 VITALS — BP 96/60 | HR 68 | Ht 68.0 in | Wt 154.0 lb

## 2011-02-24 DIAGNOSIS — I4891 Unspecified atrial fibrillation: Secondary | ICD-10-CM

## 2011-02-24 DIAGNOSIS — I2510 Atherosclerotic heart disease of native coronary artery without angina pectoris: Secondary | ICD-10-CM

## 2011-02-24 DIAGNOSIS — I959 Hypotension, unspecified: Secondary | ICD-10-CM

## 2011-02-24 DIAGNOSIS — I5023 Acute on chronic systolic (congestive) heart failure: Secondary | ICD-10-CM

## 2011-02-24 NOTE — Progress Notes (Signed)
HPI:  This is a 78 year old white male patient who is here for followup of congestive heart failure. Patient has an ischemic cardiomyopathy ejection fraction 10-15%. He had a recent hospitalization for acute pulmonary edema and atrial fibrillation requiring cardioversion. He also has a defibrillator. He fell over 80 her heart 2 weeks ago which time he was doing well.  Two days ago he decided to use his chain saw to cut wood. He then developed increased shortness of breath, chest tightness and leg edema. He took extra Lasix and was feeling better the next morning.  The patient's weight today is 154 pounds, which is up 3 pounds since he saw Jacki Cones. He states he has a little tightness and shortness of breath but overall feels pretty good.  Allergies  Allergen Reactions  . Valsartan     Current Outpatient Prescriptions on File Prior to Visit  Medication Sig Dispense Refill  . albuterol (PROVENTIL HFA;VENTOLIN HFA) 108 (90 BASE) MCG/ACT inhaler Inhale 2 puffs into the lungs every 4 (four) hours as needed.      Marland Kitchen albuterol (PROVENTIL) (2.5 MG/3ML) 0.083% nebulizer solution Take 2.5 mg by nebulization 4 (four) times daily as needed.      . budesonide-formoterol (SYMBICORT) 160-4.5 MCG/ACT inhaler Inhale 2 puffs into the lungs 2 (two) times daily.      . clopidogrel (PLAVIX) 75 MG tablet Take 75 mg by mouth every morning.        . digoxin (LANOXIN) 0.25 MG tablet Take 0.5 tablets (125 mcg total) by mouth daily.  20 tablet  11  . FeFum-FePoly-FA-B Cmp-C-Biot (FOLIVANE-PLUS) CAPS Take 1 capsule by mouth daily.        . fish oil-omega-3 fatty acids 1000 MG capsule Take 1 g by mouth 3 (three) times daily.       . furosemide (LASIX) 40 MG tablet Take 40 mg by mouth 2 (two) times daily.       . metoprolol tartrate (LOPRESSOR) 12.5 mg TABS Take 0.5 tablets (12.5 mg total) by mouth 2 (two) times daily.  20 tablet  11  . nitroGLYCERIN (NITROSTAT) 0.4 MG SL tablet Place 1 tablet (0.4 mg total) under the tongue  every 5 (five) minutes as needed for chest pain.  25 tablet  3  . omeprazole (PRILOSEC OTC) 20 MG tablet Take 20 mg by mouth daily.        Marland Kitchen PHENobarbital (LUMINAL) 97.2 MG tablet Take 48.6-97.2 mg by mouth 2 (two) times daily. Patient takes 1/2 tablet in the morning and 1 tablet in the evening      . phenytoin (DILANTIN) 100 MG ER capsule Take 100-200 mg by mouth 3 (three) times daily. Patient takes 1 tablet in the morning, 1 tablet at noon and 2 tablets in the evening      . potassium chloride (KLOR-CON) 10 MEQ CR tablet Take 10 mEq by mouth 2 (two) times daily.       . simvastatin (ZOCOR) 80 MG tablet Take 80 mg by mouth every other day.       . warfarin (COUMADIN) 5 MG tablet Take 7.5 mg by mouth daily.        Past Medical History  Diagnosis Date  . Current smoker   . Esophageal reflux   . Unspecified cerebral artery occlusion with cerebral infarction   . Other and unspecified hyperlipidemia   . Dual implantable cardiac defibrillator in situ     Medtronic  . ischemic cardiomyopathy     MI in 1989 with subsequent  CABG, bare-metal stent to circumflex 2012, ejection fraction 10-15%  . Coronary artery disease     s/p stent to the distal LCX in November 2012 - on Plavix  . Chronic systolic heart failure     EF is 15%  . Sinoatrial node dysfunction   . Atrial fibrillation     on coumadin  . Stroke   . Angina   . COPD (chronic obstructive pulmonary disease)   . Anemia   . Heart murmur   . Shortness of breath   . Seizures   . Anxiety   . Pneumonia   . LBBB (left bundle branch block)   . MI, old     Past Surgical History  Procedure Date  . Coronary artery bypass graft   . Insert / replace / remove pacemaker     AICD 2008  . Cardiac catheterization 10/11/1996    REVEALS MILD TO MODERATE REDUCTION OF THE LV FUNCTION. THERE IS MARKED HYPOKINESIS OF THE ANTERIOR APEX AND INFERIOR APICAL WALL  . US echocardiography 03/24/2006    EF 15-20%  . Cardiovascular stress test 06/01/2005     EF 14 %. PREVIOUS INFERIOR LATERAL MI WITH A MARKEDLY AND HYPOCONTRACTILE LV    Family History  Problem Relation Age of Onset  . Heart disease Mother   . Heart disease Father     History   Social History  . Marital Status: Married    Spouse Name: N/A    Number of Children: N/A  . Years of Education: N/A   Occupational History  . Not on file.   Social History Main Topics  . Smoking status: Former Smoker -- 1.0 packs/day for 60 years    Types: Cigarettes    Quit date: 12/30/2010  . Smokeless tobacco: Not on file   Comment: Started on nicotine patch  . Alcohol Use: No  . Drug Use: No  . Sexually Active: Not Currently   Other Topics Concern  . Not on file   Social History Narrative  . No narrative on file    ROS:see history of present illness   PHYSICAL EXAM: Thin, elderly, on oxygen Neck: slight increase JVD, no HJR, Bruit, or thyroid enlargement Lungs: decreased breath sounds throughout with Rales at the right base Cardiovascular: RRR, with some skipping and 2/6 systolic ejection murmur at the left sternal border, no gallops, bruit, thrill, or heave. Abdomen: BS normal. Soft without organomegaly, masses, lesions or tenderness. Extremities: +1 edema bilaterally, without cyanosis, clubbing . Good distal pulses bilateral SKin: Warm, no lesions or rashes  Musculoskeletal: No deformities Neuro: no focal signs  BP 96/60  Pulse 68  Ht 5\' 8"  (1.727 m)  Wt 154 lb (69.854 kg)  BMI 23.42 kg/m2   EKG:AV paced

## 2011-02-24 NOTE — Assessment & Plan Note (Signed)
Patient does have chest pain with his dyspnea. He had an emergency cardiac catheterization earlier this month but there were no new blockages. Continue medical therapy.

## 2011-02-24 NOTE — Patient Instructions (Signed)
Your physician recommends that you schedule a follow-up appointment in: as scheduled Your physician recommends that you return for lab work in: [facility] at your 2/11 appointment Your physician has recommended you make the following change in your medication: INCREASE your Furosemide to 60 mg twice daily for 3 days and Potassium 2 tabs twice daily for 3 days then RESUME Furosemide 40 mg bid and Potassium 1 tab twice daily     2 Gram Low Sodium Diet A 2 gram sodium diet restricts the amount of sodium in the diet to no more than 2 g or 2000 mg daily. Limiting the amount of sodium is often used to help lower blood pressure. It is important if you have heart, liver, or kidney problems. Many foods contain sodium for flavor and sometimes as a preservative. When the amount of sodium in a diet needs to be low, it is important to know what to look for when choosing foods and drinks. The following includes some information and guidelines to help make it easier for you to adapt to a low sodium diet. QUICK TIPS  Do not add salt to food.   Avoid convenience items and fast food.   Choose unsalted snack foods.   Buy lower sodium products, often labeled as "lower sodium" or "no salt added."   Check food labels to learn how much sodium is in 1 serving.   When eating at a restaurant, ask that your food be prepared with less salt or none, if possible.  READING FOOD LABELS FOR SODIUM INFORMATION The nutrition facts label is a good place to find how much sodium is in foods. Look for products with no more than 500 to 600 mg of sodium per meal and no more than 150 mg per serving. Remember that 2 g = 2000 mg. The food label may also list foods as:  Sodium-free: Less than 5 mg in a serving.   Very low sodium: 35 mg or less in a serving.   Low-sodium: 140 mg or less in a serving.   Light in sodium: 50% less sodium in a serving. For example, if a food that usually has 300 mg of sodium is changed to become light in  sodium, it will have 150 mg of sodium.   Reduced sodium: 25% less sodium in a serving. For example, if a food that usually has 400 mg of sodium is changed to reduced sodium, it will have 300 mg of sodium.  CHOOSING FOODS Grains  Avoid: Salted crackers and snack items. Some cereals, including instant hot cereals. Bread stuffing and biscuit mixes. Seasoned rice or pasta mixes.   Choose: Unsalted snack items. Low-sodium cereals, oats, puffed wheat and rice, shredded wheat. English muffins and bread. Pasta.  Meats  Avoid: Salted, canned, smoked, spiced, pickled meats, including fish and poultry. Bacon, ham, sausage, cold cuts, hot dogs, anchovies.   Choose: Low-sodium canned tuna and salmon. Fresh or frozen meat, poultry, and fish.  Dairy  Avoid: Processed cheese and spreads. Cottage cheese. Buttermilk and condensed milk. Regular cheese.   Choose: Milk. Low-sodium cottage cheese. Yogurt. Sour cream. Low-sodium cheese.  Fruits and Vegetables  Avoid: Regular canned vegetables. Regular canned tomato sauce and paste. Frozen vegetables in sauces. Olives. Rosita Fire. Relishes. Sauerkraut.   Choose: Low-sodium canned vegetables. Low-sodium tomato sauce and paste. Frozen or fresh vegetables. Fresh and frozen fruit.  Condiments  Avoid: Canned and packaged gravies. Worcestershire sauce. Tartar sauce. Barbecue sauce. Soy sauce. Steak sauce. Ketchup. Onion, garlic, and table salt. Meat  flavorings and tenderizers.   Choose: Fresh and dried herbs and spices. Low-sodium varieties of mustard and ketchup. Lemon juice. Tabasco sauce. Horseradish.  SAMPLE 2 GRAM SODIUM MEAL PLAN Breakfast / Sodium (mg)  1 cup low-fat milk / 143 mg   2 slices whole-wheat toast / 270 mg   1 tbs heart-healthy margarine / 153 mg   1 hard-boiled egg / 139 mg   1 small orange / 0 mg  Lunch / Sodium (mg)  1 cup raw carrots / 76 mg    cup hummus / 298 mg   1 cup low-fat milk / 143 mg    cup red grapes / 2 mg   1  whole-wheat pita bread / 356 mg  Dinner / Sodium (mg)  1 cup whole-wheat pasta / 2 mg   1 cup low-sodium tomato sauce / 73 mg   3 oz lean ground beef / 57 mg   1 small side salad (1 cup raw spinach leaves,  cup cucumber,  cup yellow bell pepper) with 1 tsp olive oil and 1 tsp red wine vinegar / 25 mg  Snack / Sodium (mg)  1 container low-fat vanilla yogurt / 107 mg   3 graham cracker squares / 127 mg  Nutrient Analysis  Calories: 2033   Protein: 77 g   Carbohydrate: 282 g   Fat: 72 g   Sodium: 1971 mg  Document Released: 01/11/2005 Document Revised: 09/23/2010 Document Reviewed: 04/14/2009 [facility] Patient Information 2012 [place], [place].

## 2011-02-24 NOTE — Assessment & Plan Note (Signed)
Patient has acute on chronic congestive heart failure I suspect is secondary to using the chain saw. He is up 3 pounds, has rails, and increased JVD. His renal function was stable 2 weeks ago. I will increase his Lasix to 60 mg b.i.d. For 3 days and his potassium to 10 mEq 2 b.i.d. For 3 days. He can then go back to taking Lasix 40 mg b.i.d. And Klor-Con 10 milliequivalents b.i.d. He has follow-up with Lawson Fiscal in 2 weeks and Dr. Melburn Popper in March

## 2011-02-24 NOTE — Assessment & Plan Note (Signed)
Patient's blood pressure is still low. His ACE inhibitor was stopped in the hospital because of hypotension. I will not resume it today.

## 2011-02-24 NOTE — Assessment & Plan Note (Signed)
Patient is on beta blocker and Coumadin. He denies palpitations. He had cardioversion on 02/01/11. He is pacing in the office today.

## 2011-03-08 ENCOUNTER — Encounter: Payer: Self-pay | Admitting: Nurse Practitioner

## 2011-03-08 ENCOUNTER — Ambulatory Visit (INDEPENDENT_AMBULATORY_CARE_PROVIDER_SITE_OTHER): Payer: Medicare PPO | Admitting: *Deleted

## 2011-03-08 ENCOUNTER — Ambulatory Visit (INDEPENDENT_AMBULATORY_CARE_PROVIDER_SITE_OTHER): Payer: Medicare PPO | Admitting: Nurse Practitioner

## 2011-03-08 VITALS — BP 102/60 | HR 80 | Ht 68.0 in | Wt 154.0 lb

## 2011-03-08 DIAGNOSIS — I4891 Unspecified atrial fibrillation: Secondary | ICD-10-CM

## 2011-03-08 DIAGNOSIS — D649 Anemia, unspecified: Secondary | ICD-10-CM

## 2011-03-08 DIAGNOSIS — I959 Hypotension, unspecified: Secondary | ICD-10-CM

## 2011-03-08 DIAGNOSIS — I2589 Other forms of chronic ischemic heart disease: Secondary | ICD-10-CM

## 2011-03-08 DIAGNOSIS — Z7901 Long term (current) use of anticoagulants: Secondary | ICD-10-CM

## 2011-03-08 LAB — POCT INR: INR: 2.9

## 2011-03-08 LAB — BASIC METABOLIC PANEL
Chloride: 103 mEq/L (ref 96–112)
Potassium: 4.3 mEq/L (ref 3.5–5.1)
Sodium: 138 mEq/L (ref 135–145)

## 2011-03-08 MED ORDER — FUROSEMIDE 40 MG PO TABS: ORAL_TABLET | ORAL | Status: AC

## 2011-03-08 MED ORDER — FUROSEMIDE 40 MG PO TABS: ORAL_TABLET | ORAL | Status: DC

## 2011-03-08 NOTE — Patient Instructions (Signed)
We are going to check your labs today.  We are going to increase your Lasix to 60 mg two times a day. I have sent a new prescription to [facility].  Keep your March appointment with Dr. Elease Hashimoto.  Watch the salt. Continue to weigh each day. Use your oxygen.   Call the [facility] office at [phone] if you have any questions, problems or concerns.

## 2011-03-08 NOTE — Progress Notes (Signed)
[name] Date of Birth: 10/02/1933 Medical Record #[id number]  History of Present Illness: Arrick is seen back today for a follow up visit. It is a 3 week check. He is seen for Dr. Elease Hashimoto. He has multiple medical issues which include an ischemic CM, prior BMS to the LCX in November of 2012. He remains on both Plavix and Coumadin. Has atrial fib. He is on oxygen. Has an ICD in place. No longer on his ACE due to hypotension. He has been anemic and is on iron therapy.  He comes in today. He is here with a family member. Says he is doing ok. "Still above ground". Using his oxygen. No chest pain. Not dizzy. No ICD shocks. Family is switching his primary care to [facility]. Family is concerned about his anemia. Has not had follow up labs. Remains on both Plavix and Coumadin. He is sleeping a lot. Not very active. Weights have been consistent. Family has been monitoring his medicines. Trying to watch the salt intake.   Current Outpatient Prescriptions on File Prior to Visit  Medication Sig Dispense Refill  . albuterol (PROVENTIL HFA;VENTOLIN HFA) 108 (90 BASE) MCG/ACT inhaler Inhale 2 puffs into the lungs every 4 (four) hours as needed.      Marland Kitchen albuterol (PROVENTIL) (2.5 MG/3ML) 0.083% nebulizer solution Take 2.5 mg by nebulization 4 (four) times daily as needed.      . budesonide-formoterol (SYMBICORT) 160-4.5 MCG/ACT inhaler Inhale 2 puffs into the lungs 2 (two) times daily.      . clopidogrel (PLAVIX) 75 MG tablet Take 75 mg by mouth every morning.        . digoxin (LANOXIN) 0.25 MG tablet Take 0.5 tablets (125 mcg total) by mouth daily.  20 tablet  11  . FeFum-FePoly-FA-B Cmp-C-Biot (FOLIVANE-PLUS) CAPS Take 1 capsule by mouth daily.        . fish oil-omega-3 fatty acids 1000 MG capsule Take 1 g by mouth 3 (three) times daily.       . metoprolol tartrate (LOPRESSOR) 12.5 mg TABS Take 0.5 tablets (12.5 mg total) by mouth 2 (two) times daily.  20 tablet  11  . nitroGLYCERIN (NITROSTAT)  0.4 MG SL tablet Place 1 tablet (0.4 mg total) under the tongue every 5 (five) minutes as needed for chest pain.  25 tablet  3  . omeprazole (PRILOSEC OTC) 20 MG tablet Take 20 mg by mouth daily.        Marland Kitchen PHENobarbital (LUMINAL) 97.2 MG tablet Take 48.6-97.2 mg by mouth 2 (two) times daily. Patient takes 1/2 tablet in the morning and 1 tablet in the evening      . phenytoin (DILANTIN) 100 MG ER capsule Take 100-200 mg by mouth 3 (three) times daily. Patient takes 1 tablet in the morning, 1 tablet at noon and 2 tablets in the evening      . potassium chloride (KLOR-CON) 10 MEQ CR tablet Take 10 mEq by mouth 2 (two) times daily.       . simvastatin (ZOCOR) 80 MG tablet Take 80 mg by mouth every other day.       . warfarin (COUMADIN) 5 MG tablet Take 7.5 mg by mouth daily.      Marland Kitchen DISCONTD: furosemide (LASIX) 40 MG tablet Take 40 mg by mouth 2 (two) times daily.         Allergies  Allergen Reactions  . Valsartan     Past Medical History  Diagnosis Date  . Tobacco use disorder   .  Esophageal reflux   . Unspecified cerebral artery occlusion with cerebral infarction   . Other and unspecified hyperlipidemia   . Dual implantable cardiac defibrillator in situ     Medtronic  . ischemic cardiomyopathy     MI in 1989 with subsequent CABG, bare-metal stent to circumflex 11/2010, ejection fraction 10-15%  . Coronary artery disease     s/p stent to the distal LCX in November 2012 - on Plavix  . Chronic systolic heart failure     EF is 10 to 15%  . Sinoatrial node dysfunction   . Atrial fibrillation     on coumadin  . Stroke   . Angina   . COPD (chronic obstructive pulmonary disease)   . Anemia   . Heart murmur   . Shortness of breath   . Seizures   . Anxiety   . Pneumonia   . LBBB (left bundle branch block)   . MI, old     Past Surgical History  Procedure Date  . Coronary artery bypass graft   . Icd implant 2008    AICD 2008  . Cardiac catheterization 10/11/1996    REVEALS MILD TO  MODERATE REDUCTION OF THE LV FUNCTION. THERE IS MARKED HYPOKINESIS OF THE ANTERIOR APEX AND INFERIOR APICAL WALL  . US echocardiography 03/24/2006    EF 15-20%  . Cardiovascular stress test 06/01/2005    EF 14 %. PREVIOUS INFERIOR LATERAL MI WITH A MARKEDLY AND HYPOCONTRACTILE LV  . Coronary stent placement 11/2010    BMS to the distal LCX    History  Smoking status  . Former Smoker -- 1.0 packs/day for 60 years  . Types: Cigarettes  . Quit date: 12/30/2010  Smokeless tobacco  . Not on file  Comment: Started on nicotine patch    History  Alcohol Use No    Family History  Problem Relation Age of Onset  . Heart disease Mother   . Heart disease Father     Review of Systems: The review of systems is per the HPI.  All other systems were reviewed and are negative.  Physical Exam: BP 102/60  Pulse 80  Ht 5\' 8"  (1.727 m)  Wt 154 lb (69.854 kg)  BMI 23.42 kg/m2 Patient is very pleasant and in no acute distress. He does appear chronically ill.  Skin is warm and dry. Color is normal.  HEENT is unremarkable. Normocephalic/atraumatic. PERRL. Sclera are nonicteric. Neck is supple. No masses. No JVD. Lungs have crackles about 1/3rd up bilaterally. He has oxygen in place. Cardiac exam shows an irregular rhythm. Rate is controlled. Abdomen is soft. Extremities are with trace edema. Gait and ROM are intact. No gross neurologic deficits noted.  LABORATORY DATA: PENDING  Assessment / Plan:

## 2011-03-08 NOTE — Assessment & Plan Note (Signed)
This is chronic. On coumadin along with his Plavix. Rate is controlled.

## 2011-03-08 NOTE — Assessment & Plan Note (Signed)
Patient seems to be holding his own but still looks like has some volume overload. Will increase the Lasix to 60 mg BID. We will recheck his labs today. He has an appointment to see Dr. Elease Hashimoto next month. Continue oxygen. Patient and family are agreeable to this plan and will call if any problems develop in the interim.

## 2011-03-08 NOTE — Assessment & Plan Note (Signed)
Blood pressure remains on the low side. He is asymptomatic. Would consider ACE or aldactone in the future. Rechecking his labs today.

## 2011-03-08 NOTE — Assessment & Plan Note (Addendum)
He has been anemic. He is on both Plavix and Coumadin. His BMS to the LCX was in November. Will talk to Dr. Elease Hashimoto about stopping Plavix.   Have discussed with Dr. Melburn Popper. If remains anemic, will stop the Plavix.

## 2011-03-10 ENCOUNTER — Telehealth: Payer: Self-pay | Admitting: *Deleted

## 2011-03-10 NOTE — Telephone Encounter (Signed)
Informed labs were satisfactory, and that Lawson Fiscal and Dr Elease Hashimoto discussed medications and he may stop plavix. Wife verbalized understanding.

## 2011-03-10 NOTE — Progress Notes (Signed)
Called pt explained need for cbc, lives in [place] and difficult to get to [place], I will call his new pcp tomorrow that he is establishing with next week, Dr Johny Chess [facility]/ [phone]. I will see if he can come there to have lab draw. On prior note Dr Elease Hashimoto said it was ok to stop plavix fyi

## 2011-03-11 ENCOUNTER — Telehealth: Payer: Self-pay | Admitting: *Deleted

## 2011-03-11 ENCOUNTER — Encounter: Payer: Self-pay | Admitting: Cardiovascular Disease

## 2011-03-11 ENCOUNTER — Telehealth: Payer: Self-pay | Admitting: Cardiovascular Disease

## 2011-03-11 NOTE — Telephone Encounter (Signed)
New Problem   Patient wife called concerned that patient is experiencing a lot of congestion. Patient head, throat and chest hurting.  Please return call to patient wife on mobile # [phone] to discuss possible treatment.

## 2011-03-11 NOTE — Telephone Encounter (Signed)
Called pt new pcp this morning and got him an app, also sent them the order for cbc, they will fax it back. Please watch for result.

## 2011-03-11 NOTE — Telephone Encounter (Signed)
Message copied by Antony Odea on Thu Mar 11, 2011  6:08 PM ------      Message from: Rosalio Macadamia      Created: Wed Mar 10, 2011  4:01 PM       The BMET is ok. CBC should have been ordered. No change with medicines but needs CBC. Will probably be stopping his Plavix if remains anemic. Have discussed with Dr. Elease Hashimoto.             (Message regarding stress test is in error).

## 2011-03-11 NOTE — Telephone Encounter (Signed)
GOT PT TO PCP TODAY

## 2011-03-12 ENCOUNTER — Other Ambulatory Visit: Payer: Self-pay

## 2011-03-12 ENCOUNTER — Inpatient Hospital Stay (HOSPITAL_COMMUNITY)
Admission: EM | Admit: 2011-03-12 | Discharge: 2011-03-18 | DRG: 208 | Disposition: A | Discharge status: Hospice | Payer: Medicare Other | Attending: Internal Medicine | Admitting: Internal Medicine

## 2011-03-12 ENCOUNTER — Encounter (HOSPITAL_COMMUNITY): Payer: Self-pay | Admitting: *Deleted

## 2011-03-12 ENCOUNTER — Emergency Department (HOSPITAL_COMMUNITY): Payer: Medicare Other

## 2011-03-12 DIAGNOSIS — F17200 Nicotine dependence, unspecified, uncomplicated: Secondary | ICD-10-CM

## 2011-03-12 DIAGNOSIS — R569 Unspecified convulsions: Secondary | ICD-10-CM

## 2011-03-12 DIAGNOSIS — J9600 Acute respiratory failure, unspecified whether with hypoxia or hypercapnia: Secondary | ICD-10-CM

## 2011-03-12 DIAGNOSIS — Z9861 Coronary angioplasty status: Secondary | ICD-10-CM

## 2011-03-12 DIAGNOSIS — R5381 Other malaise: Secondary | ICD-10-CM

## 2011-03-12 DIAGNOSIS — I2589 Other forms of chronic ischemic heart disease: Secondary | ICD-10-CM

## 2011-03-12 DIAGNOSIS — J449 Chronic obstructive pulmonary disease, unspecified: Secondary | ICD-10-CM | POA: Diagnosis present

## 2011-03-12 DIAGNOSIS — K219 Gastro-esophageal reflux disease without esophagitis: Secondary | ICD-10-CM

## 2011-03-12 DIAGNOSIS — I509 Heart failure, unspecified: Secondary | ICD-10-CM

## 2011-03-12 DIAGNOSIS — Z515 Encounter for palliative care: Secondary | ICD-10-CM

## 2011-03-12 DIAGNOSIS — Z7901 Long term (current) use of anticoagulants: Secondary | ICD-10-CM

## 2011-03-12 DIAGNOSIS — Z79899 Other long term (current) drug therapy: Secondary | ICD-10-CM

## 2011-03-12 DIAGNOSIS — E785 Hyperlipidemia, unspecified: Secondary | ICD-10-CM

## 2011-03-12 DIAGNOSIS — J4489 Other specified chronic obstructive pulmonary disease: Secondary | ICD-10-CM | POA: Diagnosis present

## 2011-03-12 DIAGNOSIS — Z87891 Personal history of nicotine dependence: Secondary | ICD-10-CM

## 2011-03-12 DIAGNOSIS — J9620 Acute and chronic respiratory failure, unspecified whether with hypoxia or hypercapnia: Principal | ICD-10-CM | POA: Diagnosis present

## 2011-03-12 DIAGNOSIS — R000 Tachycardia, unspecified: Secondary | ICD-10-CM

## 2011-03-12 DIAGNOSIS — I6350 Cerebral infarction due to unspecified occlusion or stenosis of unspecified cerebral artery: Secondary | ICD-10-CM

## 2011-03-12 DIAGNOSIS — Z951 Presence of aortocoronary bypass graft: Secondary | ICD-10-CM

## 2011-03-12 DIAGNOSIS — I252 Old myocardial infarction: Secondary | ICD-10-CM

## 2011-03-12 DIAGNOSIS — I2510 Atherosclerotic heart disease of native coronary artery without angina pectoris: Secondary | ICD-10-CM

## 2011-03-12 DIAGNOSIS — J189 Pneumonia, unspecified organism: Secondary | ICD-10-CM

## 2011-03-12 DIAGNOSIS — G40909 Epilepsy, unspecified, not intractable, without status epilepticus: Secondary | ICD-10-CM | POA: Diagnosis present

## 2011-03-12 DIAGNOSIS — I4891 Unspecified atrial fibrillation: Secondary | ICD-10-CM

## 2011-03-12 DIAGNOSIS — F411 Generalized anxiety disorder: Secondary | ICD-10-CM | POA: Diagnosis present

## 2011-03-12 DIAGNOSIS — I5023 Acute on chronic systolic (congestive) heart failure: Secondary | ICD-10-CM

## 2011-03-12 DIAGNOSIS — D649 Anemia, unspecified: Secondary | ICD-10-CM | POA: Diagnosis present

## 2011-03-12 DIAGNOSIS — Z66 Do not resuscitate: Secondary | ICD-10-CM | POA: Diagnosis present

## 2011-03-12 DIAGNOSIS — I447 Left bundle-branch block, unspecified: Secondary | ICD-10-CM | POA: Diagnosis present

## 2011-03-12 DIAGNOSIS — Z8673 Personal history of transient ischemic attack (TIA), and cerebral infarction without residual deficits: Secondary | ICD-10-CM

## 2011-03-12 DIAGNOSIS — Z95810 Presence of automatic (implantable) cardiac defibrillator: Secondary | ICD-10-CM

## 2011-03-12 DIAGNOSIS — R5383 Other fatigue: Secondary | ICD-10-CM

## 2011-03-12 DIAGNOSIS — Z982 Presence of cerebrospinal fluid drainage device: Secondary | ICD-10-CM

## 2011-03-12 DIAGNOSIS — R55 Syncope and collapse: Secondary | ICD-10-CM

## 2011-03-12 DIAGNOSIS — I495 Sick sinus syndrome: Secondary | ICD-10-CM

## 2011-03-12 LAB — COMPREHENSIVE METABOLIC PANEL
Albumin: 3.7 g/dL (ref 3.5–5.2)
BUN: 30 mg/dL — ABNORMAL HIGH (ref 6–23)
Chloride: 102 mEq/L (ref 96–112)
Creatinine, Ser: 0.98 mg/dL (ref 0.50–1.35)
Total Bilirubin: 0.2 mg/dL — ABNORMAL LOW (ref 0.3–1.2)

## 2011-03-12 LAB — CBC
HCT: 37 % — ABNORMAL LOW (ref 39.0–52.0)
Hemoglobin: 12.3 g/dL — ABNORMAL LOW (ref 13.0–17.0)
MCH: 31.9 pg (ref 26.0–34.0)
MCHC: 33.2 g/dL (ref 30.0–36.0)
MCV: 96.1 fL (ref 78.0–100.0)

## 2011-03-12 LAB — TROPONIN I: Troponin I: <0.3 ng/mL (ref <0.30)

## 2011-03-12 LAB — PRO B NATRIURETIC PEPTIDE: Pro B Natriuretic peptide (BNP): 5240 pg/mL — ABNORMAL HIGH (ref 0–450)

## 2011-03-12 LAB — DIFFERENTIAL
Basophils Relative: 0 % (ref 0–1)
Eosinophils Absolute: 0.2 10*3/uL (ref 0.0–0.7)
Eosinophils Relative: 1 % (ref 0–5)
Monocytes Absolute: 1.5 10*3/uL — ABNORMAL HIGH (ref 0.1–1.0)
Monocytes Relative: 9 % (ref 3–12)
Neutro Abs: 12.6 10*3/uL — ABNORMAL HIGH (ref 1.7–7.7)

## 2011-03-12 LAB — POCT I-STAT 3, ART BLOOD GAS (G3+)
O2 Saturation: 100 %
TCO2: 26 mmol/L (ref 0–100)
pCO2 arterial: 56.1 mmHg — ABNORMAL HIGH (ref 35.0–45.0)
pO2, Arterial: 293 mmHg — ABNORMAL HIGH (ref 80.0–100.0)

## 2011-03-12 MED ORDER — LORAZEPAM 2 MG/ML IJ SOLN
INTRAMUSCULAR | Status: AC
  Administered 2011-03-12: 1 mg via INTRAVENOUS
  Filled 2011-03-12: qty 1

## 2011-03-12 MED ORDER — NITROGLYCERIN 0.4 MG SL SUBL
SUBLINGUAL_TABLET | SUBLINGUAL | Status: AC
  Filled 2011-03-12: qty 25

## 2011-03-12 MED ORDER — ONDANSETRON HCL 4 MG/2ML IJ SOLN
INTRAMUSCULAR | Status: AC
  Administered 2011-03-12: 23:00:00
  Filled 2011-03-12: qty 2

## 2011-03-12 MED ORDER — ROCURONIUM BROMIDE 50 MG/5ML IV SOLN
INTRAVENOUS | Status: AC
  Filled 2011-03-12: qty 2

## 2011-03-12 MED ORDER — ALBUTEROL SULFATE (5 MG/ML) 0.5% IN NEBU
5.0000 mg | INHALATION_SOLUTION | Freq: Once | RESPIRATORY_TRACT | Status: DC
  Filled 2011-03-12: qty 1

## 2011-03-12 MED ORDER — FUROSEMIDE 10 MG/ML IJ SOLN
40.0000 mg | Freq: Once | INTRAMUSCULAR | Status: AC
  Administered 2011-03-13: 40 mg via INTRAVENOUS
  Filled 2011-03-12: qty 4

## 2011-03-12 MED ORDER — NITROGLYCERIN IN D5W 200-5 MCG/ML-% IV SOLN
2.0000 ug/min | INTRAVENOUS | Status: DC
  Administered 2011-03-12: 50 ug/min via INTRAVENOUS
  Filled 2011-03-12: qty 250

## 2011-03-12 MED ORDER — MIDAZOLAM HCL 2 MG/2ML IJ SOLN
2.0000 mg | Freq: Once | INTRAMUSCULAR | Status: AC
  Administered 2011-03-13: 2 mg via INTRAVENOUS
  Filled 2011-03-12: qty 2

## 2011-03-12 MED ORDER — LIDOCAINE HCL (CARDIAC) 20 MG/ML IV SOLN
INTRAVENOUS | Status: AC
  Filled 2011-03-12: qty 5

## 2011-03-12 MED ORDER — ALBUTEROL SULFATE HFA 108 (90 BASE) MCG/ACT IN AERS: 6.0000 | INHALATION_SPRAY | Freq: Once | RESPIRATORY_TRACT | Status: DC

## 2011-03-12 MED ORDER — SUCCINYLCHOLINE CHLORIDE 20 MG/ML IJ SOLN
INTRAMUSCULAR | Status: AC
  Administered 2011-03-12: 100 mg via INTRAVENOUS
  Filled 2011-03-12: qty 10

## 2011-03-12 MED ORDER — LORAZEPAM 2 MG/ML IJ SOLN
1.0000 mg | Freq: Once | INTRAMUSCULAR | Status: AC
  Administered 2011-03-12: 1 mg via INTRAVENOUS

## 2011-03-12 MED ORDER — NITROGLYCERIN 0.4 MG SL SUBL
0.4000 mg | SUBLINGUAL_TABLET | Freq: Once | SUBLINGUAL | Status: AC
  Administered 2011-03-12: 0.4 mg via SUBLINGUAL

## 2011-03-12 MED ORDER — ETOMIDATE 2 MG/ML IV SOLN
INTRAVENOUS | Status: AC
  Administered 2011-03-12: 30 mg
  Filled 2011-03-12: qty 20

## 2011-03-12 NOTE — ED Notes (Signed)
Interrogation of pacemaker/defib completed.

## 2011-03-12 NOTE — ED Notes (Signed)
Pt was found on floor by family.  Pt fell from standing position onto his chest.  Reports LOC at home. 325 ASA. Initial 100 sbp and 1 liter bolus given. Pt has had a syncopal episode enroute with EMS. Pt is immobilized on arrival

## 2011-03-12 NOTE — ED Notes (Signed)
CBG: 132 rn notified.

## 2011-03-12 NOTE — ED Notes (Signed)
MD at bedside. 

## 2011-03-12 NOTE — ED Provider Notes (Signed)
History     CSN: [id number]  Arrival date & time 03/12/11  2208   First MD Initiated Contact with Patient 03/12/11 2223      Chief Complaint  Patient presents with  . Loss of Consciousness  . Chest Pain    (Consider location/radiation/quality/duration/timing/severity/associated sxs/prior treatment) HPI History provided by EMS report from the RN, patient, and patient's wife.  78 year old male with a history of CAD status post CABG, AICD, and ischemic cardiomyopathy brought by EMS status post syncope. Patient reportedly has had 3 days of shortness of breath, left-sided chest pain, and cough. Patient reportedly was in the restroom today and wife noted him walking out and syncopized in front of her, falling forward onto his chest. Wife believes this episode lasted about 5-6 minutes and patient reportedly was became responsive after wife turned him over and was about to start chest compressions per 911 recommendation.  Patient reportedly was normal blood pressure the initial EMS arrival. Patient having additional syncopal episode in route with EMS; however, unknown further details regarding this event, including duration. Patient reportedly hospitalized about a month ago for pulmonary edema.  Patient does wear 2 L of O2 via nasal cannula daily.  Patient has not been seen in the last few days for his current complaints.    Past Medical History  Diagnosis Date  . Tobacco use disorder   . Esophageal reflux   . Unspecified cerebral artery occlusion with cerebral infarction   . Other and unspecified hyperlipidemia   . Dual implantable cardiac defibrillator in situ     Medtronic  . ischemic cardiomyopathy     MI in 1989 with subsequent CABG, bare-metal stent to circumflex 11/2010, ejection fraction 10-15%  . Coronary artery disease     s/p stent to the distal LCX in November 2012 - on Plavix  . Chronic systolic heart failure     EF is 10 to 15%  . Sinoatrial node dysfunction   . Atrial  fibrillation     on coumadin  . Stroke   . Angina   . COPD (chronic obstructive pulmonary disease)   . Anemia   . Heart murmur   . Shortness of breath   . Seizures   . Anxiety   . Pneumonia   . LBBB (left bundle branch block)   . MI, old     Past Surgical History  Procedure Date  . Coronary artery bypass graft   . Icd implant 2008    AICD 2008  . Cardiac catheterization 10/11/1996    REVEALS MILD TO MODERATE REDUCTION OF THE LV FUNCTION. THERE IS MARKED HYPOKINESIS OF THE ANTERIOR APEX AND INFERIOR APICAL WALL  . US echocardiography 03/24/2006    EF 15-20%  . Cardiovascular stress test 06/01/2005    EF 14 %. PREVIOUS INFERIOR LATERAL MI WITH A MARKEDLY AND HYPOCONTRACTILE LV  . Coronary stent placement 11/2010    BMS to the distal LCX    Family History  Problem Relation Age of Onset  . Heart disease Mother   . Heart disease Father     History  Substance Use Topics  . Smoking status: Former Smoker -- 1.0 packs/day for 60 years    Types: Cigarettes    Quit date: 12/30/2010  . Smokeless tobacco: Not on file   Comment: Started on nicotine patch  . Alcohol Use: No      Review of Systems  Constitutional: Negative for fever, chills and diaphoresis.  HENT: Negative for congestion, sore throat and rhinorrhea.  Eyes: Negative for pain and visual disturbance.  Respiratory: Positive for cough and shortness of breath.   Cardiovascular: Positive for chest pain. Negative for palpitations.  Gastrointestinal: Positive for nausea. Negative for vomiting, abdominal pain and diarrhea.  Genitourinary: Negative for dysuria and hematuria.  Musculoskeletal: Negative for back pain and gait problem.  Skin: Negative for rash and wound.  Neurological: Positive for syncope. Negative for dizziness and headaches.  Psychiatric/Behavioral: Negative for confusion and agitation.  All other systems reviewed and are negative.    Allergies  Valsartan  Home Medications   Current Outpatient  Rx  Name Route Sig Dispense Refill  . ALBUTEROL SULFATE HFA 108 (90 BASE) MCG/ACT IN AERS Inhalation Inhale 2 puffs into the lungs every 4 (four) hours as needed.    . ALBUTEROL SULFATE (2.5 MG/3ML) 0.083% IN NEBU Nebulization Take 2.5 mg by nebulization 4 (four) times daily as needed.    . BUDESONIDE-FORMOTEROL FUMARATE 160-4.5 MCG/ACT IN AERO Inhalation Inhale 2 puffs into the lungs 2 (two) times daily.    Marland Kitchen DIGOXIN 0.25 MG PO TABS Oral Take 0.5 tablets (125 mcg total) by mouth daily. 20 tablet 11  . FOLIVANE-PLUS PO CAPS Oral Take 1 capsule by mouth daily.      . OMEGA-3 FATTY ACIDS 1000 MG PO CAPS Oral Take 1 g by mouth 3 (three) times daily.     . FUROSEMIDE 40 MG PO TABS  Take a tablet and a half (60mg ) two times a day 270 tablet 3  . METOPROLOL TARTRATE 12.5 MG HALF TABLET Oral Take 0.5 tablets (12.5 mg total) by mouth 2 (two) times daily. 20 tablet 11  . NITROGLYCERIN 0.4 MG SL SUBL Sublingual Place 1 tablet (0.4 mg total) under the tongue every 5 (five) minutes as needed for chest pain. 25 tablet 3  . OMEPRAZOLE MAGNESIUM 20 MG PO TBEC Oral Take 20 mg by mouth daily.      Marland Kitchen PHENOBARBITAL 97.2 MG PO TABS Oral Take 48.6-97.2 mg by mouth 2 (two) times daily. Patient takes 1/2 tablet in the morning and 1 tablet in the evening    . PHENYTOIN SODIUM EXTENDED 100 MG PO CAPS Oral Take 100-200 mg by mouth 3 (three) times daily. Patient takes 1 tablet in the morning, 1 tablet at noon and 2 tablets in the evening    . POTASSIUM CHLORIDE 10 MEQ PO TBCR Oral Take 10 mEq by mouth 2 (two) times daily.     Marland Kitchen SIMVASTATIN 80 MG PO TABS Oral Take 80 mg by mouth every other day.     . WARFARIN SODIUM 5 MG PO TABS Oral Take 7.5 mg by mouth daily.      BP 120/74  Pulse 92  Temp(Src) 98.1 F (36.7 C) (Oral)  Resp 27  SpO2 87%  Physical Exam  Nursing note and vitals reviewed. Constitutional: He is oriented to person, place, and time.       Board and collar in place, mildly anxious with mild resp  distress/tachypnea  HENT:  Head: Normocephalic and atraumatic.  Right Ear: External ear normal.  Left Ear: External ear normal.  Nose: Nose normal.       NRB in place  Eyes: Conjunctivae and EOM are normal. Pupils are equal, round, and reactive to light.  Neck: Normal range of motion. Neck supple.       No post TTP or pain with ROM; thus, c-collar removed and HOB elevated  Cardiovascular: Normal rate, regular rhythm and intact distal pulses.   No  murmur heard. Pulmonary/Chest: Effort normal. No respiratory distress. He has wheezes (occasional, more noted on Rt posterior). He has rales. He exhibits no tenderness.  Abdominal: Soft. Bowel sounds are normal. He exhibits no distension. There is no tenderness.       obese  Musculoskeletal: Normal range of motion. He exhibits no edema.  Neurological: He is alert and oriented to person, place, and time.  Skin: Skin is dry. No rash noted. He is not diaphoretic.  Psychiatric: He has a normal mood and affect. Judgment normal.    ED Course  INTUBATION Performed by: Lendell Caprice Marte Celani Authorized by: Billee Cashing Consent: The procedure was performed in an emergent situation. Indications: respiratory failure and hypercapnia Intubation method: with Glidescope. Patient status: paralyzed (RSI) Preoxygenation: BVM Sedatives: etomidate Paralytic: succinylcholine Laryngoscope size: Glidescope 4. Tube size: 7.5 mm Tube type: cuffed Number of attempts: 1 Cords visualized: yes Post-procedure assessment: chest rise and ETCO2 monitor Breath sounds: equal Cuff inflated: yes ETT to lip: 24 cm Tube secured with: ETT holder Chest x-ray interpreted by me and radiologist. Chest x-ray findings: endotracheal tube in appropriate position Patient tolerance: Patient tolerated the procedure well with no immediate complications.   (including critical care time)  Labs Reviewed  CBC - Abnormal; Notable for the following:    WBC 16.7 (*)    RBC 3.85 (*)     Hemoglobin 12.3 (*)    HCT 37.0 (*)    All other components within normal limits  DIFFERENTIAL - Abnormal; Notable for the following:    Neutro Abs 12.6 (*)    Monocytes Absolute 1.5 (*)    All other components within normal limits  COMPREHENSIVE METABOLIC PANEL - Abnormal; Notable for the following:    Glucose, Bld 169 (*)    BUN 30 (*)    Alkaline Phosphatase 138 (*)    Total Bilirubin 0.2 (*)    GFR calc non Af Amer 78 (*)    All other components within normal limits  PRO B NATRIURETIC PEPTIDE - Abnormal; Notable for the following:    Pro B Natriuretic peptide (BNP) 5240.0 (*)    All other components within normal limits  PROTIME-INR - Abnormal; Notable for the following:    Prothrombin Time 24.2 (*)    INR 2.13 (*)    All other components within normal limits  DIGOXIN LEVEL - Abnormal; Notable for the following:    Digoxin Level 0.3 (*)    All other components within normal limits  POCT I-STAT 3, BLOOD GAS (G3+) - Abnormal; Notable for the following:    pH, Arterial 7.242 (*)    pCO2 arterial 56.1 (*)    pO2, Arterial 293.0 (*)    Bicarbonate 24.1 (*)    Acid-base deficit 4.0 (*)    All other components within normal limits  TROPONIN I  PHENYTOIN LEVEL, TOTAL  PHENOBARBITAL LEVEL  BLOOD GAS, ARTERIAL  CULTURE, BLOOD (ROUTINE X 2)  CULTURE, BLOOD (ROUTINE X 2)   Dg Chest Port 1 View  03/12/2011  *RADIOLOGY REPORT*  Clinical Data: Chest pain and cough for 3 days; syncope.  PORTABLE CHEST - 1 VIEW  Comparison: Chest radiograph performed 01/29/2011  Findings: Mildly worsened vascular congestion is noted, with bilateral central airspace opacities, likely reflecting mild pulmonary edema.  No pleural effusion or pneumothorax is seen.  The cardiomediastinal silhouette is enlarged; the patient is status post median sternotomy, with evidence of prior CABG.  A pacemaker/AICD is noted overlying the left chest wall, with leads ending overlying  the right atrium and right ventricle.  No  acute osseous abnormalities are identified.  IMPRESSION: Mildly worsened vascular congestion noted, with stable cardiomegaly; persistent bilateral central airspace opacities likely reflect mild pulmonary edema.  Original Report Authenticated By: Tonia Ghent, M.D.     1. Respiratory failure, acute   2. Congestive heart failure (CHF)   3. Syncope   4. Tachycardia       MDM  78 year old male with history of CHF/cardiomyopathy with AICD brought by EMS status post syncope x2 with 3 days of chest pain, shortness of breath, and cough.  No report of hypotension prior to ED arrival.  Exam is above, AF, tachycardic with tachypnea and diffuse rales with rare wheeze. The patient initially on backboard with a c-collar; however, c-collar cleared clinically secondary to no AMS, neck pain, posterior tenderness to palpation, or pain with range of motion. Patient with a Medtronic AICD; plans for interrogation. Chest x-ray ordered and consistent with mild pulmonary edema without focal consolidation/pneumonia.  Labs with sig elevated BNP, low digoxin level, NL trop and creat, and mild leukocytosis.  The patient becoming increasingly anxious and tachypneic with heart rate in the 130s.   Initial EKG with paced rhythm and repeat with a wide QRS without appreciated pacer spikes but very similar morphology.  Patient not tolerating the BiPAP mask well; thus, nasal cannula placed at 6 L. Nitroglycerin sublingual as well as drip ordered.  [facility] cardiology consult did come early and saw the pt.  Doubt PE and INR 2.13.  C/f acute CHF, possible contributed/caused by tachycardia.  Medtronic ICD reads with apparent wide-complex but noting P waves.    11:53 PM - patient with progressive respiratory decline with O2 sat in upper 80s on NRB, decreasing mental status; thus, oral and nasal airways placed and patient bagged for improved O2 sat to 96%; pt intubated as noted above the the Glidescope without difficulty. Cardiology  and critical care at bedside soon after intubation.  Repeat CXR with adequate ETT placement. Blood pressure after intubation with systolic around 99. Nitro drip DC'd.  Doubt pneumonia, although admitting doctors did order antibiotics; blood Cx's added.  Pt with increased vent settings to PEEP 10 for improved oxygenation by critical care MD.  No acute issues prior to transfer.    Particia Lather, MD 03/13/11 [phone]

## 2011-03-12 NOTE — ED Notes (Signed)
MD at bedside RT at bedside to set up BIPAP

## 2011-03-13 ENCOUNTER — Emergency Department (HOSPITAL_COMMUNITY): Payer: Medicare Other

## 2011-03-13 DIAGNOSIS — I428 Other cardiomyopathies: Secondary | ICD-10-CM

## 2011-03-13 DIAGNOSIS — R55 Syncope and collapse: Secondary | ICD-10-CM

## 2011-03-13 DIAGNOSIS — R0602 Shortness of breath: Secondary | ICD-10-CM

## 2011-03-13 DIAGNOSIS — J189 Pneumonia, unspecified organism: Secondary | ICD-10-CM | POA: Diagnosis present

## 2011-03-13 DIAGNOSIS — I509 Heart failure, unspecified: Secondary | ICD-10-CM

## 2011-03-13 LAB — BASIC METABOLIC PANEL
BUN: 31 mg/dL — ABNORMAL HIGH (ref 6–23)
Calcium: 7.9 mg/dL — ABNORMAL LOW (ref 8.4–10.5)
Creatinine, Ser: 1.02 mg/dL (ref 0.50–1.35)
GFR calc non Af Amer: 70 mL/min — ABNORMAL LOW (ref >90)
Glucose, Bld: 126 mg/dL — ABNORMAL HIGH (ref 70–99)

## 2011-03-13 LAB — BLOOD GAS, ARTERIAL
Bicarbonate: 25.8 mEq/L — ABNORMAL HIGH (ref 20.0–24.0)
PEEP: 5 cmH2O
Patient temperature: 98.6
pH, Arterial: 7.364 (ref 7.350–7.450)
pO2, Arterial: 89.1 mmHg (ref 80.0–100.0)

## 2011-03-13 LAB — CBC
HCT: 36.8 % — ABNORMAL LOW (ref 39.0–52.0)
Hemoglobin: 12.2 g/dL — ABNORMAL LOW (ref 13.0–17.0)
MCH: 31.8 pg (ref 26.0–34.0)
MCHC: 33.2 g/dL (ref 30.0–36.0)
MCV: 95.8 fL (ref 78.0–100.0)

## 2011-03-13 LAB — DIGOXIN LEVEL: Digoxin Level: 0.3 ng/mL — ABNORMAL LOW (ref 0.8–2.0)

## 2011-03-13 LAB — POCT I-STAT 3, ART BLOOD GAS (G3+)
O2 Saturation: 93 %
TCO2: 26 mmol/L (ref 0–100)
pCO2 arterial: 51.1 mmHg — ABNORMAL HIGH (ref 35.0–45.0)
pO2, Arterial: 75 mmHg — ABNORMAL LOW (ref 80.0–100.0)

## 2011-03-13 LAB — MRSA PCR SCREENING: MRSA by PCR: NEGATIVE

## 2011-03-13 LAB — PROTIME-INR: Prothrombin Time: 25.7 seconds — ABNORMAL HIGH (ref 11.6–15.2)

## 2011-03-13 MED ORDER — SODIUM CHLORIDE 0.9 % IV SOLN
50.0000 ug/h | INTRAVENOUS | Status: DC
  Administered 2011-03-13: 50 ug/h via INTRAVENOUS
  Filled 2011-03-13: qty 50

## 2011-03-13 MED ORDER — FREE WATER
100.0000 mL | Freq: Three times a day (TID) | Status: DC
  Administered 2011-03-13 – 2011-03-14 (×3): 100 mL

## 2011-03-13 MED ORDER — ROSUVASTATIN CALCIUM 20 MG PO TABS
20.0000 mg | ORAL_TABLET | Freq: Every day | ORAL | Status: DC
  Administered 2011-03-13 – 2011-03-16 (×2): 20 mg via ORAL
  Filled 2011-03-13 (×4): qty 1

## 2011-03-13 MED ORDER — PHENYTOIN SODIUM EXTENDED 100 MG PO CAPS
200.0000 mg | ORAL_CAPSULE | Freq: Every day | ORAL | Status: DC
  Filled 2011-03-13: qty 2

## 2011-03-13 MED ORDER — PHENOBARBITAL 97.2 MG PO TABS
97.2000 mg | ORAL_TABLET | Freq: Every day | ORAL | Status: DC
  Administered 2011-03-13: 97.2 mg via ORAL
  Filled 2011-03-13 (×2): qty 1

## 2011-03-13 MED ORDER — PHENYTOIN SODIUM EXTENDED 100 MG PO CAPS
100.0000 mg | ORAL_CAPSULE | Freq: Two times a day (BID) | ORAL | Status: DC
  Filled 2011-03-13 (×3): qty 1

## 2011-03-13 MED ORDER — PIPERACILLIN-TAZOBACTAM 3.375 G IVPB
3.3750 g | Freq: Three times a day (TID) | INTRAVENOUS | Status: DC
  Administered 2011-03-13 – 2011-03-16 (×11): 3.375 g via INTRAVENOUS
  Filled 2011-03-13 (×13): qty 50

## 2011-03-13 MED ORDER — METOPROLOL TARTRATE 12.5 MG HALF TABLET
12.5000 mg | ORAL_TABLET | Freq: Two times a day (BID) | ORAL | Status: DC
  Administered 2011-03-13 (×2): 12.5 mg via ORAL
  Filled 2011-03-13 (×5): qty 1

## 2011-03-13 MED ORDER — VANCOMYCIN HCL IN DEXTROSE 1-5 GM/200ML-% IV SOLN
1000.0000 mg | Freq: Two times a day (BID) | INTRAVENOUS | Status: DC
  Filled 2011-03-13: qty 200

## 2011-03-13 MED ORDER — ACETAMINOPHEN 160 MG/5ML PO SOLN
650.0000 mg | Freq: Four times a day (QID) | ORAL | Status: DC | PRN
  Administered 2011-03-13 – 2011-03-16 (×5): 650 mg
  Filled 2011-03-13 (×4): qty 20.3

## 2011-03-13 MED ORDER — PHENYTOIN 125 MG/5ML PO SUSP
200.0000 mg | Freq: Two times a day (BID) | ORAL | Status: DC
  Administered 2011-03-13 (×2): 200 mg
  Filled 2011-03-13 (×4): qty 8

## 2011-03-13 MED ORDER — PROPOFOL 10 MG/ML IV EMUL
5.0000 ug/kg/min | INTRAVENOUS | Status: DC
  Administered 2011-03-13: 5 ug/kg/min via INTRAVENOUS
  Filled 2011-03-13: qty 100

## 2011-03-13 MED ORDER — PHENOBARBITAL 97.2 MG PO TABS: 48.6000 mg | ORAL_TABLET | Freq: Two times a day (BID) | ORAL | Status: DC

## 2011-03-13 MED ORDER — SODIUM CHLORIDE 0.9 % IV SOLN
250.0000 mL | INTRAVENOUS | Status: DC | PRN
  Administered 2011-03-13: 1000 mL via INTRAVENOUS

## 2011-03-13 MED ORDER — FUROSEMIDE 10 MG/ML IJ SOLN
40.0000 mg | Freq: Four times a day (QID) | INTRAMUSCULAR | Status: AC
  Administered 2011-03-13 (×3): 40 mg via INTRAVENOUS
  Filled 2011-03-13 (×5): qty 4

## 2011-03-13 MED ORDER — PHENOBARBITAL 97.2 MG PO TABS: 48.6000 mg | ORAL_TABLET | Freq: Every day | ORAL | Status: DC

## 2011-03-13 MED ORDER — POTASSIUM CHLORIDE 20 MEQ/15ML (10%) PO LIQD
40.0000 meq | Freq: Once | ORAL | Status: AC
  Administered 2011-03-13: 40 meq
  Filled 2011-03-13: qty 30

## 2011-03-13 MED ORDER — ALBUTEROL SULFATE (5 MG/ML) 0.5% IN NEBU: 2.5000 mg | INHALATION_SOLUTION | RESPIRATORY_TRACT | Status: DC | PRN

## 2011-03-13 MED ORDER — PANTOPRAZOLE SODIUM 40 MG IV SOLR
40.0000 mg | Freq: Every day | INTRAVENOUS | Status: DC
  Administered 2011-03-13 – 2011-03-15 (×3): 40 mg via INTRAVENOUS
  Filled 2011-03-13 (×4): qty 40

## 2011-03-13 MED ORDER — VANCOMYCIN HCL IN DEXTROSE 1-5 GM/200ML-% IV SOLN
1000.0000 mg | Freq: Once | INTRAVENOUS | Status: AC
  Administered 2011-03-13: 1000 mg via INTRAVENOUS

## 2011-03-13 MED ORDER — OSMOLITE 1.5 CAL PO LIQD
1000.0000 mL | ORAL | Status: DC
  Administered 2011-03-13: 1000 mL via ORAL
  Filled 2011-03-13 (×2): qty 1000

## 2011-03-13 MED ORDER — BUDESONIDE-FORMOTEROL FUMARATE 160-4.5 MCG/ACT IN AERO
2.0000 | INHALATION_SPRAY | Freq: Two times a day (BID) | RESPIRATORY_TRACT | Status: DC
  Administered 2011-03-13 – 2011-03-16 (×7): 2 via RESPIRATORY_TRACT
  Filled 2011-03-13: qty 6

## 2011-03-13 MED ORDER — FENTANYL CITRATE 0.05 MG/ML IJ SOLN
50.0000 ug | Freq: Once | INTRAMUSCULAR | Status: AC
  Administered 2011-03-13: 50 ug via INTRAVENOUS
  Filled 2011-03-13: qty 2

## 2011-03-13 MED ORDER — PROPOFOL 10 MG/ML IV EMUL
5.0000 ug/kg/min | INTRAVENOUS | Status: DC
  Filled 2011-03-13: qty 20

## 2011-03-13 MED ORDER — PHENYTOIN SODIUM EXTENDED 100 MG PO CAPS: 100.0000 mg | ORAL_CAPSULE | Freq: Three times a day (TID) | ORAL | Status: DC

## 2011-03-13 MED ORDER — PHENOBARBITAL 16.2 MG PO TABS: 48.6000 mg | ORAL_TABLET | Freq: Every day | ORAL | Status: DC

## 2011-03-13 MED ORDER — PHENOBARBITAL 97.2 MG PO TABS
48.6000 mg | ORAL_TABLET | Freq: Every day | ORAL | Status: DC
  Administered 2011-03-13: 48.6 mg via ORAL

## 2011-03-13 MED ORDER — BIOTENE DRY MOUTH MT LIQD
15.0000 mL | Freq: Four times a day (QID) | OROMUCOSAL | Status: DC
  Administered 2011-03-13 – 2011-03-16 (×14): 15 mL via OROMUCOSAL

## 2011-03-13 MED ORDER — FENTANYL BOLUS VIA INFUSION
50.0000 ug | Freq: Four times a day (QID) | INTRAVENOUS | Status: DC | PRN
  Filled 2011-03-13: qty 100

## 2011-03-13 MED ORDER — CHLORHEXIDINE GLUCONATE 0.12 % MT SOLN
15.0000 mL | Freq: Two times a day (BID) | OROMUCOSAL | Status: DC
  Administered 2011-03-13 – 2011-03-17 (×10): 15 mL via OROMUCOSAL
  Filled 2011-03-13 (×13): qty 15

## 2011-03-13 MED ORDER — WARFARIN SODIUM 7.5 MG PO TABS
7.5000 mg | ORAL_TABLET | Freq: Every day | ORAL | Status: DC
Start: 2011-03-13 — End: 2011-03-14
  Administered 2011-03-13: 7.5 mg via ORAL
  Filled 2011-03-13 (×2): qty 1

## 2011-03-13 MED ORDER — PIPERACILLIN-TAZOBACTAM 3.375 G IVPB 30 MIN
3.3750 g | Freq: Once | INTRAVENOUS | Status: DC
  Filled 2011-03-13: qty 50

## 2011-03-13 MED ORDER — MIDAZOLAM HCL 2 MG/2ML IJ SOLN
INTRAMUSCULAR | Status: AC
  Filled 2011-03-13: qty 2

## 2011-03-13 MED ORDER — DIGOXIN 0.25 MG/ML IJ SOLN
0.2500 mg | Freq: Every day | INTRAMUSCULAR | Status: DC
  Administered 2011-03-13 – 2011-03-16 (×4): 0.25 mg via INTRAVENOUS
  Filled 2011-03-13 (×5): qty 1

## 2011-03-13 NOTE — H&P (Signed)
Patient name: [name] Medical record number: [id number] Date of birth: December 21, 1933 Age: 78 y.o. Gender: male PCP: Josue Hector, MD, MD  PCCM ADMISSION NOTE  Date: 03/13/2011  History of Present Illness: [name] is a 78 year old gentleman who presented to the ED after 2 syncopal events. He has a past medical history significant for chronic systolic congestive heart failure, EF 10%, and was found down by his family earlier this evening. EMS was called and responded and brought into the emergency department. She had an additional syncopal episode in the ambulance en route. He was initially hypoxic, and was tried on BiPAP, however quickly became somnolent and was subsequently intubated. Immediately following intubation a large amount of frothy secretions were suctioned from his ET tube. His chest x-ray demonstrated diffuse bilateral interstitial markings some more focal infiltrate in the right middle lobe obscuring the right cardiac border.  Lines/tubes : NG/OG Tube Orogastric Left mouth (Active)     Urethral Catheter 14 Fr. (Active)    Microbiology/Sepsis markers: Results for orders placed during the hospital encounter of 01/29/11  URINE CULTURE     Status: Normal   Collection Time   01/30/11  1:03 AM      Component Value Range Status Comment   Specimen Description URINE, CATHETERIZED   Final    Special Requests NONE   Final    Culture  Setup Time [id number]   Final    Colony Count NO GROWTH   Final    Culture NO GROWTH   Final    Report Status 01/31/2011 FINAL   Final   MRSA PCR SCREENING     Status: Normal   Collection Time   01/30/11  9:38 PM      Component Value Range Status Comment   MRSA by PCR NEGATIVE  NEGATIVE  Final     Anti-infectives:  Anti-infectives     Start     Dose/Rate Route Frequency Ordered Stop   03/13/11 0030   vancomycin (VANCOCIN) IVPB 1000 mg/200 mL premix        1,000 mg 200 mL/hr over 60 Minutes Intravenous  Once 03/13/11 0023     03/13/11  0030  piperacillin-tazobactam (ZOSYN) IVPB 3.375 g       3.375 g 100 mL/hr over 30 Minutes Intravenous  Once 03/13/11 0023            Best Practice/Protocols:  Warfarin for DVT Protonix for GI Continous Sedation  Consults: Cardiology   Studies: Dg Chest Port 1 View  03/12/2011  *RADIOLOGY REPORT*  Clinical Data: Chest pain and cough for 3 days; syncope.  PORTABLE CHEST - 1 VIEW  Comparison: Chest radiograph performed 01/29/2011  Findings: Mildly worsened vascular congestion is noted, with bilateral central airspace opacities, likely reflecting mild pulmonary edema.  No pleural effusion or pneumothorax is seen.  The cardiomediastinal silhouette is enlarged; the patient is status post median sternotomy, with evidence of prior CABG.  A pacemaker/AICD is noted overlying the left chest wall, with leads ending overlying the right atrium and right ventricle.  No acute osseous abnormalities are identified.  IMPRESSION: Mildly worsened vascular congestion noted, with stable cardiomegaly; persistent bilateral central airspace opacities likely reflect mild pulmonary edema.  Original Report Authenticated By: Tonia Ghent, M.D.     Events:   Past Medical History  Diagnosis Date  . Tobacco use disorder   . Esophageal reflux   . Unspecified cerebral artery occlusion with cerebral infarction   . Other and unspecified hyperlipidemia   . Dual implantable  cardiac defibrillator in situ     Medtronic  . ischemic cardiomyopathy     MI in 1989 with subsequent CABG, bare-metal stent to circumflex 11/2010, ejection fraction 10-15%  . Coronary artery disease     s/p stent to the distal LCX in November 2012 - on Plavix  . Chronic systolic heart failure     EF is 10 to 15%  . Sinoatrial node dysfunction   . Atrial fibrillation     on coumadin  . Stroke   . Angina   . COPD (chronic obstructive pulmonary disease)   . Anemia   . Heart murmur   . Shortness of breath   . Seizures   . Anxiety   .  Pneumonia   . LBBB (left bundle branch block)   . MI, old     Past Surgical History  Procedure Date  . Coronary artery bypass graft   . Icd implant 2008    AICD 2008  . Cardiac catheterization 10/11/1996    REVEALS MILD TO MODERATE REDUCTION OF THE LV FUNCTION. THERE IS MARKED HYPOKINESIS OF THE ANTERIOR APEX AND INFERIOR APICAL WALL  . US echocardiography 03/24/2006    EF 15-20%  . Cardiovascular stress test 06/01/2005    EF 14 %. PREVIOUS INFERIOR LATERAL MI WITH A MARKEDLY AND HYPOCONTRACTILE LV  . Coronary stent placement 11/2010    BMS to the distal LCX    Family History  Problem Relation Age of Onset  . Heart disease Mother   . Heart disease Father     Social History:  reports that he quit smoking about 2 months ago. His smoking use included Cigarettes. He has a 60 pack-year smoking history. He does not have any smokeless tobacco history on file. He reports that he does not drink alcohol or use illicit drugs.  Allergies:  Allergies  Allergen Reactions  . Valsartan     Medications:  Prior to Admission medications   Medication Sig Start Date End Date Taking? Authorizing Provider  albuterol (PROVENTIL HFA;VENTOLIN HFA) 108 (90 BASE) MCG/ACT inhaler Inhale 2 puffs into the lungs every 4 (four) hours as needed.    Historical Provider, MD  albuterol (PROVENTIL) (2.5 MG/3ML) 0.083% nebulizer solution Take 2.5 mg by nebulization 4 (four) times daily as needed.    Historical Provider, MD  budesonide-formoterol (SYMBICORT) 160-4.5 MCG/ACT inhaler Inhale 2 puffs into the lungs 2 (two) times daily.    Historical Provider, MD  digoxin (LANOXIN) 0.25 MG tablet Take 0.5 tablets (125 mcg total) by mouth daily. 02/05/11   Darrol Jump, PA  FeFum-FePoly-FA-B Cmp-C-Biot (FOLIVANE-PLUS) CAPS Take 1 capsule by mouth daily.      Historical Provider, MD  fish oil-omega-3 fatty acids 1000 MG capsule Take 1 g by mouth 3 (three) times daily.     Historical Provider, MD  furosemide (LASIX) 40  MG tablet Take a tablet and a half (60mg ) two times a day 03/08/11   Rosalio Macadamia, NP  metoprolol tartrate (LOPRESSOR) 12.5 mg TABS Take 0.5 tablets (12.5 mg total) by mouth 2 (two) times daily. 02/05/11   Darrol Jump, PA  nitroGLYCERIN (NITROSTAT) 0.4 MG SL tablet Place 1 tablet (0.4 mg total) under the tongue every 5 (five) minutes as needed for chest pain. 01/12/11 01/12/12  Elyn Aquas., MD  omeprazole (PRILOSEC OTC) 20 MG tablet Take 20 mg by mouth daily.      Historical Provider, MD  PHENobarbital (LUMINAL) 97.2 MG tablet Take 48.6-97.2 mg by mouth 2 (  two) times daily. Patient takes 1/2 tablet in the morning and 1 tablet in the evening    Historical Provider, MD  phenytoin (DILANTIN) 100 MG ER capsule Take 100-200 mg by mouth 3 (three) times daily. Patient takes 1 tablet in the morning, 1 tablet at noon and 2 tablets in the evening    Historical Provider, MD  potassium chloride (KLOR-CON) 10 MEQ CR tablet Take 10 mEq by mouth 2 (two) times daily.     Historical Provider, MD  simvastatin (ZOCOR) 80 MG tablet Take 80 mg by mouth every other day.     Historical Provider, MD  warfarin (COUMADIN) 5 MG tablet Take 7.5 mg by mouth daily.    Historical Provider, MD    Review of systems not obtained due to patient factors.  Vital signs for last 24 hours: Temp:  [98.1 F (36.7 C)] 98.1 F (36.7 C) (02/15 2211) Pulse Rate:  [92-102] 92  (02/15 2218) Resp:  [16-27] 16  (02/16 0043) BP: (109-120)/(68-74) 109/71 mmHg (02/16 0043) SpO2:  [87 %-98 %] 90 % (02/16 0043) Weight:  [69.854 kg (154 lb)] 69.854 kg (154 lb) (02/15 2300)  Hemodynamic parameters for last 24 hours:    Intake/Output from previous day:    Intake/Output this shift:    Vent settings for last 24 hours:    Physical Exam:  General: No apparent distress. Eyes: Anicteric sclerae. ENT: Oropharynx clear. Moist mucous membranes. No thrush Lymph: No cervical, supraclavicular, or axillary lymphadenopathy. Heart:  Quiet heart sounds. Lungs: Coarse crackles bilaterally with prolonged expiratory phase and wheezing. Abdomen: Abdomen soft, non-tender and not distended, normoactive bowel sounds. No hepatosplenomegaly or masses. Musculoskeletal: No clubbing or synovitis. Skin: No rashes or lesions Neuro: No focal neurologic deficits.  LAB RESULT Results for orders placed during the hospital encounter of 03/12/11 (from the past 24 hour(s))  CBC     Status: Abnormal   Collection Time   03/12/11 10:45 PM      Component Value Range   WBC 16.7 (*) 4.0 - 10.5 (K/uL)   RBC 3.85 (*) 4.22 - 5.81 (MIL/uL)   Hemoglobin 12.3 (*) 13.0 - 17.0 (g/dL)   HCT 40.9 (*) 81.1 - 52.0 (%)   MCV 96.1  78.0 - 100.0 (fL)   MCH 31.9  26.0 - 34.0 (pg)   MCHC 33.2  30.0 - 36.0 (g/dL)   RDW 91.4  78.2 - 95.6 (%)   Platelets 196  150 - 400 (K/uL)  DIFFERENTIAL     Status: Abnormal   Collection Time   03/12/11 10:45 PM      Component Value Range   Neutrophils Relative 75  43 - 77 (%)   Neutro Abs 12.6 (*) 1.7 - 7.7 (K/uL)   Lymphocytes Relative 15  12 - 46 (%)   Lymphs Abs 2.5  0.7 - 4.0 (K/uL)   Monocytes Relative 9  3 - 12 (%)   Monocytes Absolute 1.5 (*) 0.1 - 1.0 (K/uL)   Eosinophils Relative 1  0 - 5 (%)   Eosinophils Absolute 0.2  0.0 - 0.7 (K/uL)   Basophils Relative 0  0 - 1 (%)   Basophils Absolute 0.0  0.0 - 0.1 (K/uL)  COMPREHENSIVE METABOLIC PANEL     Status: Abnormal   Collection Time   03/12/11 10:45 PM      Component Value Range   Sodium 138  135 - 145 (mEq/L)   Potassium 4.7  3.5 - 5.1 (mEq/L)   Chloride 102  96 - 112 (  mEq/L)   CO2 25  19 - 32 (mEq/L)   Glucose, Bld 169 (*) 70 - 99 (mg/dL)   BUN 30 (*) 6 - 23 (mg/dL)   Creatinine, Ser 1.61  0.50 - 1.35 (mg/dL)   Calcium 8.8  8.4 - 09.6 (mg/dL)   Total Protein 7.2  6.0 - 8.3 (g/dL)   Albumin 3.7  3.5 - 5.2 (g/dL)   AST 30  0 - 37 (U/L)   ALT 29  0 - 53 (U/L)   Alkaline Phosphatase 138 (*) 39 - 117 (U/L)   Total Bilirubin 0.2 (*) 0.3 - 1.2 (mg/dL)    GFR calc non Af Amer 78 (*) >90 (mL/min)   GFR calc Af Amer >90  >90 (mL/min)  TROPONIN I     Status: Normal   Collection Time   03/12/11 10:45 PM      Component Value Range   Troponin I <0.30  <0.30 (ng/mL)  PRO B NATRIURETIC PEPTIDE     Status: Abnormal   Collection Time   03/12/11 10:45 PM      Component Value Range   Pro B Natriuretic peptide (BNP) 5240.0 (*) 0 - 450 (pg/mL)  PROTIME-INR     Status: Abnormal   Collection Time   03/12/11 10:45 PM      Component Value Range   Prothrombin Time 24.2 (*) 11.6 - 15.2 (seconds)   INR 2.13 (*) 0.00 - 1.49   DIGOXIN LEVEL     Status: Abnormal   Collection Time   03/12/11 11:12 PM      Component Value Range   Digoxin Level 0.3 (*) 0.8 - 2.0 (ng/mL)  PHENYTOIN LEVEL, TOTAL     Status: Normal   Collection Time   03/12/11 11:12 PM      Component Value Range   Phenytoin Lvl 10.5  10.0 - 20.0 (ug/mL)  PHENOBARBITAL LEVEL     Status: Normal   Collection Time   03/12/11 11:12 PM      Component Value Range   Phenobarbital 35.0  15.0 - 40.0 (ug/mL)  POCT I-STAT 3, BLOOD GAS (G3+)     Status: Abnormal   Collection Time   03/12/11 11:30 PM      Component Value Range   pH, Arterial 7.242 (*) 7.350 - 7.450    pCO2 arterial 56.1 (*) 35.0 - 45.0 (mmHg)   pO2, Arterial 293.0 (*) 80.0 - 100.0 (mmHg)   Bicarbonate 24.1 (*) 20.0 - 24.0 (mEq/L)   TCO2 26  0 - 100 (mmol/L)   O2 Saturation 100.0     Acid-base deficit 4.0 (*) 0.0 - 2.0 (mmol/L)   Collection site RADIAL, ALLEN'S TEST ACCEPTABLE     Drawn by Operator     Sample type ARTERIAL       Assessment/Plan Acute Respiratory Failure (due to pulmonary infiltrates) --Requiring a high degree of PEEP to oxygenate. Likely due to combination of heart failure and pneumonia.  --Propofol and fentanyl for sedation. --Albuterol and ipratropium nebulizers --Will likely need to be extubated to BiPAP given hypercapnia  Acute Pulmonary Edema (due to left ventricular dysfunction), Cardiomyopathy (ischemic)  and Congestive Heart Failure (systolic) --Definitely has evidence for acute exacerbation of chronic heart failure. Will diurese aggressively and continue his beta blocker. Appreciate cardiology's input. Will obtain echocardiogram in the morning and will interrogate pacemaker/AICD.  Pneumonia (health-care associated) --Was recently hospitalized in January therefore this is healthcare associated pneumonia. Covering with vancomycin and Zosyn dosed per pharmacy. Cultures will be obtained in the emergency  department.    LOS: 1 day   Best practices / Disposition: -->ICU status under PCCM -->Full Code -->Warfarin for DVT Px -->Protonix for GI Px -->ventilator bundle -->diet -NPO -->No family available  The patient is critically ill with multiple organ systems failure and requires high complexity decision making for assessment and support, frequent evaluation and titration of therapies, application of advanced monitoring technologies and extensive interpretation of multiple databases.   Critical Care Time devoted to patient care services described in this note is: 1 Hour  Lavella Hammock, M.D. Pulmonary and Critical Care Medicine Call E-link with questions [phone]  03/13/2011

## 2011-03-13 NOTE — Consult Note (Signed)
Reason for Consult: CHF exacerbation  Referring Physician: Dr. Maisie Fus (Pulmonary Critical Care)  Primary Cardiologist: Dr. Elease Hashimoto  Primary Care Physician: Mellody Memos FP  [name] is an 78 y.o. male.   HPI: 78 y/o male with a PMH of HTN, CAD, Ischemic cardiomyopathy, NYHA class III, LVEF 10-15% presenting with acute shortness of breath and syncope.  Patient was intubated due to acute respiratory distress prior to my arrival to ER, thus most of the history was obtained from ER physician and medical staff.  He reportedly had an episode of shortness of breath and syncope.  On arrival, his heart rate was about 130 bpm and he was in acute respiratory distress requiring intubation.  His initial ECG (22:11:54) showed sinus rhythm with ventricular paced rhythm (rate 99 bpm) , PVCs and non-specific ST-T wave changes.  His post intubation chest X-ray was consistent with pulmonary edema.    He has CAD s/p CABG s/p cardiac cath 11/2010: BMS to LCx, LVEF 10-15%, patent LIMA-->LAD, patent SVG-->OM, RCA small non-dominant.  2-D Echo 01/31/2011: LVEF 15%, mild to moderate mitral regurgitation, mildly dilated RV with mild RV systolic dysfunction  Past Medical History  Diagnosis Date  . Tobacco use disorder   . Esophageal reflux   . Unspecified cerebral artery occlusion with cerebral infarction   . Other and unspecified hyperlipidemia   . Dual implantable cardiac defibrillator in situ     [employer]tronic  . ischemic cardiomyopathy     MI in 1989 with subsequent CABG, bare-metal stent to circumflex 11/2010, ejection fraction 10-15%  . Coronary artery disease     s/p stent to the distal LCX in November 2012 - on Plavix  . Chronic systolic heart failure     EF is 10 to 15%  . Sinoatrial node dysfunction   . Atrial fibrillation     on coumadin  . Stroke   . Angina   . COPD (chronic obstructive pulmonary disease)   . Anemia   . Heart murmur   . Shortness of breath   . Seizures   . Anxiety   .  Pneumonia   . LBBB (left bundle branch block)   . MI, old     Past Surgical History  Procedure Date  . Coronary artery bypass graft   . Icd implant 2008    AICD 2008  . Cardiac catheterization 10/11/1996    REVEALS MILD TO MODERATE REDUCTION OF THE LV FUNCTION. THERE IS MARKED HYPOKINESIS OF THE ANTERIOR APEX AND INFERIOR APICAL WALL  . US echocardiography 03/24/2006    EF 15-20%  . Cardiovascular stress test 06/01/2005    EF 14 %. PREVIOUS INFERIOR LATERAL MI WITH A MARKEDLY AND HYPOCONTRACTILE LV  . Coronary stent placement 11/2010    BMS to the distal LCX    Family History  Problem Relation Age of Onset  . Heart disease Mother   . Heart disease Father     Social History:  reports that he quit smoking about 2 months ago. His smoking use included Cigarettes. He has a 60 pack-year smoking history. He does not have any smokeless tobacco history on file. He reports that he does not drink alcohol or use illicit drugs.  Allergies:  Allergies  Allergen Reactions  . Valsartan     Cardiac Meds  1. Carvedilol 12.5 mg bid 2. Plavix 75 mg q day 3. Digoxin 0.25 mg q day 4. Lisinopril 2.5 mg q day 5. Coumadin 5 mg qhs  Results for orders placed during the hospital  encounter of 03/12/11 (from the past 48 hour(s))  CBC     Status: Abnormal   Collection Time   03/12/11 10:45 PM      Component Value Range Comment   WBC 16.7 (*) 4.0 - 10.5 (K/uL)    RBC 3.85 (*) 4.22 - 5.81 (MIL/uL)    Hemoglobin 12.3 (*) 13.0 - 17.0 (g/dL)    HCT 16.1 (*) 09.6 - 52.0 (%)    MCV 96.1  78.0 - 100.0 (fL)    MCH 31.9  26.0 - 34.0 (pg)    MCHC 33.2  30.0 - 36.0 (g/dL)    RDW 04.5  40.9 - 81.1 (%)    Platelets 196  150 - 400 (K/uL)   DIFFERENTIAL     Status: Abnormal   Collection Time   03/12/11 10:45 PM      Component Value Range Comment   Neutrophils Relative 75  43 - 77 (%)    Neutro Abs 12.6 (*) 1.7 - 7.7 (K/uL)    Lymphocytes Relative 15  12 - 46 (%)    Lymphs Abs 2.5  0.7 - 4.0 (K/uL)     Monocytes Relative 9  3 - 12 (%)    Monocytes Absolute 1.5 (*) 0.1 - 1.0 (K/uL)    Eosinophils Relative 1  0 - 5 (%)    Eosinophils Absolute 0.2  0.0 - 0.7 (K/uL)    Basophils Relative 0  0 - 1 (%)    Basophils Absolute 0.0  0.0 - 0.1 (K/uL)   COMPREHENSIVE METABOLIC PANEL     Status: Abnormal   Collection Time   03/12/11 10:45 PM      Component Value Range Comment   Sodium 138  135 - 145 (mEq/L)    Potassium 4.7  3.5 - 5.1 (mEq/L)    Chloride 102  96 - 112 (mEq/L)    CO2 25  19 - 32 (mEq/L)    Glucose, Bld 169 (*) 70 - 99 (mg/dL)    BUN 30 (*) 6 - 23 (mg/dL)    Creatinine, Ser 9.14  0.50 - 1.35 (mg/dL)    Calcium 8.8  8.4 - 10.5 (mg/dL)    Total Protein 7.2  6.0 - 8.3 (g/dL)    Albumin 3.7  3.5 - 5.2 (g/dL)    AST 30  0 - 37 (U/L)    ALT 29  0 - 53 (U/L)    Alkaline Phosphatase 138 (*) 39 - 117 (U/L)    Total Bilirubin 0.2 (*) 0.3 - 1.2 (mg/dL)    GFR calc non Af Amer 78 (*) >90 (mL/min)    GFR calc Af Amer >90  >90 (mL/min)   TROPONIN I     Status: Normal   Collection Time   03/12/11 10:45 PM      Component Value Range Comment   Troponin I <0.30  <0.30 (ng/mL)   PRO B NATRIURETIC PEPTIDE     Status: Abnormal   Collection Time   03/12/11 10:45 PM      Component Value Range Comment   Pro B Natriuretic peptide (BNP) 5240.0 (*) 0 - 450 (pg/mL)   PROTIME-INR     Status: Abnormal   Collection Time   03/12/11 10:45 PM      Component Value Range Comment   Prothrombin Time 24.2 (*) 11.6 - 15.2 (seconds)    INR 2.13 (*) 0.00 - 1.49    POCT I-STAT 3, BLOOD GAS (G3+)     Status: Abnormal   Collection Time  03/12/11 11:30 PM      Component Value Range Comment   pH, Arterial 7.242 (*) 7.350 - 7.450     pCO2 arterial 56.1 (*) 35.0 - 45.0 (mmHg)    pO2, Arterial 293.0 (*) 80.0 - 100.0 (mmHg)    Bicarbonate 24.1 (*) 20.0 - 24.0 (mEq/L)    TCO2 26  0 - 100 (mmol/L)    O2 Saturation 100.0      Acid-base deficit 4.0 (*) 0.0 - 2.0 (mmol/L)    Collection site RADIAL, ALLEN'S TEST  ACCEPTABLE      Drawn by Operator      Sample type ARTERIAL       Dg Chest Port 1 View  03/12/2011  *RADIOLOGY REPORT*  Clinical Data: Chest pain and cough for 3 days; syncope.  PORTABLE CHEST - 1 VIEW  Comparison: Chest radiograph performed 01/29/2011  Findings: Mildly worsened vascular congestion is noted, with bilateral central airspace opacities, likely reflecting mild pulmonary edema.  No pleural effusion or pneumothorax is seen.  The cardiomediastinal silhouette is enlarged; the patient is status post median sternotomy, with evidence of prior CABG.  A pacemaker/AICD is noted overlying the left chest wall, with leads ending overlying the right atrium and right ventricle.  No acute osseous abnormalities are identified.  IMPRESSION: Mildly worsened vascular congestion noted, with stable cardiomegaly; persistent bilateral central airspace opacities likely reflect mild pulmonary edema.  Original Report Authenticated By: Tonia Ghent, M.D.    Review of Systems  Unable to perform ROS: severe respiratory distress   Blood pressure 120/74, pulse 92, temperature 98.1 F (36.7 C), temperature source Oral, resp. rate 27, SpO2 87.00%. Physical Exam  Constitutional: He appears well-developed and well-nourished.  HENT:  Head: Normocephalic and atraumatic.  Eyes: Pupils are equal, round, and reactive to light.  Neck: JVD present. No tracheal deviation present. No thyromegaly present.  Cardiovascular: Regular rhythm.  Exam reveals no friction rub.   Murmur (Holosystolic murmur at left sternal burder radiating to axilla) heard. Respiratory: Effort normal. He has no wheezes. He has rales. He exhibits no tenderness.  GI: Soft. Bowel sounds are normal. He exhibits no distension. There is no tenderness. There is no rebound and no guarding.  Musculoskeletal: He exhibits no edema and no tenderness.  Neurological: He exhibits normal muscle tone.       Unable to fully evaluate due to patient unresponsive.    Skin: Skin is warm and dry. No rash noted. No erythema.    Assessment:  1. CHF exacerbation, acute on chronic 2. Ischemic cardiomyopathy, NYHA class III, LVEF 10-15% 3. CAD s/p BMS to LCx 11/2010  4. COPD on home oxygen 5. Seizure disorder 6. Syncope  Plan  Patient is currently been admitted by pulmonary/critical care service. Regarding his CHF exacerbation, he will need to be diuresed with Lasix since he is in pulmonary edema.  He may be restarted on his home Digoxin, Lisinopril and Carvedilol as long as he is not hypotensive. We will optimize his CHF medication once he is diuresed. I will recommend checking his Digoxin levels and obtaining a 2-D echocardiogram in the morning to re-evaluate his left and right ventricular systolic function.  Since there was a report of possible syncope, his ICD will need to be interrogated. May obtain cardiac markers and BNP level.   [name] 03/13/2011, 12:04 AM

## 2011-03-13 NOTE — Progress Notes (Signed)
ANTICOAGULATION CONSULT NOTE - Initial Consult  Pharmacy Consult for warfarin Indication: atrial fibrillation  Allergies  Allergen Reactions  . Valsartan     Patient Measurements: Height: 5\' 8"  (172.7 cm) Weight: 149 lb 4 oz (67.7 kg) IBW/kg (Calculated) : 68.4    Vital Signs: Temp: 100.9 F (38.3 C) (02/16 0700) Temp src: Axillary (02/16 0400) BP: 99/61 mmHg (02/16 0809) Pulse Rate: 79  (02/16 0700)  Labs:  Basename 03/13/11 0530 03/12/11 2245  HGB 12.2* 12.3*  HCT 36.8* 37.0*  PLT 186 196  APTT -- --  LABPROT 25.7* 24.2*  INR 2.30* 2.13*  HEPARINUNFRC -- --  CREATININE 1.02 0.98  CKTOTAL -- --  CKMB -- --  TROPONINI -- <0.30   Estimated Creatinine Clearance: 59.9 ml/min (by C-G formula based on Cr of 1.02).  Medical History: Past Medical History  Diagnosis Date  . Tobacco use disorder   . Esophageal reflux   . Unspecified cerebral artery occlusion with cerebral infarction   . Other and unspecified hyperlipidemia   . Dual implantable cardiac defibrillator in situ     Medtronic  . ischemic cardiomyopathy     MI in 1989 with subsequent CABG, bare-metal stent to circumflex 11/2010, ejection fraction 10-15%  . Coronary artery disease     s/p stent to the distal LCX in November 2012 - on Plavix  . Chronic systolic heart failure     EF is 10 to 15%  . Sinoatrial node dysfunction   . Atrial fibrillation     on coumadin  . Stroke   . Angina   . COPD (chronic obstructive pulmonary disease)   . Anemia   . Heart murmur   . Shortness of breath   . Seizures   . Anxiety   . Pneumonia   . LBBB (left bundle branch block)   . MI, old     Medications:  Prescriptions prior to admission  Medication Sig Dispense Refill  . warfarin (COUMADIN) 5 MG tablet Take 7.5 mg by mouth daily.      Marland Kitchen albuterol (PROVENTIL HFA;VENTOLIN HFA) 108 (90 BASE) MCG/ACT inhaler Inhale 2 puffs into the lungs every 4 (four) hours as needed.      Marland Kitchen albuterol (PROVENTIL) (2.5 MG/3ML)  0.083% nebulizer solution Take 2.5 mg by nebulization 4 (four) times daily as needed.      . budesonide-formoterol (SYMBICORT) 160-4.5 MCG/ACT inhaler Inhale 2 puffs into the lungs 2 (two) times daily.      . digoxin (LANOXIN) 0.25 MG tablet Take 0.5 tablets (125 mcg total) by mouth daily.  20 tablet  11  . FeFum-FePoly-FA-B Cmp-C-Biot (FOLIVANE-PLUS) CAPS Take 1 capsule by mouth daily.        . fish oil-omega-3 fatty acids 1000 MG capsule Take 1 g by mouth 3 (three) times daily.       . furosemide (LASIX) 40 MG tablet Take a tablet and a half (60mg ) two times a day  270 tablet  3  . metoprolol tartrate (LOPRESSOR) 12.5 mg TABS Take 0.5 tablets (12.5 mg total) by mouth 2 (two) times daily.  20 tablet  11  . nitroGLYCERIN (NITROSTAT) 0.4 MG SL tablet Place 1 tablet (0.4 mg total) under the tongue every 5 (five) minutes as needed for chest pain.  25 tablet  3  . omeprazole (PRILOSEC OTC) 20 MG tablet Take 20 mg by mouth daily.        Marland Kitchen PHENobarbital (LUMINAL) 97.2 MG tablet Take 48.6-97.2 mg by mouth 2 (two) times daily.  Patient takes 1/2 tablet in the morning and 1 tablet in the evening      . phenytoin (DILANTIN) 100 MG ER capsule Take 100-200 mg by mouth 3 (three) times daily. Patient takes 1 tablet in the morning, 1 tablet at noon and 2 tablets in the evening      . potassium chloride (KLOR-CON) 10 MEQ CR tablet Take 10 mEq by mouth 2 (two) times daily.       . simvastatin (ZOCOR) 80 MG tablet Take 80 mg by mouth every other day.         Assessment: 78 yo male admitted with syncope at home, now intubated. He is on chronic warfarin for afib. INR therapeutic at 2.3, no issues noted. Goal of Therapy:  INR 2-3   Plan:  Continue home dose of warfarin 7.5mg  daily. INR daily  Severiano Gilbert 03/13/2011,10:33 AM

## 2011-03-13 NOTE — ED Notes (Signed)
Pt failed BIPAP, MD is at bedside at this time for intubation. Pt family brought to waiting room

## 2011-03-13 NOTE — Progress Notes (Signed)
ANTIBIOTIC CONSULT NOTE - INITIAL  Pharmacy Consult for Vancocin/Zosyn Indication: rule out pneumonia  Allergies  Allergen Reactions  . Valsartan     Patient Measurements: Height: 5\' 8"  (172.7 cm) Weight: 154 lb (69.854 kg) IBW/kg (Calculated) : 68.4   Vital Signs: Temp: 98.1 F (36.7 C) (02/15 2211) Temp src: Oral (02/15 2211) BP: 120/74 mmHg (02/15 2218) Pulse Rate: 92  (02/15 2218)  Labs:  Basename 03/12/11 2245  WBC 16.7*  HGB 12.3*  PLT 196  LABCREA --  CREATININE 0.98   Estimated Creatinine Clearance: 63 ml/min (by C-G formula based on Cr of 0.98).  Microbiology: No results found for this or any previous visit (from the past 720 hour(s)).  Medical History: Past Medical History  Diagnosis Date  . Tobacco use disorder   . Esophageal reflux   . Unspecified cerebral artery occlusion with cerebral infarction   . Other and unspecified hyperlipidemia   . Dual implantable cardiac defibrillator in situ     Medtronic  . ischemic cardiomyopathy     MI in 1989 with subsequent CABG, bare-metal stent to circumflex 11/2010, ejection fraction 10-15%  . Coronary artery disease     s/p stent to the distal LCX in November 2012 - on Plavix  . Chronic systolic heart failure     EF is 10 to 15%  . Sinoatrial node dysfunction   . Atrial fibrillation     on coumadin  . Stroke   . Angina   . COPD (chronic obstructive pulmonary disease)   . Anemia   . Heart murmur   . Shortness of breath   . Seizures   . Anxiety   . Pneumonia   . LBBB (left bundle branch block)   . MI, old    Assessment: 78yo male experienced syncopal episodes at home and en route w/ EMS, pacer interrogated in ED, being intubated, to begin IV ABX for possible PNA.  Goal of Therapy:  Vancomycin trough level 15-20 mcg/ml  Plan:  Will begin vancomycin 1000mg  IV Q12H and Zosyn 3.375g IV Q8H and monitor CBC, Cx, levels prn.  Colleen Can PharmD BCPS 03/13/2011,12:24 AM

## 2011-03-13 NOTE — Progress Notes (Signed)
Patient [name], 78 year old white male resident of [place], [place] arrived EMS with heart challenges.  Chaplain sat with patient's wife Tamela Oddi in CDU Family Room 17 providing pastoral prayer, presence, and conversation.  Patient's wife expressed appreciation for Chaplain's presence.  I will follow-up as needed.

## 2011-03-13 NOTE — Progress Notes (Signed)
INITIAL ADULT NUTRITION ASSESSMENT Date: 03/13/2011   Time: 1:04 PM Reason for Assessment: New TF  ASSESSMENT: Male 78 y.o.  Dx: Acute on chronic systolic heart failure  Hx:  Past Medical History  Diagnosis Date  . Tobacco use disorder   . Esophageal reflux   . Unspecified cerebral artery occlusion with cerebral infarction   . Other and unspecified hyperlipidemia   . Dual implantable cardiac defibrillator in situ     Medtronic  . ischemic cardiomyopathy     MI in 1989 with subsequent CABG, bare-metal stent to circumflex 11/2010, ejection fraction 10-15%  . Coronary artery disease     s/p stent to the distal LCX in November 2012 - on Plavix  . Chronic systolic heart failure     EF is 10 to 15%  . Sinoatrial node dysfunction   . Atrial fibrillation     on coumadin  . Stroke   . Angina   . COPD (chronic obstructive pulmonary disease)   . Anemia   . Heart murmur   . Shortness of breath   . Seizures   . Anxiety   . Pneumonia   . LBBB (left bundle branch block)   . MI, old    Related Meds:     . antiseptic oral rinse  15 mL Mouth Rinse QID  . budesonide-formoterol  2 puff Inhalation BID  . chlorhexidine  15 mL Mouth Rinse BID  . digoxin  0.25 mg Intravenous Daily  . etomidate      . fentaNYL  50 mcg Intravenous Once  . free water  100 mL Per Tube Q8H  . furosemide  40 mg Intravenous Once  . furosemide  40 mg Intravenous Q6H  . lidocaine (cardiac) 100 mg/72ml      . LORazepam  1 mg Intravenous Once  . metoprolol tartrate  12.5 mg Oral BID  . midazolam      . midazolam  2 mg Intravenous Once  . nitroGLYCERIN  0.4 mg Sublingual Once  . ondansetron      . pantoprazole (PROTONIX) IV  40 mg Intravenous QHS  . phenobarbital  48.6 mg Oral Daily  . PHENObarbital  97.2 mg Oral QHS  . phenytoin  200 mg Per Tube BID  . piperacillin-tazobactam (ZOSYN)  IV  3.375 g Intravenous Q8H  . potassium chloride  40 mEq Per Tube Once  . potassium chloride  40 mEq Per Tube Once  .  rocuronium      . rosuvastatin  20 mg Oral Daily  . succinylcholine      . vancomycin  1,000 mg Intravenous Once  . warfarin  7.5 mg Oral q1800  . DISCONTD: albuterol  6 puff Inhalation Once  . DISCONTD: albuterol  5 mg Nebulization Once  . DISCONTD: PHENobarbital  48.6 mg Oral Daily  . DISCONTD: PHENobarbital  48.6 mg Oral Daily  . DISCONTD: PHENobarbital  48.6-97.2 mg Oral BID  . DISCONTD: phenytoin  100 mg Oral BID WC  . DISCONTD: phenytoin  100-200 mg Oral TID  . DISCONTD: phenytoin  200 mg Oral QHS  . DISCONTD: piperacillin-tazobactam  3.375 g Intravenous Once  . DISCONTD: vancomycin  1,000 mg Intravenous Q12H     Ht: 5\' 8"  (172.7 cm)  Wt: 149 lb 4 oz (67.7 kg)  Ideal Wt:   70 kg % Ideal Wt: 97%  Usual Wt:  Wt Readings from Last 3 Encounters:  03/13/11 149 lb 4 oz (67.7 kg)  03/08/11 154 lb (69.854 kg)  02/24/11  154 lb (69.854 kg)    % Usual Wt: 97%  Body mass index is 22.69 kg/(m^2). WNL  Food/Nutrition Related Hx: No nutrition problems indicated at admission per nutrition risk assessment  Labs:  CMP     Component Value Date/Time   NA 140 03/13/2011 0530   K 3.7 03/13/2011 0530   CL 102 03/13/2011 0530   CO2 25 03/13/2011 0530   GLUCOSE 126* 03/13/2011 0530   BUN 31* 03/13/2011 0530   CREATININE 1.02 03/13/2011 0530   CALCIUM 7.9* 03/13/2011 0530   PROT 7.2 03/12/2011 2245   ALBUMIN 3.7 03/12/2011 2245   AST 30 03/12/2011 2245   ALT 29 03/12/2011 2245   ALKPHOS 138* 03/12/2011 2245   BILITOT 0.2* 03/12/2011 2245   GFRNONAA 70* 03/13/2011 0530   GFRAA 81* 03/13/2011 0530    Intake/Output Summary (Last 24 hours) at 03/13/11 1308 Last data filed at 03/13/11 1100  Gross per 24 hour  Intake 524.32 ml  Output   1600 ml  Net -1075.68 ml     Diet Order:  NONE pt is intubated  Supplements/Tube Feeding: Osmolite 1.5 @ 30 ml/hr  IVF:    feeding supplement (OSMOLITE 1.5 CAL) Last Rate: 1,000 mL (03/13/11 1237)  fentaNYL infusion INTRAVENOUS Last Rate: 50 mcg/hr  (03/13/11 0815)  propofol   propofol Last Rate: 5 mcg/kg/min (03/13/11 0815)  DISCONTD: nitroGLYCERIN Last Rate: Stopped (03/12/11 2357)    Estimated Nutritional Needs:   Kcal:1900-2100 Protein: 95-105 gm Fluid: 1.9 - 2.1 L  Patient is intubated, unable to provide any information. EN was initiated today 2/16, Osmolite 1.5 at a rate of 30 ml/hr. This does not meet estimated nutrition needs. Recommend increasing rate to 55 ml/hr.   NUTRITION DIAGNOSIS: -Inadequate oral intake (NI-2.1).  Status: Ongoing  RELATED TO: inability to eat 2/2 to vent  AS EVIDENCE BY: no diet, initiation of EN  MONITORING/EVALUATION(Goals): Goal: EN will provide >90% of estimated nutrition needs Monitor: EN rate/tolerance, labs, weight, I/O's  EDUCATION NEEDS: -No education needs identified at this time  INTERVENTION: 1. Recommend increasing Osmolite 1.5 to a goal rate of 55 ml/hr to provide 1980 kcal and 82 gm protein (100% of minimum estimated kcal needs and 86% minimum estimated protein needs).  2. Add pro-stat once daily to meet >90% of estimated protein needs with EN of Osmolite 1.5 at goal of 55 ml/hr  Dietitian #[phone]  DOCUMENTATION CODES Per approved criteria  -Not Applicable    [name] 03/13/2011, 1:04 PM

## 2011-03-13 NOTE — ED Notes (Signed)
Pt conditioning worsening with increasing work of breathing. MD notified of the same. RT called to bedside for BIPAP.

## 2011-03-13 NOTE — ED Notes (Signed)
PT wife is betsy [phone]

## 2011-03-14 ENCOUNTER — Inpatient Hospital Stay (HOSPITAL_COMMUNITY): Payer: Medicare Other

## 2011-03-14 LAB — CBC
Hemoglobin: 10.7 g/dL — ABNORMAL LOW (ref 13.0–17.0)
Platelets: 165 10*3/uL (ref 150–400)
RBC: 3.39 MIL/uL — ABNORMAL LOW (ref 4.22–5.81)

## 2011-03-14 LAB — PRO B NATRIURETIC PEPTIDE: Pro B Natriuretic peptide (BNP): 6222 pg/mL — ABNORMAL HIGH (ref 0–450)

## 2011-03-14 LAB — BASIC METABOLIC PANEL
Calcium: 8.3 mg/dL — ABNORMAL LOW (ref 8.4–10.5)
GFR calc Af Amer: >90 mL/min (ref >90)
GFR calc non Af Amer: 78 mL/min — ABNORMAL LOW (ref >90)
Potassium: 4.3 mEq/L (ref 3.5–5.1)
Sodium: 139 mEq/L (ref 135–145)

## 2011-03-14 LAB — PROTIME-INR
INR: 3.13 — ABNORMAL HIGH (ref 0.00–1.49)
Prothrombin Time: 32.7 seconds — ABNORMAL HIGH (ref 11.6–15.2)

## 2011-03-14 LAB — GLUCOSE, CAPILLARY: Glucose-Capillary: 132 mg/dL — ABNORMAL HIGH (ref 70–99)

## 2011-03-14 LAB — PROCALCITONIN: Procalcitonin: 2.63 ng/mL

## 2011-03-14 MED ORDER — POTASSIUM CHLORIDE 20 MEQ/15ML (10%) PO LIQD
20.0000 meq | Freq: Once | ORAL | Status: AC
  Administered 2011-03-14: 20 meq

## 2011-03-14 MED ORDER — PHENOBARBITAL SODIUM 65 MG/ML IJ SOLN
48.6000 mg | Freq: Every day | INTRAMUSCULAR | Status: DC
  Administered 2011-03-14 – 2011-03-16 (×3): 48.6 mg via INTRAVENOUS
  Filled 2011-03-14 (×3): qty 1

## 2011-03-14 MED ORDER — SODIUM CHLORIDE 0.9 % IV SOLN
200.0000 mg | Freq: Every day | INTRAVENOUS | Status: DC
  Administered 2011-03-14 – 2011-03-15 (×2): 200 mg via INTRAVENOUS
  Filled 2011-03-14 (×4): qty 4

## 2011-03-14 MED ORDER — METOPROLOL TARTRATE 1 MG/ML IV SOLN
2.5000 mg | Freq: Four times a day (QID) | INTRAVENOUS | Status: DC
  Administered 2011-03-14 – 2011-03-16 (×9): 2.5 mg via INTRAVENOUS
  Filled 2011-03-14 (×13): qty 5

## 2011-03-14 MED ORDER — POTASSIUM CHLORIDE 20 MEQ/15ML (10%) PO LIQD
ORAL | Status: AC
  Administered 2011-03-14: 20 meq
  Filled 2011-03-14: qty 15

## 2011-03-14 MED ORDER — PHENOBARBITAL SODIUM 130 MG/ML IJ SOLN
97.2000 mg | Freq: Every day | INTRAMUSCULAR | Status: DC
  Administered 2011-03-14 – 2011-03-15 (×2): 97.2 mg via INTRAVENOUS
  Filled 2011-03-14 (×2): qty 2

## 2011-03-14 MED ORDER — PHENYTOIN SODIUM 50 MG/ML IJ SOLN
100.0000 mg | Freq: Every day | INTRAMUSCULAR | Status: DC
  Administered 2011-03-15 – 2011-03-16 (×2): 100 mg via INTRAVENOUS
  Filled 2011-03-14 (×4): qty 2

## 2011-03-14 MED ORDER — METOPROLOL TARTRATE 1 MG/ML IV SOLN
2.5000 mg | INTRAVENOUS | Status: DC | PRN
  Filled 2011-03-14 (×2): qty 5

## 2011-03-14 MED ORDER — PHENYTOIN SODIUM 50 MG/ML IJ SOLN
100.0000 mg | Freq: Every day | INTRAMUSCULAR | Status: DC
  Administered 2011-03-14 – 2011-03-15 (×2): 100 mg via INTRAVENOUS
  Filled 2011-03-14 (×3): qty 2

## 2011-03-14 MED ORDER — LEVALBUTEROL HCL 0.63 MG/3ML IN NEBU
0.6300 mg | INHALATION_SOLUTION | Freq: Four times a day (QID) | RESPIRATORY_TRACT | Status: DC | PRN
  Administered 2011-03-15: 0.63 mg via RESPIRATORY_TRACT
  Filled 2011-03-14: qty 3

## 2011-03-14 MED ORDER — FENTANYL CITRATE 0.05 MG/ML IJ SOLN
12.5000 ug | INTRAMUSCULAR | Status: DC | PRN
  Administered 2011-03-14 – 2011-03-15 (×5): 25 ug via INTRAVENOUS
  Filled 2011-03-14 (×5): qty 2

## 2011-03-14 MED ORDER — FUROSEMIDE 10 MG/ML IJ SOLN
40.0000 mg | Freq: Once | INTRAMUSCULAR | Status: AC
  Administered 2011-03-14: 40 mg via INTRAVENOUS

## 2011-03-14 NOTE — Progress Notes (Signed)
Pt profile: 77 yowm with severe ischemic CM (EF 10%) who presented to [facility] ED via EMS on 2/16 with recurrent syncope and was intubated in ED after failing NPPV attempt for hypoxic respiratory failure. Adm dx was pulmonary edema +/- PNA. PMH also includes longstanding seizure d/o  Lines, Tubes, etc: ETT 2/16 >> 2/17   Microbiology: MRSA PCR >> NEG Blood X 2 2/16 >>   Antibiotics:  Vanc 2/16 >> 2/16 Zosyn 2/16 >>   Studies/Events:   Consults:  L[facility] Cards 2/16 >>   Best Practice: DVT: full dose warfarin SUP: PPI Nutrition: NPO Glycemic control: not needed     Subj: Jerking episodes this AM - RN raises concern for seizure though he has had none in yrs on his current regimen and was on propofol during one of these episodes. Presently cognition intact. + F/C. Passed SBT >> extubated 2/17 AM and looks good  Obj: Filed Vitals:   03/14/11 1000  BP: 114/62  Pulse: 109  Temp: 100.8 F (38.2 C)  Resp: 16    Gen: NAD HEENT: WNL Neck: JVP not visualized Chest: Clear anteriorly Cardiac: irreg, + syst M Abd: NABS, soft Ext:  No edema  BMET    Component Value Date/Time   NA 139 03/14/2011 0526   K 4.3 03/14/2011 0526   CL 103 03/14/2011 0526   CO2 26 03/14/2011 0526   GLUCOSE 179* 03/14/2011 0526   BUN 33* 03/14/2011 0526   CREATININE 0.99 03/14/2011 0526   CALCIUM 8.3* 03/14/2011 0526   GFRNONAA 78* 03/14/2011 0526   GFRAA >90 03/14/2011 0526    CBC    Component Value Date/Time   WBC 13.5* 03/14/2011 0526   RBC 3.39* 03/14/2011 0526   HGB 10.7* 03/14/2011 0526   HCT 32.9* 03/14/2011 0526   PLT 165 03/14/2011 0526   MCV 97.1 03/14/2011 0526   MCH 31.6 03/14/2011 0526   MCHC 32.5 03/14/2011 0526   RDW 14.7 03/14/2011 0526   LYMPHSABS 2.5 03/12/2011 2245   MONOABS 1.5* 03/12/2011 2245   EOSABS 0.2 03/12/2011 2245   BASOSABS 0.0 03/12/2011 2245   PCT: 2/63  CXR: Improved edema pattern   IMPRESSION: 1) Acute on chronic respiratory failure due to exacerbation of CHF with  acute pulmonary edema +/- PNA. Clinical picture is most suggestive of pulmonary edema but mildly elevated PCT worrisome for possibility of active infection as well. -Repeat Lasix 2/17 -Passed SBT > extubated 2/17. Tolerating well early on but relatively high FiO2 reqts. Monitor closely post-extubation. Good candidate for NPPV if worsens  2) End Stage Cardiomyopathy - [facility] Cards following.  -Low dose metoprolol ordered -Daughter indicates that he has Living Will and would not wish to undergo ACLS/CPR. I have made NCB in event of cardiac arrest. I have not specifically addressed whether short term re-intubation would be desired if he fails extubation. This needs to be addressed  3) Seizure d/o - concern for seizure raised by RN AM 2/17. He has been continued on AEDs and was also on propofol. Exhibited no evidence of post-ictal state.  -Change AEDs to IV while NPO post extubation    30 mins CCM time  Billy Fischer, MD;  PCCM service; Mobile [phone]

## 2011-03-14 NOTE — Progress Notes (Signed)
eLink Physician-Brief Progress Note Patient Name: [name] DOB: 08-20-1933 MRN: [id number]  Date of Service  03/14/2011   HPI/Events of Note  Nurse informed that patient has waxing and waning episodes of consciousness. Had another episode 15 minutes back. No response to painful stimuli. Vitals stable. Pupils reactive. Breathing over the ventilator.   eICU Interventions  Will do CT head to rule out intra-cranial bleed.      Catha Brow 03/14/2011, 3:03 AM

## 2011-03-14 NOTE — Progress Notes (Signed)
Subjective:  Improved clinically. To be extubated this am per CCM.  Objective:  Vital Signs in the last 24 hours: Temp:  [99.5 F (37.5 C)-101.3 F (38.5 C)] 100.6 F (38.1 C) (02/17 0800) Pulse Rate:  [74-111] 111  (02/17 0800) Resp:  [11-17] 16  (02/17 0800) BP: (85-119)/(51-68) 119/65 mmHg (02/17 0800) SpO2:  [93 %-99 %] 96 % (02/17 0800) FiO2 (%):  [40 %-60.2 %] 49.6 % (02/17 0800) Weight:  [160 lb 4.4 oz (72.7 kg)] 160 lb 4.4 oz (72.7 kg) (02/17 0300) Alert and responds to verbal stimulus appropriately. Intake/Output from previous day: 02/16 0701 - 02/17 0700 In: 816.7 [I.V.:581.7; NG/GT:127; IV Piggyback:108] Out: 1695 [Urine:1695] Intake/Output from this shift: Total I/O In: 54 [IV Piggyback:54] Out: -   Physical Exam: Intubated. Minimal JVD. Cor- RRR, sinus tach with V pacing, Lungs- relatively clear except decreased breath sounds in left base, Abdomen soft and nondistended, Extremities with no edema.  Lab Results:  Basename 03/14/11 0526 03/13/11 0530  WBC 13.5* 22.1*  HGB 10.7* 12.2*  PLT 165 186    Basename 03/14/11 0526 03/13/11 0530  NA 139 140  K 4.3 3.7  CL 103 102  CO2 26 25  GLUCOSE 179* 126*  BUN 33* 31*  CREATININE 0.99 1.02    Basename 03/12/11 2245  TROPONINI <0.30   Hepatic Function Panel  Basename 03/12/11 2245  PROT 7.2  ALBUMIN 3.7  AST 30  ALT 29  ALKPHOS 138*  BILITOT 0.2*  BILIDIR --  IBILI --   No results found for this basename: CHOL in the last 72 hours No results found for this basename: PROTIME in the last 72 hours  Imaging:   Cardiac Studies:  Assessment/Plan: Acute on Chronic Systolic CHF Ready to be extubated. Discussed with Dr Bard Herbert and family. Lasix ordered this am. Resume outpatient po meds including lisinopril and carvedilol when extubated and more stable.  LOS: 2 days    Valera Castle 03/14/2011, 9:05 AM

## 2011-03-14 NOTE — Progress Notes (Signed)
eLink Physician-Brief Progress Note Patient Name: [name] DOB: 1933-06-27 MRN: [id number]  Date of Service  03/14/2011   HPI/Events of Note  Nurse just informed, patient awake again, following commands. Possible seizure ?? Patient already on phenobarb and phenytoin.   eICU Interventions  Will d/c CT head.      Catha Brow 03/14/2011, 3:07 AM

## 2011-03-14 NOTE — Telephone Encounter (Signed)
Thanks so much.  Ignore my result message looking for the CBC.

## 2011-03-14 NOTE — Plan of Care (Addendum)
I wasted 1200 mcg of fentanyl in the sink which remained following discontinuation of patient's sedation protocol.  [name] 1:41 PM 03/14/2011 Verified- Sherlyn Lees

## 2011-03-14 NOTE — Progress Notes (Signed)
ANTICOAGULATION CONSULT NOTE - Initial Consult  Pharmacy Consult for warfarin/dilantin/phenobarbital Indication: atrial fibrillation/seizure  Allergies  Allergen Reactions  . Valsartan Other (See Comments)    Wife doesn't remember, possible sick to stomach    Patient Measurements: Height: 5\' 8"  (172.7 cm) Weight: 160 lb 4.4 oz (72.7 kg) IBW/kg (Calculated) : 68.4    Vital Signs: Temp: 100.6 F (38.1 C) (02/17 0800) BP: 119/65 mmHg (02/17 0800) Pulse Rate: 111  (02/17 0800)  Labs:  Basename 03/14/11 0526 03/13/11 0530 03/12/11 2245  HGB 10.7* 12.2* --  HCT 32.9* 36.8* 37.0*  PLT 165 186 196  APTT -- -- --  LABPROT 32.7* 25.7* 24.2*  INR 3.13* 2.30* 2.13*  HEPARINUNFRC -- -- --  CREATININE 0.99 1.02 0.98  CKTOTAL -- -- --  CKMB -- -- --  TROPONINI -- -- <0.30   Estimated Creatinine Clearance: 62.4 ml/min (by C-G formula based on Cr of 0.99).  Medical History: Past Medical History  Diagnosis Date  . Tobacco use disorder   . Esophageal reflux   . Unspecified cerebral artery occlusion with cerebral infarction   . Other and unspecified hyperlipidemia   . Dual implantable cardiac defibrillator in situ     Medtronic  . ischemic cardiomyopathy     MI in 1989 with subsequent CABG, bare-metal stent to circumflex 11/2010, ejection fraction 10-15%  . Coronary artery disease     s/p stent to the distal LCX in November 2012 - on Plavix  . Chronic systolic heart failure     EF is 10 to 15%  . Sinoatrial node dysfunction   . Atrial fibrillation     on coumadin  . Stroke   . Angina   . COPD (chronic obstructive pulmonary disease)   . Anemia   . Heart murmur   . Shortness of breath   . Seizures   . Anxiety   . Pneumonia   . LBBB (left bundle branch block)   . MI, old     Medications:  Prescriptions prior to admission  Medication Sig Dispense Refill  . albuterol (PROVENTIL HFA;VENTOLIN HFA) 108 (90 BASE) MCG/ACT inhaler Inhale 2 puffs into the lungs every 4  (four) hours as needed.      Marland Kitchen albuterol (PROVENTIL) (2.5 MG/3ML) 0.083% nebulizer solution Take 2.5 mg by nebulization 4 (four) times daily as needed.      . budesonide-formoterol (SYMBICORT) 160-4.5 MCG/ACT inhaler Inhale 2 puffs into the lungs 2 (two) times daily.      . digoxin (LANOXIN) 0.25 MG tablet Take 0.5 tablets (125 mcg total) by mouth daily.  20 tablet  11  . FeFum-FePoly-FA-B Cmp-C-Biot (FOLIVANE-PLUS) CAPS Take 1 capsule by mouth daily.        . fish oil-omega-3 fatty acids 1000 MG capsule Take 1 g by mouth 3 (three) times daily.       . furosemide (LASIX) 40 MG tablet Take a tablet and a half (60mg ) two times a day  270 tablet  3  . metoprolol tartrate (LOPRESSOR) 12.5 mg TABS Take 0.5 tablets (12.5 mg total) by mouth 2 (two) times daily.  20 tablet  11  . nitroGLYCERIN (NITROSTAT) 0.4 MG SL tablet Place 1 tablet (0.4 mg total) under the tongue every 5 (five) minutes as needed for chest pain.  25 tablet  3  . omeprazole (PRILOSEC OTC) 20 MG tablet Take 20 mg by mouth daily.        Marland Kitchen PHENobarbital (LUMINAL) 97.2 MG tablet Take 48.6-97.2 mg by mouth 2 (  two) times daily. Patient takes 1/2 tablet in the morning and 1 tablet in the evening      . phenytoin (DILANTIN) 100 MG ER capsule Take 100-200 mg by mouth 3 (three) times daily. Patient takes 1 tablet in the morning, 1 tablet at noon and 2 tablets in the evening      . potassium chloride (KLOR-CON) 10 MEQ CR tablet Take 10 mEq by mouth 2 (two) times daily.       . simvastatin (ZOCOR) 80 MG tablet Take 80 mg by mouth every other day.       . warfarin (COUMADIN) 5 MG tablet Take 7.5 mg by mouth daily.        Assessment: 78 yo male admitted with syncope at home, now intubated. He is on chronic warfarin for afib. INR therapeutic at 2.3, no issues noted. Goal of Therapy:  INR 2-3   Plan:  No coumadin today INR daily Resume dilantin/phenobarbital home dose but give IV  Ulyses Southward [place] 03/14/2011,9:16 AM

## 2011-03-14 NOTE — Progress Notes (Signed)
Patient is now following commands and responding to painful stimuli. MD notified; Ct canceled. Will continue to monitor.

## 2011-03-14 NOTE — Procedures (Signed)
Extubation Procedure Note  Patient Details:   Name: [name] DOB: [birth year] MRN: [id number]   Airway Documentation:  AIRWAYS 7.5 mm (Active)  Secured at (cm) 25 cm 03/12/2011 12:00 AM     Airway 7.5 mm (Active)  Secured at (cm) 24 cm 03/14/2011  7:58 AM  Measured From Lips 03/14/2011  7:58 AM  Secured Location Right 03/14/2011  7:58 AM  Secured By [employer] 03/14/2011  7:58 AM  Tube Holder Repositioned Yes 03/14/2011  7:58 AM  Cuff Pressure (cm H2O) 24 cm H2O 03/14/2011  4:00 AM  Site Condition Dry 03/14/2011  7:58 AM   Evaluation  O2 sats: stable throughout Complications: No apparent complications Patient did tolerate procedure well. Bilateral Breath Sounds: Diminished Suctioning: Airway Yes Pt extubated per MD order.  Pt placed on 100% NRB for better gas exchange.  Sats 94%.  Pt is doing well.  Will continue to monitor.  Closson, Terie Purser 03/14/2011, 9:23 AM

## 2011-03-14 NOTE — Progress Notes (Signed)
Patient appeared agitated and pulling at ET tube, pt suddenly loses consciousness and is no longer following commands or responding to painful stimuli. MD was notified; stat CT ordered.

## 2011-03-14 NOTE — Plan of Care (Signed)
Problem: Phase I Progression Outcomes Goal: Voiding-avoid urinary catheter unless indicated Outcome: Completed/Met Date Met:  03/14/11 Foley removed 2/17  Problem: Phase II Progression Outcomes Goal: Date pt extubated/weaned off vent Outcome: Completed/Met Date Met:  03/14/11 Weaned/Extubated 2/17 Goal: Time pt extubated/weaned off vent Outcome: Completed/Met Date Met:  03/14/11 0920 - Extubated

## 2011-03-15 ENCOUNTER — Inpatient Hospital Stay (HOSPITAL_COMMUNITY): Payer: Medicare Other

## 2011-03-15 DIAGNOSIS — J810 Acute pulmonary edema: Secondary | ICD-10-CM

## 2011-03-15 DIAGNOSIS — J9600 Acute respiratory failure, unspecified whether with hypoxia or hypercapnia: Secondary | ICD-10-CM

## 2011-03-15 DIAGNOSIS — I2510 Atherosclerotic heart disease of native coronary artery without angina pectoris: Secondary | ICD-10-CM

## 2011-03-15 LAB — PROTIME-INR
INR: 2.96 — ABNORMAL HIGH (ref 0.00–1.49)
Prothrombin Time: 31.3 seconds — ABNORMAL HIGH (ref 11.6–15.2)

## 2011-03-15 LAB — CBC
Hemoglobin: 10.2 g/dL — ABNORMAL LOW (ref 13.0–17.0)
MCHC: 32.3 g/dL (ref 30.0–36.0)
Platelets: 161 10*3/uL (ref 150–400)
RDW: 14.6 % (ref 11.5–15.5)

## 2011-03-15 LAB — BASIC METABOLIC PANEL
GFR calc Af Amer: >90 mL/min (ref >90)
GFR calc non Af Amer: 80 mL/min — ABNORMAL LOW (ref >90)
Potassium: 3.8 mEq/L (ref 3.5–5.1)
Sodium: 142 mEq/L (ref 135–145)

## 2011-03-15 LAB — PRO B NATRIURETIC PEPTIDE: Pro B Natriuretic peptide (BNP): 7648 pg/mL — ABNORMAL HIGH (ref 0–450)

## 2011-03-15 MED ORDER — SODIUM CHLORIDE 0.9 % IV SOLN
1.0000 mg/h | INTRAVENOUS | Status: DC
  Filled 2011-03-15: qty 6.67

## 2011-03-15 MED ORDER — FUROSEMIDE 10 MG/ML IJ SOLN
40.0000 mg | Freq: Three times a day (TID) | INTRAMUSCULAR | Status: DC
  Filled 2011-03-15 (×3): qty 4

## 2011-03-15 MED ORDER — MORPHINE SULFATE 25 MG/ML IV SOLN
1.0000 mg/h | INTRAVENOUS | Status: DC
  Administered 2011-03-15: 1 mg/h via INTRAVENOUS
  Filled 2011-03-15: qty 10

## 2011-03-15 MED ORDER — LEVOFLOXACIN IN D5W 750 MG/150ML IV SOLN
750.0000 mg | INTRAVENOUS | Status: DC
  Administered 2011-03-15: 750 mg via INTRAVENOUS
  Filled 2011-03-15 (×3): qty 150

## 2011-03-15 MED ORDER — LEVALBUTEROL HCL 0.63 MG/3ML IN NEBU
0.6300 mg | INHALATION_SOLUTION | Freq: Four times a day (QID) | RESPIRATORY_TRACT | Status: DC
  Administered 2011-03-15 – 2011-03-18 (×11): 0.63 mg via RESPIRATORY_TRACT
  Filled 2011-03-15 (×16): qty 3

## 2011-03-15 MED ORDER — FUROSEMIDE 10 MG/ML IJ SOLN
40.0000 mg | Freq: Three times a day (TID) | INTRAMUSCULAR | Status: AC
  Administered 2011-03-15 – 2011-03-16 (×4): 40 mg via INTRAVENOUS
  Filled 2011-03-15 (×4): qty 4

## 2011-03-15 MED ORDER — WARFARIN SODIUM 4 MG PO TABS
4.0000 mg | ORAL_TABLET | Freq: Once | ORAL | Status: AC
  Administered 2011-03-15: 4 mg via ORAL
  Filled 2011-03-15: qty 1

## 2011-03-15 MED ORDER — LEVALBUTEROL HCL 1.25 MG/0.5ML IN NEBU
1.2500 mg | INHALATION_SOLUTION | Freq: Once | RESPIRATORY_TRACT | Status: AC
  Administered 2011-03-15: 1.25 mg via RESPIRATORY_TRACT
  Filled 2011-03-15: qty 0.5

## 2011-03-15 NOTE — ED Provider Notes (Signed)
I saw and evaluated the patient, reviewed the resident's note and I agree with the findings and plan. Patient came in with episode of syncope. History of an AICD for A. fib. Patient had increasing dyspnea and eventually required intubation likely CHF exacerbation, although pneumonia was also treated by the intensivist AICD was interrogated and found to be not paced and have her will as P waves. He was unsure if it was  retrograde or anterograde. Nitroglycerin drip was started as tolerated. Cardiology and pulmonary critical care both saw the patient in the ED.   CRITICAL CARE Performed by: Billee Cashing   Total critical care time: 30  Critical care time was exclusive of separately billable procedures and treating other patients.  Critical care was necessary to treat or prevent imminent or life-threatening deterioration.  Critical care was time spent personally by me on the following activities: development of treatment plan with patient and/or surrogate as well as nursing, discussions with consultants, evaluation of patient's response to treatment, examination of patient, obtaining history from patient or surrogate, ordering and performing treatments and interventions, ordering and review of laboratory studies, ordering and review of radiographic studies, pulse oximetry and re-evaluation of patient's condition.  Juliet Rude. Rubin Payor, MD 03/15/11 [phone]

## 2011-03-15 NOTE — Progress Notes (Signed)
I have had extensive discussions with family and the pt. We discussed patients current circumstances and organ failures. We also discussed patient's prior wishes under circumstances such as this. Family has decided to NOT perform resuscitation if arrest or any ETT in future but to continue current medical support for now. Would like NIMV x 24 hr and lasix and re eval. May need hospice at home set up if improves to go home.  Mcarthur Rossetti. Tyson Alias, MD, FACP Pgr: [phone] [facility]

## 2011-03-15 NOTE — Progress Notes (Signed)
Pt profile: 77 yowm with severe ischemic CM (EF 10%) who presented to [facility] ED via EMS on 2/16 with recurrent syncope and was intubated in ED after failing NPPV attempt for hypoxic respiratory failure. Adm dx was pulmonary edema +/- PNA. PMH also includes longstanding seizure d/o  Lines, Tubes, etc: ETT 2/16 >> 2/17  Microbiology: MRSA PCR >> NEG Blood X 2 2/16 >>   Antibiotics:  Vanc 2/16 >> 2/16 Zosyn 2/16 >>  Studies/Events: 2/17- extubated 2/18- escallating resp failure and hypoxia  Consults:  LHC Cards 2/16 >>   Best Practice: DVT: full dose warfarin SUP: PPI Nutrition: NPO Glycemic control: not needed   Subj: Increasing O2 needs  Obj: Filed Vitals:   03/15/11 1100  BP: 117/64  Pulse: 107  Temp:   Resp: 24    Gen: mild - moderate distress HEENT: WNL Neck: JVP  Chest:  ronchi bilat, mild exp wheezing Cardiac: irreg, + syst M Abd: NABS, soft Ext:  No edema  BMET    Component Value Date/Time   NA 142 03/15/2011 0500   K 3.8 03/15/2011 0500   CL 104 03/15/2011 0500   CO2 28 03/15/2011 0500   GLUCOSE 134* 03/15/2011 0500   BUN 28* 03/15/2011 0500   CREATININE 0.93 03/15/2011 0500   CALCIUM 8.7 03/15/2011 0500   GFRNONAA 80* 03/15/2011 0500   GFRAA >90 03/15/2011 0500    CBC    Component Value Date/Time   WBC 11.6* 03/15/2011 0500   RBC 3.26* 03/15/2011 0500   HGB 10.2* 03/15/2011 0500   HCT 31.6* 03/15/2011 0500   PLT 161 03/15/2011 0500   MCV 96.9 03/15/2011 0500   MCH 31.3 03/15/2011 0500   MCHC 32.3 03/15/2011 0500   RDW 14.6 03/15/2011 0500   LYMPHSABS 2.5 03/12/2011 2245   MONOABS 1.5* 03/12/2011 2245   EOSABS 0.2 03/12/2011 2245   BASOSABS 0.0 03/12/2011 2245   PCT: 2/63  CXR:    IMPRESSION: 1) Acute on chronic respiratory failure due to exacerbation of CHF with acute pulmonary edema +/- PNA. Clinical picture is most suggestive of pulmonary edema but mildly elevated PCT worrisome for possibility of active infection as well. -post extubation, now  rising O 2needs, distress, pcxr c/w edema -re add aggressive lasix, appears that renal can tolerate this -consider short term NIMV -hope the edema is reversible and not infectious infiltrates -will again discuss DNI status and consider 24 hours further aggressive treatment , if fail then comfort? pcxr in am  consider cpap 4 hours on 2 hours off as we add lasix Add atypical levofloxacin coevarge  2) End Stage Cardiomyopathy - LHC Cards following.  -Low dose metoprolol ordered -Daughter indicates that he has Living Will and would not wish to undergo ACLS/CPR. I have made NCB in event of cardiac arrest. Will addressee reinubtation now lasix  3) Seizure d/o - concern for seizure raised by RN AM 2/17. He has been continued on AEDs and was also on propofol. Exhibited no evidence of post-ictal state.  -Change AEDs to IV while NPO post extubation  dilantin  30 mins CCM time  Mcarthur Rossetti. Tyson Alias, MD, FACP Pgr: [phone] [facility]

## 2011-03-15 NOTE — Progress Notes (Signed)
RT note 1605  1555 RN informed RT she had removed pt from BIpap, was scheduled to come off at 1610. RN placed pt back on 50% VM. Sat 95%.

## 2011-03-15 NOTE — Progress Notes (Signed)
Pt with episode of unresponsiveness lasting approx 1 min accompanied with tachycardia. Pt then became responsive complaining of generalized pain. Pt medicated, Dr Tyson Alias made aware. Will continue to monitor. [place], [place] M

## 2011-03-15 NOTE — Progress Notes (Signed)
Nutrition Follow-up  Diet Order:  NPO  Meds: Scheduled Meds:   . antiseptic oral rinse  15 mL Mouth Rinse QID  . budesonide-formoterol  2 puff Inhalation BID  . chlorhexidine  15 mL Mouth Rinse BID  . digoxin  0.25 mg Intravenous Daily  . furosemide  40 mg Intravenous Q8H  . levalbuterol  0.63 mg Nebulization Q6H  . levalbuterol  1.25 mg Nebulization Once  . levofloxacin (LEVAQUIN) IV  750 mg Intravenous Q24H  . metoprolol  2.5 mg Intravenous Q6H  . pantoprazole (PROTONIX) IV  40 mg Intravenous QHS  . PHENObarbital  48.6 mg Intravenous Daily  . PHENObarbital  97.2 mg Intravenous QHS  . phenytoin (DILANTIN) IV  200 mg Intravenous QHS  . phenytoin (DILANTIN) IV  100 mg Intravenous Q0600  . phenytoin (DILANTIN) IV  100 mg Intravenous Q1400  . piperacillin-tazobactam (ZOSYN)  IV  3.375 g Intravenous Q8H  . rosuvastatin  20 mg Oral Daily  . warfarin  4 mg Oral ONCE-1800  . DISCONTD: furosemide  40 mg Intravenous Q8H   Continuous Infusions:  PRN Meds:.sodium chloride, acetaminophen (TYLENOL) oral liquid 160 mg/5 mL, fentaNYL, metoprolol, DISCONTD: levalbuterol  Labs:  CMP     Component Value Date/Time   NA 142 03/15/2011 0500   K 3.8 03/15/2011 0500   CL 104 03/15/2011 0500   CO2 28 03/15/2011 0500   GLUCOSE 134* 03/15/2011 0500   BUN 28* 03/15/2011 0500   CREATININE 0.93 03/15/2011 0500   CALCIUM 8.7 03/15/2011 0500   PROT 7.2 03/12/2011 2245   ALBUMIN 3.7 03/12/2011 2245   AST 30 03/12/2011 2245   ALT 29 03/12/2011 2245   ALKPHOS 138* 03/12/2011 2245   BILITOT 0.2* 03/12/2011 2245   GFRNONAA 80* 03/15/2011 0500   GFRAA >90 03/15/2011 0500     Intake/Output Summary (Last 24 hours) at 03/15/11 1243 Last data filed at 03/15/11 1200  Gross per 24 hour  Intake    634 ml  Output    450 ml  Net    184 ml   Pt extubated on 2/17. Per MD note respiratory status is worsening. Pt is now NCB and remains NPO at this time.   Weight Status:  145 lbs 2/18 Weight range: 145-160 lbs due to  fluid  Re-estimated needs:  Kcal:1900-2100; Protein: 95-105 grams  Nutrition Dx:  Inadequate oral intake (NI-2.1). Status: Ongoing  Goal:  EN will provide >90% of estimated nutrition needs; no longer applicable New Goal: Nutrition intervention to support quality of life/goalso of care.  Intervention:     None at this time  Monitor:  Diet advancement, weights   Kendell Bane Cornelison Pager #:  [phone]

## 2011-03-15 NOTE — Progress Notes (Signed)
Subjective:   Pt has known end stage CHF and end stage COPD.  Admitted with worsening dysnea and syncope.    Still has lots of rhonchi.      Marland Kitchen antiseptic oral rinse  15 mL Mouth Rinse QID  . budesonide-formoterol  2 puff Inhalation BID  . chlorhexidine  15 mL Mouth Rinse BID  . digoxin  0.25 mg Intravenous Daily  . metoprolol  2.5 mg Intravenous Q6H  . pantoprazole (PROTONIX) IV  40 mg Intravenous QHS  . PHENObarbital  48.6 mg Intravenous Daily  . PHENObarbital  97.2 mg Intravenous QHS  . phenytoin (DILANTIN) IV  200 mg Intravenous QHS  . phenytoin (DILANTIN) IV  100 mg Intravenous Q0600  . phenytoin (DILANTIN) IV  100 mg Intravenous Q1400  . piperacillin-tazobactam (ZOSYN)  IV  3.375 g Intravenous Q8H  . rosuvastatin  20 mg Oral Daily  . DISCONTD: warfarin  7.5 mg Oral q1800      Objective:  Vital Signs in the last 24 hours: Blood pressure 107/62, pulse 102, temperature 100 F (37.8 C), temperature source Axillary, resp. rate 23, height 5\' 8"  (1.727 m), weight 145 lb 15.1 oz (66.2 kg), SpO2 94.00%. Temp:  [98.2 F (36.8 C)-100.8 F (38.2 C)] 100 F (37.8 C) (02/18 0800) Pulse Rate:  [77-110] 102  (02/18 0800) Resp:  [16-23] 23  (02/18 0800) BP: (92-120)/(47-96) 107/62 mmHg (02/18 0800) SpO2:  [92 %-100 %] 94 % (02/18 0800) FiO2 (%):  [40.1 %-100 %] 50 % (02/18 0800) Weight:  [145 lb 15.1 oz (66.2 kg)] 145 lb 15.1 oz (66.2 kg) (02/18 0300)  Intake/Output from previous day: 02/17 0701 - 02/18 0700 In: 620.9 [I.V.:482.9; NG/GT:30; IV Piggyback:108] Out: 1230 [Urine:1230] Intake/Output from this shift: Total I/O In: 120 [I.V.:20; IV Piggyback:100] Out: 100 [Urine:100]  Physical Exam:  Physical Exam: Blood pressure 107/62, pulse 102, temperature 100 F (37.8 C), temperature source Axillary, resp. rate 23, height 5\' 8"  (1.727 m), weight 145 lb 15.1 oz (66.2 kg), SpO2 94.00%. General: Well developed, well nourished, in no acute distress. Head:  Normocephalic, atraumatic, sclera non-icteric, mucus membranes are moist,  Neck: Supple. Negative for carotid bruits. JVD not elevated. Lungs: extensive rhonchi Heart: RRR with S1 S2. tachy Abdomen: Soft, non-tender, non-distended with normoactive bowel sounds. No hepatomegaly. No rebound/guarding. No obvious abdominal masses. Msk:  Strength and tone appear normal for age. Extremities: No clubbing or cyanosis. No edema.  Distal pedal pulses are 2+ and equal bilaterally. Neuro: Alert and oriented X 3. Moves all extremities spontaneously. Psych:  Responds to questions appropriately with a normal affect.    Lab Results:   Basename 03/15/11 0500 03/14/11 0526 03/13/11 0530  NA 142 139 --  K 3.8 4.3 --  CL 104 103 --  CO2 28 26 --  GLUCOSE 134* 179* --  BUN 28* 33* --  CREATININE 0.93 0.99 --  CALCIUM 8.7 8.3* --  MG -- -- 2.0  PHOS -- -- 4.4    Basename 03/12/11 2245  AST 30  ALT 29  ALKPHOS 138*  BILITOT 0.2*  PROT 7.2  ALBUMIN 3.7   No results found for this basename: LIPASE:2,AMYLASE:2 in the last 72 hours  Basename 03/15/11 0500 03/14/11 0526 03/12/11 2245  WBC 11.6* 13.5* --  NEUTROABS -- -- 12.6*  HGB 10.2* 10.7* --  HCT 31.6* 32.9* --  MCV 96.9 97.1 --  PLT 161 165 --    Basename 03/12/11 2245  CKTOTAL --  CKMB --  TROPONINI <0.30   No components found with this basename: POCBNP:3 No results found for this basename: DDIMER in the last 72 hours No results found for this basename: HGBA1C in the last 72 hours No results found for this basename: CHOL,HDL,LDLCALC,TRIG,CHOLHDL in the last 72 hours No results found for this basename: TSH,T4TOTAL,FREET3,T3FREE,THYROIDAB in the last 72 hours No results found for this basename: VITAMINB12,FOLATE,FERRITIN,TIBC,IRON,RETICCTPCT in the last 72 hours  Tele: sinus tach  Assessment/Plan:    Continue supportive care.  I would favor a hospice consult.  Pt is now an DNR.  Echo cancelled    Disposition:  Alvia Grove., MD, [facility] 03/15/2011, 8:43 AM LOS: Day 3

## 2011-03-15 NOTE — Progress Notes (Signed)
eLink Physician-Brief Progress Note Patient Name: Arjan Strohm DOB: [birth year] MRN: [id number]  Date of Service  03/15/2011   HPI/Events of Note   REspiratory distress off bipap, pt wants bipap off  eICU Interventions  See orders for MSO4 drip for comfort    Intervention Category Major Interventions: Respiratory failure - evaluation and management  Shan Levans 03/15/2011, 10:13 PM

## 2011-03-15 NOTE — Progress Notes (Addendum)
ANTICOAGULATION CONSULT NOTE - FOLLOW UP  Pharmacy Consult:  warfarin/dilantin/phenobarbital Indication: atrial fibrillation/seizure  Allergies  Allergen Reactions  . Valsartan Other (See Comments)    Wife doesn't remember, possible sick to stomach    Patient Measurements: Height: 5\' 8"  (172.7 cm) Weight: 145 lb 15.1 oz (66.2 kg) IBW/kg (Calculated) : 68.4    Vital Signs: Temp: 100 F (37.8 C) (02/18 0800) Temp src: Axillary (02/18 0800) BP: 107/62 mmHg (02/18 0800) Pulse Rate: 102  (02/18 0800)  Labs:  Basename 03/15/11 0500 03/14/11 0526 03/13/11 0530 03/12/11 2245  HGB 10.2* 10.7* -- --  HCT 31.6* 32.9* 36.8* --  PLT 161 165 186 --  APTT -- -- -- --  LABPROT 31.3* 32.7* 25.7* --  INR 2.96* 3.13* 2.30* --  HEPARINUNFRC -- -- -- --  CREATININE 0.93 0.99 1.02 --  CKTOTAL -- -- -- --  CKMB -- -- -- --  TROPONINI -- -- -- <0.30   Estimated Creatinine Clearance: 64.3 ml/min (by C-G formula based on Cr of 0.93).  Medical History: Past Medical History  Diagnosis Date  . Tobacco use disorder   . Esophageal reflux   . Unspecified cerebral artery occlusion with cerebral infarction   . Other and unspecified hyperlipidemia   . Dual implantable cardiac defibrillator in situ     [employer]  . ischemic cardiomyopathy     MI in 1989 with subsequent CABG, bare-metal stent to circumflex 11/2010, ejection fraction 10-15%  . Coronary artery disease     s/p stent to the distal LCX in November 2012 - on Plavix  . Chronic systolic heart failure     EF is 10 to 15%  . Sinoatrial node dysfunction   . Atrial fibrillation     on coumadin  . Stroke   . Angina   . COPD (chronic obstructive pulmonary disease)   . Anemia   . Heart murmur   . Shortness of breath   . Seizures   . Anxiety   . Pneumonia   . LBBB (left bundle branch block)   . MI, old     Medications:  Prescriptions prior to admission  Medication Sig Dispense Refill  . albuterol (PROVENTIL HFA;VENTOLIN HFA)  108 (90 BASE) MCG/ACT inhaler Inhale 2 puffs into the lungs every 4 (four) hours as needed.      Marland Kitchen albuterol (PROVENTIL) (2.5 MG/3ML) 0.083% nebulizer solution Take 2.5 mg by nebulization 4 (four) times daily as needed.      . budesonide-formoterol (SYMBICORT) 160-4.5 MCG/ACT inhaler Inhale 2 puffs into the lungs 2 (two) times daily.      . digoxin (LANOXIN) 0.25 MG tablet Take 0.5 tablets (125 mcg total) by mouth daily.  20 tablet  11  . FeFum-FePoly-FA-B Cmp-C-Biot (FOLIVANE-PLUS) CAPS Take 1 capsule by mouth daily.        . fish oil-omega-3 fatty acids 1000 MG capsule Take 1 g by mouth 3 (three) times daily.       . furosemide (LASIX) 40 MG tablet Take a tablet and a half (60mg ) two times a day  270 tablet  3  . metoprolol tartrate (LOPRESSOR) 12.5 mg TABS Take 0.5 tablets (12.5 mg total) by mouth 2 (two) times daily.  20 tablet  11  . nitroGLYCERIN (NITROSTAT) 0.4 MG SL tablet Place 1 tablet (0.4 mg total) under the tongue every 5 (five) minutes as needed for chest pain.  25 tablet  3  . omeprazole (PRILOSEC OTC) 20 MG tablet Take 20 mg by mouth daily.        Marland Kitchen  PHENobarbital (LUMINAL) 97.2 MG tablet Take 48.6-97.2 mg by mouth 2 (two) times daily. Patient takes 1/2 tablet in the morning and 1 tablet in the evening      . phenytoin (DILANTIN) 100 MG ER capsule Take 100-200 mg by mouth 3 (three) times daily. Patient takes 1 tablet in the morning, 1 tablet at noon and 2 tablets in the evening      . potassium chloride (KLOR-CON) 10 MEQ CR tablet Take 10 mEq by mouth 2 (two) times daily.       . simvastatin (ZOCOR) 80 MG tablet Take 80 mg by mouth every other day.       . warfarin (COUMADIN) 5 MG tablet Take 7.5 mg by mouth daily.        Assessment: 77 YOM on chronic warfarin for h/o Afib. INR therapeutic at 2.96, no issues noted.  Pharmacy also monitoring Dilantin and phenobarb for h/o seizure.  Noted pt with possible seizure episode this AM.  Patient's dilantin and phenobarbital levels on admit  were therapeutic at 12.5 mcg/mL and 35 mcg/mL, respectively.  Will continue to monitor and hold off on adjusting seizure meds.   Goal of Therapy:  INR 2-3    Plan:  - Coumadin 4mg  PO today - Daily PT/INR - Continue Dilantin and phenobarbital at home doses - Monitor for seizure activities - F/U diet initiation to change meds to PO - Continue Zosyn 3.375gm IV Q8H (4 hr infusion)   Phillips Climes, PharmD 03/15/2011,9:31 AM

## 2011-03-15 NOTE — Progress Notes (Signed)
03/09/2010 23:00  - RT Note - Removed Patient from BiPAP per patient's and wife's request.  Placed patient back on 50% ventimask.  Patient is not as anxious and is now resting more comfortably.  BiPAP order for 4 hrs on, 2 hrs off discontinued by CCM  Gypsy Decant, RRT, RCP.

## 2011-03-16 ENCOUNTER — Inpatient Hospital Stay (HOSPITAL_COMMUNITY): Payer: Medicare Other

## 2011-03-16 DIAGNOSIS — I428 Other cardiomyopathies: Secondary | ICD-10-CM

## 2011-03-16 DIAGNOSIS — Z9911 Dependence on respirator [ventilator] status: Secondary | ICD-10-CM

## 2011-03-16 DIAGNOSIS — J810 Acute pulmonary edema: Secondary | ICD-10-CM

## 2011-03-16 DIAGNOSIS — J9600 Acute respiratory failure, unspecified whether with hypoxia or hypercapnia: Secondary | ICD-10-CM

## 2011-03-16 DIAGNOSIS — I5023 Acute on chronic systolic (congestive) heart failure: Secondary | ICD-10-CM

## 2011-03-16 LAB — CBC
Hemoglobin: 9.8 g/dL — ABNORMAL LOW (ref 13.0–17.0)
MCHC: 32.9 g/dL (ref 30.0–36.0)
Platelets: 176 10*3/uL (ref 150–400)
RBC: 3.1 MIL/uL — ABNORMAL LOW (ref 4.22–5.81)

## 2011-03-16 LAB — COMPREHENSIVE METABOLIC PANEL
CO2: 30 mEq/L (ref 19–32)
Calcium: 8.7 mg/dL (ref 8.4–10.5)
Creatinine, Ser: 1.03 mg/dL (ref 0.50–1.35)
GFR calc Af Amer: 80 mL/min — ABNORMAL LOW (ref >90)
GFR calc non Af Amer: 69 mL/min — ABNORMAL LOW (ref >90)
Glucose, Bld: 124 mg/dL — ABNORMAL HIGH (ref 70–99)
Total Bilirubin: 0.6 mg/dL (ref 0.3–1.2)

## 2011-03-16 LAB — DIFFERENTIAL
Basophils Relative: 0 % (ref 0–1)
Monocytes Relative: 11 % (ref 3–12)
Neutro Abs: 8.8 10*3/uL — ABNORMAL HIGH (ref 1.7–7.7)
Neutrophils Relative %: 78 % — ABNORMAL HIGH (ref 43–77)

## 2011-03-16 LAB — TRIGLYCERIDES: Triglycerides: 84 mg/dL (ref <150)

## 2011-03-16 MED ORDER — PHENOBARBITAL 32.4 MG PO TABS
60.0000 mg | ORAL_TABLET | Freq: Two times a day (BID) | ORAL | Status: DC
  Administered 2011-03-17 – 2011-03-18 (×4): 64.8 mg via ORAL
  Filled 2011-03-16: qty 1
  Filled 2011-03-16: qty 2
  Filled 2011-03-16: qty 1
  Filled 2011-03-16 (×2): qty 2

## 2011-03-16 MED ORDER — ASPIRIN 81 MG PO CHEW
81.0000 mg | CHEWABLE_TABLET | Freq: Every day | ORAL | Status: DC
  Administered 2011-03-17 – 2011-03-18 (×2): 81 mg via ORAL
  Filled 2011-03-16 (×2): qty 1

## 2011-03-16 MED ORDER — DIGOXIN 125 MCG PO TABS
0.1250 mg | ORAL_TABLET | Freq: Every day | ORAL | Status: DC
  Administered 2011-03-17 – 2011-03-18 (×2): 0.125 mg via ORAL
  Filled 2011-03-16 (×2): qty 1

## 2011-03-16 MED ORDER — PHENYTOIN 50 MG PO CHEW
200.0000 mg | CHEWABLE_TABLET | Freq: Two times a day (BID) | ORAL | Status: DC
  Administered 2011-03-16 – 2011-03-18 (×4): 200 mg via ORAL
  Filled 2011-03-16 (×5): qty 4

## 2011-03-16 MED ORDER — PHENOBARBITAL 60 MG PO TABS: 60.0000 mg | ORAL_TABLET | Freq: Two times a day (BID) | ORAL | Status: DC

## 2011-03-16 MED ORDER — NITROGLYCERIN 0.3 MG/HR TD PT24
0.3000 mg | MEDICATED_PATCH | TRANSDERMAL | Status: DC
  Administered 2011-03-16 – 2011-03-17 (×2): 0.3 mg via TRANSDERMAL
  Filled 2011-03-16 (×3): qty 1

## 2011-03-16 MED ORDER — FUROSEMIDE 40 MG PO TABS
40.0000 mg | ORAL_TABLET | Freq: Two times a day (BID) | ORAL | Status: DC
  Administered 2011-03-16 – 2011-03-17 (×2): 40 mg via ORAL
  Filled 2011-03-16 (×4): qty 1

## 2011-03-16 MED ORDER — ACETAMINOPHEN 325 MG PO TABS
650.0000 mg | ORAL_TABLET | Freq: Four times a day (QID) | ORAL | Status: DC | PRN
  Administered 2011-03-16 – 2011-03-17 (×4): 650 mg via ORAL
  Filled 2011-03-16 (×4): qty 2

## 2011-03-16 MED ORDER — LEVOFLOXACIN 500 MG PO TABS
500.0000 mg | ORAL_TABLET | Freq: Every day | ORAL | Status: DC
  Administered 2011-03-16 – 2011-03-18 (×3): 500 mg via ORAL
  Filled 2011-03-16 (×4): qty 1

## 2011-03-16 MED ORDER — METOPROLOL SUCCINATE ER 25 MG PO TB24
25.0000 mg | ORAL_TABLET | Freq: Every day | ORAL | Status: DC
  Administered 2011-03-16 – 2011-03-18 (×3): 25 mg via ORAL
  Filled 2011-03-16 (×4): qty 1

## 2011-03-16 MED ORDER — MORPHINE SULFATE 2 MG/ML IJ SOLN
2.0000 mg | INTRAMUSCULAR | Status: DC | PRN
  Administered 2011-03-17 (×2): 2 mg via INTRAVENOUS
  Filled 2011-03-16 (×2): qty 1

## 2011-03-16 MED ORDER — PHENYTOIN SODIUM 50 MG/ML IJ SOLN
100.0000 mg | Freq: Two times a day (BID) | INTRAMUSCULAR | Status: DC
  Administered 2011-03-16: 100 mg via INTRAVENOUS
  Filled 2011-03-16 (×2): qty 2

## 2011-03-16 MED ORDER — POTASSIUM CHLORIDE CRYS ER 20 MEQ PO TBCR
40.0000 meq | EXTENDED_RELEASE_TABLET | Freq: Two times a day (BID) | ORAL | Status: DC
  Administered 2011-03-16 – 2011-03-18 (×5): 40 meq via ORAL
  Filled 2011-03-16 (×6): qty 2

## 2011-03-16 NOTE — Progress Notes (Signed)
Subjective:   Pt has known end stage CHF and end stage COPD.  Admitted with worsening dysnea and syncope.    Still has lots of rhonchi.  He has been weaned off BIPAP and is now on face mask with O2 sats of 100%      . antiseptic oral rinse  15 mL Mouth Rinse QID  . budesonide-formoterol  2 puff Inhalation BID  . chlorhexidine  15 mL Mouth Rinse BID  . digoxin  0.25 mg Intravenous Daily  . furosemide  40 mg Intravenous Q8H  . levalbuterol  0.63 mg Nebulization Q6H  . levalbuterol  1.25 mg Nebulization Once  . levofloxacin (LEVAQUIN) IV  750 mg Intravenous Q24H  . metoprolol  2.5 mg Intravenous Q6H  . pantoprazole (PROTONIX) IV  40 mg Intravenous QHS  . PHENObarbital  48.6 mg Intravenous Daily  . PHENObarbital  97.2 mg Intravenous QHS  . phenytoin (DILANTIN) IV  200 mg Intravenous QHS  . phenytoin (DILANTIN) IV  100 mg Intravenous BID WC  . piperacillin-tazobactam (ZOSYN)  IV  3.375 g Intravenous Q8H  . rosuvastatin  20 mg Oral Daily  . warfarin  4 mg Oral ONCE-1800  . DISCONTD: furosemide  40 mg Intravenous Q8H  . DISCONTD: phenytoin (DILANTIN) IV  100 mg Intravenous Q0600  . DISCONTD: phenytoin (DILANTIN) IV  100 mg Intravenous Q1400      . morphine 1 mg/hr (03/15/11 2319)  . DISCONTD: morphine      Objective:  Vital Signs in the last 24 hours: Blood pressure 117/47, pulse 90, temperature 98.1 F (36.7 C), temperature source Oral, resp. rate 17, height 5\' 8"  (1.727 m), weight 143 lb 15.4 oz (65.3 kg), SpO2 99.00%. Temp:  [98 F (36.7 C)-102.1 F (38.9 C)] 98.1 F (36.7 C) (02/19 0801) Pulse Rate:  [66-129] 90  (02/19 0900) Resp:  [15-29] 17  (02/19 0900) BP: (94-121)/(46-73) 117/47 mmHg (02/19 0900) SpO2:  [86 %-100 %] 99 % (02/19 0900) FiO2 (%):  [30 %-97.2 %] 40 % (02/19 0900) Weight:  [143 lb 15.4 oz (65.3 kg)] 143 lb 15.4 oz (65.3 kg) (02/19 0400)  Intake/Output from previous day: 02/18 0701 - 02/19 0700 In: 989.7 [I.V.:417.7; IV  Piggyback:572] Out: 2272 [Urine:2272] Intake/Output from this shift: Total I/O In: 94 [I.V.:42; IV Piggyback:52] Out: 650 [Urine:650]  Physical Exam:  Physical Exam: Blood pressure 117/47, pulse 90, temperature 98.1 F (36.7 C), temperature source Oral, resp. rate 17, height 5\' 8"  (1.727 m), weight 143 lb 15.4 oz (65.3 kg), SpO2 99.00%. General: Well developed, well nourished, in no acute distress. Head: Normocephalic, atraumatic, sclera non-icteric, mucus membranes are moist,  Neck: Supple. Negative for carotid bruits. JVD not elevated. Lungs:  rhonchi, better than yesterday Heart: RRR with S1 S2. tachy Abdomen: Soft, non-tender, non-distended with normoactive bowel sounds. No hepatomegaly. No rebound/guarding. No obvious abdominal masses. Msk:  Strength and tone appear normal for age. Extremities: No clubbing or cyanosis. No edema.  Distal pedal pulses are 2+ and equal bilaterally. Neuro: Alert and oriented X 3. Moves all extremities spontaneously. Psych:  Responds to questions appropriately with a normal affect.    Lab Results:   Basename 03/16/11 0425 03/15/11 0500  NA 142 142  K 3.2* 3.8  CL 102 104  CO2 30 28  GLUCOSE 124* 134*  BUN 25* 28*  CREATININE 1.03 0.93  CALCIUM 8.7 8.7  MG -- --  PHOS -- --    Basename 03/16/11 0425  AST 39*  ALT  24  ALKPHOS 102  BILITOT 0.6  PROT 6.6  ALBUMIN 2.4*   No results found for this basename: LIPASE:2,AMYLASE:2 in the last 72 hours  Basename 03/16/11 0425 03/15/11 0500  WBC 11.3* 11.6*  NEUTROABS 8.8* --  HGB 9.8* 10.2*  HCT 29.8* 31.6*  MCV 96.1 96.9  PLT 176 161   No results found for this basename: CKTOTAL:4,CKMB:4,TROPONINI:4 in the last 72 hours No components found with this basename: POCBNP:3 No results found for this basename: DDIMER in the last 72 hours No results found for this basename: HGBA1C in the last 72 hours  Basename 03/16/11 0425  CHOL --  HDL --  LDLCALC --  TRIG 84  CHOLHDL --   No  results found for this basename: TSH,T4TOTAL,FREET3,T3FREE,THYROIDAB in the last 72 hours No results found for this basename: VITAMINB12,FOLATE,FERRITIN,TIBC,IRON,RETICCTPCT in the last 72 hours  Tele: sinus tach  Assessment/Plan:    Continue supportive care.  I would favor a hospice consult.  Pt is now an DNR.  I think he can go to step down unit today.  I talked this am with his wife.  I think home Hospice would be appropriate.    [name], Montez Hageman., MD, [facility] 03/16/2011, 11:14 AM LOS: Day 4

## 2011-03-16 NOTE — Progress Notes (Signed)
Palliative Medicine Team consult for symptom management and transition to home with hospice requested by Dr Sung Amabile; spoke with patient's wife [name] and patient at bedside. Meeting scheduled for tomorrow, Wednesday 03/17/11 @ 11:00 am.  [name], RN 03/16/2011, 6:10 PM Palliative Medicine Team RN Liaison [phone]

## 2011-03-16 NOTE — Progress Notes (Signed)
Pt profile: 77 yowm with severe ischemic CM (EF 10%) who presented to [facility] ED via EMS on 2/16 with recurrent syncope and was intubated in ED after failing NPPV attempt for hypoxic respiratory failure. Adm dx was pulmonary edema +/- PNA. PMH also includes longstanding seizure d/o  Lines, Tubes, etc: ETT 2/16 >> 2/17  Microbiology: MRSA PCR >> NEG Blood X 2 2/16 >> NEG  Antibiotics:  Vanc 2/16 >> 2/16 Zosyn 2/16 >> 2/16 Levofloxacin 2/18 >> 2/23 (stop date ordered)  Studies/Events: 2/16 DNR established 2/17 extubated 2/18 Do not re-intubate established 2/18 Morphine gtt started for respiratory distress 2/19 Much improved resp status. MSO4 gtt changed to PRN 2/19 Palliative Care consult requested. Transferred to PCU  Consults:  LHC Cards 2/16     Subj: Up in chair, lethargic but conversant despite morphine gtt. No distress  Obj: Filed Vitals:   03/16/11 1802  BP: 108/55  Pulse: 74  Temp: 98.2 F (36.8 C)  Resp: 18    Gen: no distress on VM @ 50% HEENT: WNL Neck: JVP  Chest: bibasilar rales, no wheezes Cardiac: irreg, + syst M Abd: NABS, soft Ext:  No edema  BMET    Component Value Date/Time   NA 142 03/16/2011 0425   K 3.2* 03/16/2011 0425   CL 102 03/16/2011 0425   CO2 30 03/16/2011 0425   GLUCOSE 124* 03/16/2011 0425   BUN 25* 03/16/2011 0425   CREATININE 1.03 03/16/2011 0425   CALCIUM 8.7 03/16/2011 0425   GFRNONAA 69* 03/16/2011 0425   GFRAA 80* 03/16/2011 0425    CBC    Component Value Date/Time   WBC 11.3* 03/16/2011 0425   RBC 3.10* 03/16/2011 0425   HGB 9.8* 03/16/2011 0425   HCT 29.8* 03/16/2011 0425   PLT 176 03/16/2011 0425   MCV 96.1 03/16/2011 0425   MCH 31.6 03/16/2011 0425   MCHC 32.9 03/16/2011 0425   RDW 14.5 03/16/2011 0425   LYMPHSABS 1.1 03/16/2011 0425   MONOABS 1.3* 03/16/2011 0425   EOSABS 0.0 03/16/2011 0425   BASOSABS 0.0 03/16/2011 0425     CXR: NSC bilat AS dz - edema vs infiltrates  IMPRESSION: 1) Acute on chronic respiratory  failure due to exacerbation of CHF with acute pulmonary edema +/- PNA. Clinical picture is most suggestive of pulmonary edema but mildly elevated PCT worrisome for possibility of active infection as well. - Continue diuresis to extent permitted by BP - Cont O2 - Change morphine to PRN only - Change abx to PO Levofloxacin - Transfer to PCU  2) End Stage Cardiomyopathy - LHC Cards following and has recommended Hospice - D/C warfarin and statin as benefit at this point in time is minimal  - Add NTG patch for preload reduction - Change metoprolol and digoxin to PO  3) Seizure d/o - no evidence of active seizure. Change AEDs to PO     Daughter updated @ bedside. Our goal is to get him home with Hospice if possibility  35 mins CCM time  Billy Fischer, MD;  PCCM service; Mobile [phone]

## 2011-03-16 NOTE — Progress Notes (Signed)
ANTICOAGULATION CONSULT NOTE - FOLLOW UP  Pharmacy Consult:  Coumadin Indication: atrial fibrillation  Allergies  Allergen Reactions  . Valsartan Other (See Comments)    Wife doesn't remember, possible sick to stomach    Patient Measurements: Height: 5\' 8"  (172.7 cm) Weight: 143 lb 15.4 oz (65.3 kg) IBW/kg (Calculated) : 68.4    Vital Signs: Temp: 98.1 F (36.7 C) (02/19 0801) Temp src: Oral (02/19 0801) BP: 117/47 mmHg (02/19 0900) Pulse Rate: 90  (02/19 0900)  Labs:  Basename 03/16/11 0425 03/15/11 0500 03/14/11 0526  HGB 9.8* 10.2* --  HCT 29.8* 31.6* 32.9*  PLT 176 161 165  APTT -- -- --  LABPROT 39.0* 31.3* 32.7*  INR 3.93* 2.96* 3.13*  HEPARINUNFRC -- -- --  CREATININE 1.03 0.93 0.99  CKTOTAL -- -- --  CKMB -- -- --  TROPONINI -- -- --   Estimated Creatinine Clearance: 57.2 ml/min (by C-G formula based on Cr of 1.03).  Medical History: Past Medical History  Diagnosis Date  . Tobacco use disorder   . Esophageal reflux   . Unspecified cerebral artery occlusion with cerebral infarction   . Other and unspecified hyperlipidemia   . Dual implantable cardiac defibrillator in situ     Medtronic  . ischemic cardiomyopathy     MI in 1989 with subsequent CABG, bare-metal stent to circumflex 11/2010, ejection fraction 10-15%  . Coronary artery disease     s/p stent to the distal LCX in November 2012 - on Plavix  . Chronic systolic heart failure     EF is 10 to 15%  . Sinoatrial node dysfunction   . Atrial fibrillation     on coumadin  . Stroke   . Angina   . COPD (chronic obstructive pulmonary disease)   . Anemia   . Heart murmur   . Shortness of breath   . Seizures   . Anxiety   . Pneumonia   . LBBB (left bundle branch block)   . MI, old     Medications:  Prescriptions prior to admission  Medication Sig Dispense Refill  . albuterol (PROVENTIL HFA;VENTOLIN HFA) 108 (90 BASE) MCG/ACT inhaler Inhale 2 puffs into the lungs every 4 (four) hours as  needed.      Marland Kitchen albuterol (PROVENTIL) (2.5 MG/3ML) 0.083% nebulizer solution Take 2.5 mg by nebulization 4 (four) times daily as needed.      . budesonide-formoterol (SYMBICORT) 160-4.5 MCG/ACT inhaler Inhale 2 puffs into the lungs 2 (two) times daily.      . digoxin (LANOXIN) 0.25 MG tablet Take 0.5 tablets (125 mcg total) by mouth daily.  20 tablet  11  . FeFum-FePoly-FA-B Cmp-C-Biot (FOLIVANE-PLUS) CAPS Take 1 capsule by mouth daily.        . fish oil-omega-3 fatty acids 1000 MG capsule Take 1 g by mouth 3 (three) times daily.       . furosemide (LASIX) 40 MG tablet Take a tablet and a half (60mg ) two times a day  270 tablet  3  . metoprolol tartrate (LOPRESSOR) 12.5 mg TABS Take 0.5 tablets (12.5 mg total) by mouth 2 (two) times daily.  20 tablet  11  . nitroGLYCERIN (NITROSTAT) 0.4 MG SL tablet Place 1 tablet (0.4 mg total) under the tongue every 5 (five) minutes as needed for chest pain.  25 tablet  3  . omeprazole (PRILOSEC OTC) 20 MG tablet Take 20 mg by mouth daily.        Marland Kitchen PHENobarbital (LUMINAL) 97.2 MG tablet Take  48.6-97.2 mg by mouth 2 (two) times daily. Patient takes 1/2 tablet in the morning and 1 tablet in the evening      . phenytoin (DILANTIN) 100 MG ER capsule Take 100-200 mg by mouth 3 (three) times daily. Patient takes 1 tablet in the morning, 1 tablet at noon and 2 tablets in the evening      . potassium chloride (KLOR-CON) 10 MEQ CR tablet Take 10 mEq by mouth 2 (two) times daily.       . simvastatin (ZOCOR) 80 MG tablet Take 80 mg by mouth every other day.       . warfarin (COUMADIN) 5 MG tablet Take 7.5 mg by mouth daily.       Assessment: 77 YOM on chronic warfarin for h/o Afib. INR supratherapeutic today at 3.93, no bleeding documented.  Noted Levaquin started yesterday, which could potentiate the effect of Coumadin.  Goal of Therapy:  INR 2-3   Plan:  - Hold Coumadin today - Daily PT/INR - Continue Dilantin and phenobarbital at current doses - Monitor for  seizure activities - F/U diet advancement to change meds to PO, K+ supplementation    Phillips Climes, PharmD 03/16/2011,9:56 AM

## 2011-03-16 NOTE — Progress Notes (Signed)
Wasted 240 mL morphine drip in sink witnessed by Marlana Latus, RN  Doran Durand, Brittley Regner Deanne 03/16/2011 12:57 PM

## 2011-03-17 LAB — BASIC METABOLIC PANEL
CO2: 29 mEq/L (ref 19–32)
Calcium: 8.7 mg/dL (ref 8.4–10.5)
GFR calc Af Amer: 82 mL/min — ABNORMAL LOW (ref >90)
GFR calc non Af Amer: 71 mL/min — ABNORMAL LOW (ref >90)
Sodium: 139 mEq/L (ref 135–145)

## 2011-03-17 MED ORDER — FUROSEMIDE 40 MG PO TABS
40.0000 mg | ORAL_TABLET | Freq: Every day | ORAL | Status: DC
  Administered 2011-03-18: 40 mg via ORAL
  Filled 2011-03-17: qty 1

## 2011-03-17 NOTE — Discharge Summary (Signed)
Physician Discharge Summary  Patient ID: [name] MRN: [id number] DOB/AGE: 1933-12-02 78 y.o.  Admit date: 03/12/2011 Discharge date: 03/18/2011  Admission Diagnoses: Syncope and respiratory failure   Discharge Diagnoses:  Principal Problem:  *Acute on chronic systolic heart failure Active Problems:  CARDIOMYOPATHY, ISCHEMIC  Respiratory failure, acute  Pneumonia   Lines, Tubes, etc:  ETT 2/16 >> 2/17   Microbiology:  MRSA PCR >> NEG  Blood X 2 2/16 >> NEG   Antibiotics:  Vanc 2/16 >> 2/16  Zosyn 2/16 >> 2/16  Levofloxacin 2/18 >> 2/23 (stop date ordered)   Studies/Events:  2/16 DNR established  2/17 extubated  2/18 Do not re-intubate established  2/18 Morphine gtt started for respiratory distress  2/19 Much improved resp status. MSO4 gtt changed to PRN  2/19 Palliative Care consult requested. Transferred to PCU   Consults:  LHC Cards 2/16   Brief History  77 yowm with severe ischemic CM (EF 10%) who presented to [facility] ED via EMS on 2/16 with recurrent syncope and was intubated in ED after failing NPPV attempt for hypoxic respiratory failure. Adm dx was pulmonary edema +/- PNA. PMH also includes longstanding seizure d/o  Hospital Course:  Acute on chronic respiratory failure due to exacerbation of CHF with acute pulmonary edema +/- PNA: Clinical picture was  most suggestive of pulmonary edema but mildly elevated PCT raised concern for possibility of active infection as well. Therapeutic interventions included: Short-term intubation, supplemental oxygen, IV diuresis, and empiric antibiotic therapy. He was extubated on 2/17, DNR was established after discussion with wife and patient.  All culture data was negative. Initially started on Zosyn, then narrowed to Levaquin. Antibiotic treatment course started 2/16 and will be completed after 7 days of empiric treatment.  Plan:  - Continue diuresis at home - Cont O2  - Changed morphine to PRN only  - changed abx to PO  Levofloxacin will be completed on 2/23   End Stage Cardiomyopathy and h/o Atrial fibrillation: Spoke at length with pt's family and discussed with cardiology. At this point risks outweigh benefit in regards to coumadin therapy given poor nutritional status and difficulty predicting therapeutic endpoints. This raises a risk for hemorrhagic CVA.  Plan: - Added NTG patch for preload reduction  - Changed metoprolol and digoxin to PO  - home on asa   Seizure disorder - home on home rx.   Discharge Exam: Temp:  [96.4 F (35.8 C)] 96.4 F (35.8 C) (02/21 0500) Pulse Rate:  [75] 75  (02/21 0500) Resp:  [20] 20  (02/21 0500) BP: (102)/(65) 102/65 mmHg (02/21 0500) SpO2:  [94 %-98 %] 98 % (02/21 0841)   Gen: no distress on n/c  HEENT: WNL  Neck: JVP  Chest: bibasilar rales, no wheezes  Cardiac: irreg, + syst M  Abd: NABS, soft  Ext: No edema  Labs at discharge Lab Results  Component Value Date   CREATININE 1.01 03/17/2011   BUN 25* 03/17/2011   NA 139 03/17/2011   K 3.6 03/17/2011   CL 101 03/17/2011   CO2 29 03/17/2011   Lab Results  Component Value Date   WBC 11.3* 03/16/2011   HGB 9.8* 03/16/2011   HCT 29.8* 03/16/2011   MCV 96.1 03/16/2011   PLT 176 03/16/2011   Lab Results  Component Value Date   ALT 24 03/16/2011   AST 39* 03/16/2011   ALKPHOS 102 03/16/2011   BILITOT 0.6 03/16/2011   Lab Results  Component Value Date   INR 3.93* 03/16/2011  INR 2.96* 03/15/2011   INR 3.13* 03/14/2011    Current radiology studies No results found.  Disposition:  01-Home or Self Care  Discharge Orders    Future Appointments: Provider: Department: Dept Phone: Center:          04/14/2011 9:30 AM Elyn Aquas., MD [facility] [phone] None   05/11/2011 10:15 AM Lewayne Bunting, MD [facility] [phone] [facility]     Medication List  As of 03/18/2011  9:51 AM   STOP taking these medications         metoprolol tartrate 12.5 mg Tabs      simvastatin 80 MG  tablet      warfarin 5 MG tablet         TAKE these medications         acetaminophen 325 MG tablet   Commonly known as: TYLENOL   Take 2 tablets (650 mg total) by mouth every 6 (six) hours as needed.      albuterol (2.5 MG/3ML) 0.083% nebulizer solution   Commonly known as: PROVENTIL   Take 2.5 mg by nebulization 4 (four) times daily as needed.      albuterol 108 (90 BASE) MCG/ACT inhaler   Commonly known as: PROVENTIL HFA;VENTOLIN HFA   Inhale 2 puffs into the lungs every 4 (four) hours as needed.      aspirin 325 MG tablet   Take 1 tablet (325 mg total) by mouth daily.      budesonide-formoterol 160-4.5 MCG/ACT inhaler   Commonly known as: SYMBICORT   Inhale 2 puffs into the lungs 2 (two) times daily.      digoxin 0.25 MG tablet   Commonly known as: LANOXIN   Take 0.5 tablets (125 mcg total) by mouth daily.      fish oil-omega-3 fatty acids 1000 MG capsule   Take 1 g by mouth 3 (three) times daily.      FOLIVANE-PLUS Caps   Take 1 capsule by mouth daily.      furosemide 40 MG tablet   Commonly known as: LASIX   Take a tablet and a half (60mg ) two times a day      levofloxacin 500 MG tablet   Commonly known as: LEVAQUIN   Take 1 tablet (500 mg total) by mouth daily.      metoprolol succinate 25 MG 24 hr tablet   Commonly known as: TOPROL-XL   Take 1 tablet (25 mg total) by mouth daily.      morphine 20 MG/ML concentrated solution   Commonly known as: ROXANOL   Take 0.5 mLs (10 mg total) by mouth every 2 (two) hours as needed for pain (or shortness of breath that does not subside with nebulizer).      nitroGLYCERIN 0.4 MG SL tablet   Commonly known as: NITROSTAT   Place 1 tablet (0.4 mg total) under the tongue every 5 (five) minutes as needed for chest pain.      nitroGLYCERIN 0.3 mg/hr   Commonly known as: NITRODUR - Dosed in mg/24 hr   Place 1 patch (0.3 mg total) onto the skin daily.      omeprazole 20 MG tablet   Commonly known as: PRILOSEC OTC    Take 20 mg by mouth daily.      PHENobarbital 97.2 MG tablet   Commonly known as: LUMINAL   Take 48.6-97.2 mg by mouth 2 (two) times daily. Patient takes 1/2 tablet in the morning and 1 tablet in the evening  phenytoin 100 MG ER capsule   Commonly known as: DILANTIN   Take 100-200 mg by mouth 3 (three) times daily. Patient takes 1 tablet in the morning, 1 tablet at noon and 2 tablets in the evening      potassium chloride 10 MEQ CR tablet   Commonly known as: KLOR-CON   Take 10 mEq by mouth 2 (two) times daily.           Follow-up Information    Follow up with Josue Hector, MD .         Discharged Condition: fair  Signed: BABCOCK,PETE 03/18/2011, 9:51 AM  Pt seen and examined and database reviewed. I agree with above findings, assessment and plan  Billy Fischer, MD;  PCCM service; Mobile [phone]

## 2011-03-17 NOTE — Progress Notes (Signed)
Spoke w/ Dr [name] and again w/ wife. Think at this point risks of continuing coumadin (which he was on for AFib) outweigh the benefit of continuing it. Will place him on full strength ASA. Per wife's request will run this by Cardiology prior to discharge.   Anders Simmonds ACNP-BC [facility] Pulmonary/Critical Care Pager # [phone] OR # [phone] if no answer

## 2011-03-17 NOTE — Telephone Encounter (Signed)
Will forward to Dr Nahser for review  

## 2011-03-17 NOTE — Consult Note (Signed)
Consult Note from the Palliative Medicine Team at [facility] Patient [name]      DOB: 1933/04/08      VWU:[id number]   Consult Requested by: Dr. Benancio Deeds al.   PCP: Horald Pollen., PA, PA Reason for Consultation: GOC     Phone [phone]  Assessment and Plan:  78 yr old white male with advanced COPD, ischemic cardiomyopathy admitted with syncope and respiratory failure.  The patient was extubated but required further bipap.  The patient and the spouse have decided they would prefer to pursue a palliative course at home with Hospice care.  They live in [place].  Currently, the patient is excessively fatigued but in reasonable spirits.  They have decided to deactivate his AICD prior to discharge.  I have attempted to update Dr. Melburn Popper,  But he is detained in returning my call right now.  I have updated Anders Simmonds.  Plan would be home with hospice as soon as possible,  Deactivate AICD,  Complete po antibiotics,  And use prn roxanol 5mg  q 2 hrs prn at home if needed.  The patient's wife feels that she needs to continue his warfarin due to feelings of guilt if he were to develop a massive blood clot.  I reviewed this with Cindee Lame who will talk with her about using aspirin instead since he poses a high risk for coumadin therapy.   1. Code Status: DNR 2. Symptom Control: 1. Dyspnea:  Patient and wife comfortable with prn roxanol for home use.  Lasix, nebs and oxygen will help as well 2. Fatigue:  Patient wants to be home and sleep in his own chair.  He does not desire to sleep in a hospital bed at this time, despite the fact that it would assist in his comfort.  Family does desire a commode, over bed table, suction device   3. Ischemic cardiomyopathy:  Patient and family desire to deactivate his aicd. 3. 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  He likes  to garden, fish and hunt.  His wife states "he will be missed by a lot of people in his town". 4. Spiritual: Family has patstor's n [place] and here in town who are ministering to him. 5. Disposition: family desires home with hospice asap  Patient Documents Completed or Given: Document Given Completed  Advanced Directives Pkt    MOST    DNR    Gone from My Sight    Hard Choices      Brief HPI: 78 yr old with advanced copd, and ischemic cardiomyopathy admitted with shortness of breath found to have chf exacerbation, and possible pneumonia. Patient is completing a course of antibiotics but remains short of breath and weak related to his underlying cardiac condition.  I have been asked to assist with determination for home with hospice.   ROS:   Positive shortness of breath with intermittent chest pain, anorexia, increased secretion    PMH:  Past Medical History  Diagnosis Date  . Tobacco use disorder   . Esophageal reflux   . Unspecified cerebral artery occlusion with cerebral infarction   . Other and unspecified hyperlipidemia   . Dual implantable cardiac defibrillator in situ     Medtronic  . ischemic cardiomyopathy     MI in 1989 with subsequent CABG, bare-metal stent to circumflex 11/2010, ejection  fraction 10-15%  . Coronary artery disease     s/p stent to the distal LCX in November 2012 - on Plavix  . Chronic systolic heart failure     EF is 10 to 15%  . Sinoatrial node dysfunction   . Atrial fibrillation     on coumadin  . Stroke   . Angina   . COPD (chronic obstructive pulmonary disease)   . Anemia   . Heart murmur   . Shortness of breath   . Seizures   . Anxiety   . Pneumonia   . LBBB (left bundle branch block)   . MI, old      PSH: Past Surgical History  Procedure Date  . Coronary artery bypass graft   . Icd implant 2008    AICD 2008  . Cardiac catheterization 10/11/1996    REVEALS MILD TO MODERATE REDUCTION OF THE LV FUNCTION. THERE IS MARKED  HYPOKINESIS OF THE ANTERIOR APEX AND INFERIOR APICAL WALL  . US echocardiography 03/24/2006    EF 15-20%  . Cardiovascular stress test 06/01/2005    EF 14 %. PREVIOUS INFERIOR LATERAL MI WITH A MARKEDLY AND HYPOCONTRACTILE LV  . Coronary stent placement 11/2010    BMS to the distal LCX   I have reviewed the FH and SH and  If appropriate update it with new information. Allergies  Allergen Reactions  . Valsartan Other (See Comments)    Wife doesn't remember, possible sick to stomach   Scheduled Meds:   . aspirin  81 mg Oral Daily  . chlorhexidine  15 mL Mouth Rinse BID  . digoxin  0.125 mg Oral Daily  . furosemide  40 mg Intravenous Q8H  . furosemide  40 mg Oral BID  . levalbuterol  0.63 mg Nebulization Q6H  . levofloxacin  500 mg Oral Daily  . metoprolol succinate  25 mg Oral Daily  . nitroGLYCERIN  0.3 mg Transdermal Q24H  . PHENobarbital  64.8 mg Oral BID  . phenytoin  200 mg Oral BID  . potassium chloride  40 mEq Oral BID  . DISCONTD: antiseptic oral rinse  15 mL Mouth Rinse QID  . DISCONTD: PHENObarbital  60 mg Oral BID  . DISCONTD: phenytoin (DILANTIN) IV  200 mg Intravenous QHS  . DISCONTD: phenytoin (DILANTIN) IV  100 mg Intravenous BID WC  . DISCONTD: piperacillin-tazobactam (ZOSYN)  IV  3.375 g Intravenous Q8H   Continuous Infusions:  PRN Meds:.sodium chloride, acetaminophen, metoprolol, morphine injection, DISCONTD: acetaminophen (TYLENOL) oral liquid 160 mg/5 mL    BP 97/57  Pulse 88  Temp(Src) 98.1 F (36.7 C) (Oral)  Resp 20  Ht 5\' 8"  (1.727 m)  Wt 65.3 kg (143 lb 15.4 oz)  BMI 21.89 kg/m2  SpO2 96%   PPS:30 %  ( at best could stand and pivot to bathroom)   Intake/Output Summary (Last 24 hours) at 03/17/11 1310 Last data filed at 03/17/11 0708  Gross per 24 hour  Intake    370 ml  Output   1050 ml  Net   -680 ml   LBM: 2/20                      Physical Exam:  General: extremely fatigued, once awake he is orient to person and place but has  difficulty withtime HEENT:  PERRL, EOMI, anicteric mmm,  No significant JVD Chest:  Decreased with occasional rhonchi, no wheezing at present CVS: regular rate and rhythm Abdomen: thin soft not tender  Ext: thin, 2 plus pulses. No edema Neuro: A, A, orient x2 . Too weak for further ambulating ,. dtr 2  Labs: CBC    Component Value Date/Time   WBC 11.3* 03/16/2011 0425   RBC 3.10* 03/16/2011 0425   HGB 9.8* 03/16/2011 0425   HCT 29.8* 03/16/2011 0425   PLT 176 03/16/2011 0425   MCV 96.1 03/16/2011 0425   MCH 31.6 03/16/2011 0425   MCHC 32.9 03/16/2011 0425   RDW 14.5 03/16/2011 0425   LYMPHSABS 1.1 03/16/2011 0425   MONOABS 1.3* 03/16/2011 0425   EOSABS 0.0 03/16/2011 0425   BASOSABS 0.0 03/16/2011 0425       CMP     Component Value Date/Time   NA 139 03/17/2011 0500   K 3.6 03/17/2011 0500   CL 101 03/17/2011 0500   CO2 29 03/17/2011 0500   GLUCOSE 127* 03/17/2011 0500   BUN 25* 03/17/2011 0500   CREATININE 1.01 03/17/2011 0500   CALCIUM 8.7 03/17/2011 0500   PROT 6.6 03/16/2011 0425   ALBUMIN 2.4* 03/16/2011 0425   AST 39* 03/16/2011 0425   ALT 24 03/16/2011 0425   ALKPHOS 102 03/16/2011 0425   BILITOT 0.6 03/16/2011 0425   GFRNONAA 71* 03/17/2011 0500   GFRAA 82* 03/17/2011 0500    Chest Xray Reviewed/Impressions: Improved chf,  persistant left sided effusion and possible infiltrate      Time In Time Out Total Time Spent with Patient Total Overall Time  100 pm  150 pm 20 min 50 min    Greater than 50%  of this time was spent counseling and coordinating care related to the above assessment and plan.   Wiletta Bermingham L. [name], MD MBA The Palliative Medicine Team at [facility] Phone: [phone] Pager: [phone]

## 2011-03-17 NOTE — Telephone Encounter (Signed)
New Msg: [facility] calling wanting to know if Dr. Elease Hashimoto will sign off as pt PCP plan of care. Please return call to discuss further.

## 2011-03-17 NOTE — Progress Notes (Signed)
Pt profile: 77 yowm with severe ischemic CM (EF 10%) who presented to [facility] ED via EMS on 2/16 with recurrent syncope and was intubated in ED after failing NPPV attempt for hypoxic respiratory failure. Adm dx was pulmonary edema +/- PNA. PMH also includes longstanding seizure d/o  Lines, Tubes, etc: ETT 2/16 >> 2/17  Microbiology: MRSA PCR >> NEG Blood X 2 2/16 >> NEG  Antibiotics:  Vanc 2/16 >> 2/16 Zosyn 2/16 >> 2/16 Levofloxacin 2/18 >> 2/23 (stop date ordered)  Studies/Events: 2/16 DNR established 2/17 extubated 2/18 Do not re-intubate established 2/18 Morphine gtt started for respiratory distress 2/19 Much improved resp status. MSO4 gtt changed to PRN 2/19 Palliative Care consult requested. Transferred to PCU  Consults:  LHC Cards 2/16   Subj: Up in chair, lethargic but conversant Obj: Filed Vitals:   03/17/11 0550  BP: 97/57  Pulse: 88  Temp: 98.1 F (36.7 C)  Resp: 20    Gen: no distress on n/c HEENT: WNL Neck: JVP  Chest: bibasilar rales, no wheezes Cardiac: irreg, + syst M Abd: NABS, soft Ext:  No edema  BMET    Component Value Date/Time   NA 139 03/17/2011 0500   K 3.6 03/17/2011 0500   CL 101 03/17/2011 0500   CO2 29 03/17/2011 0500   GLUCOSE 127* 03/17/2011 0500   BUN 25* 03/17/2011 0500   CREATININE 1.01 03/17/2011 0500   CALCIUM 8.7 03/17/2011 0500   GFRNONAA 71* 03/17/2011 0500   GFRAA 82* 03/17/2011 0500    CBC    Component Value Date/Time   WBC 11.3* 03/16/2011 0425   RBC 3.10* 03/16/2011 0425   HGB 9.8* 03/16/2011 0425   HCT 29.8* 03/16/2011 0425   PLT 176 03/16/2011 0425   MCV 96.1 03/16/2011 0425   MCH 31.6 03/16/2011 0425   MCHC 32.9 03/16/2011 0425   RDW 14.5 03/16/2011 0425   LYMPHSABS 1.1 03/16/2011 0425   MONOABS 1.3* 03/16/2011 0425   EOSABS 0.0 03/16/2011 0425   BASOSABS 0.0 03/16/2011 0425     CXR: NSC bilat AS dz - edema vs infiltrates  IMPRESSION: 1) Acute on chronic respiratory failure due to exacerbation of CHF with acute  pulmonary edema +/- PNA. Clinical picture is most suggestive of pulmonary edema but mildly elevated PCT worrisome for possibility of active infection as well. Plan: - Continue diuresis to extent permitted by BP - Cont O2 - Changed morphine to PRN only hange abx to PO Levofloxacin - await palliative goals of care consult  2) End Stage Cardiomyopathy - LHC Cards following and has recommended Hospice - D/C warfarin and statin as benefit at this point in time is minimal  - Add NTG patch for preload reduction - Change metoprolol and digoxin to PO -pt considering having defibrillator turned off   3) Seizure d/o - no evidence of active seizure. Change AEDs to PO   Hope is to get him home w/ hospice.    Billy Fischer, MD;  PCCM service; Mobile [phone]

## 2011-03-17 NOTE — Progress Notes (Signed)
   CARE MANAGEMENT NOTE 03/17/2011  Patient:  [name],[name]   Account Number:  [id number]  Date Initiated:  03/17/2011  Documentation initiated by:  Natanya Holecek  Subjective/Objective Assessment:   PT WITH ENDSTAGE CARDIOMYOPATHY NEEDS HOME HOSPICE.  HE RESIDES IN [place]  WIFE BETSY TO PROVIDE CARE AT DISCHARGE (PHONE # [phone]).     Action/Plan:   MET WITH PT AND WIFE TO DISCUSS HOME HOSPICE AND OFFER CHOICE.  THEY WOULD LIKE [facility]   Anticipated DC Date:  03/18/2011   Anticipated DC Plan:  HOME W HOSPICE CARE      DC Planning Services  CM consult      PAC Choice  HOSPICE   Choice offered to / List presented to:  C-2 HC POA / Guardian           HH agency  HOSPICE   Status of service:   Medicare Important Message given?   (If response is "NO", the following Medicare IM given date fields will be blank) Date Medicare IM given:   Date Additional Medicare IM given:    Discharge Disposition:  HOME W HOSPICE CARE  Per UR Regulation:    Comments:  03/17/11 Danayah Smyre,RN,BSN 1500 DISCHARGE PLANNED FOR 03/18/11.  REFERRAL FAXED TO [facility]  (FAX # [phone]).  WIFE STATES PT WILL NEED WALKER, BSC, FULLY ELECTRIC HOSP BED, OVERBED TABLE, GEL MATTRESS, TUB SEAT WITH BACK, OXYGEN WITH HUMIDIFICATION, AND SUCTION.  [facility]  IS CONTRACTED WITH [facility], AND HOSPICE AGENCY ARRANGES THEIR OWN DME.  PT ALREADY HAS HOME O2 WITH [facility], BUT WILL NEED TO HAVE THIS PICKED UP AND REORDERED PER HOSPICE.  WIFE STATES SHE WISHES TO TRANSPORT PT HOME IN CAR TOMORROW; STATES SHE WILL BRING ONE OF PT'S PORTABLE TANKS TO HOSPITAL FOR TRANSPORT.  PER DR [name], PT DESIRES TO HAVE AICD TURNED OFF PRIOR TO DC. Phone #[phone]

## 2011-03-18 DIAGNOSIS — Z9911 Dependence on respirator [ventilator] status: Secondary | ICD-10-CM

## 2011-03-18 DIAGNOSIS — J9600 Acute respiratory failure, unspecified whether with hypoxia or hypercapnia: Secondary | ICD-10-CM

## 2011-03-18 DIAGNOSIS — I428 Other cardiomyopathies: Secondary | ICD-10-CM

## 2011-03-18 DIAGNOSIS — J810 Acute pulmonary edema: Secondary | ICD-10-CM

## 2011-03-18 MED ORDER — LEVALBUTEROL HCL 0.63 MG/3ML IN NEBU
0.6300 mg | INHALATION_SOLUTION | Freq: Four times a day (QID) | RESPIRATORY_TRACT | Status: DC
  Filled 2011-03-18 (×3): qty 3

## 2011-03-18 MED ORDER — NITROGLYCERIN 0.3 MG/HR TD PT24: 1.0000 | MEDICATED_PATCH | TRANSDERMAL | Status: AC

## 2011-03-18 MED ORDER — MORPHINE SULFATE (CONCENTRATE) 20 MG/ML PO SOLN: 10.0000 mg | ORAL | Status: AC | PRN

## 2011-03-18 MED ORDER — ACETAMINOPHEN 325 MG PO TABS: 650.0000 mg | ORAL_TABLET | Freq: Four times a day (QID) | ORAL | Status: AC | PRN

## 2011-03-18 MED ORDER — ASPIRIN 325 MG PO TABS: 325.0000 mg | ORAL_TABLET | Freq: Every day | ORAL | Status: AC

## 2011-03-18 MED ORDER — METOPROLOL SUCCINATE ER 25 MG PO TB24: 25.0000 mg | ORAL_TABLET | Freq: Every day | ORAL | Status: AC

## 2011-03-18 MED ORDER — LEVOFLOXACIN 500 MG PO TABS: 500.0000 mg | ORAL_TABLET | Freq: Every day | ORAL | Status: AC

## 2011-03-18 NOTE — Progress Notes (Signed)
PT Cancellation and Discharge Note  Treatment cancelled today due to pt discharged prior to PT evaluation.[name]  Tyyonna Soucy 03/18/2011, 12:11 PM Frankey Botting L. Corneluis Allston DPT [phone]

## 2011-03-18 NOTE — Progress Notes (Signed)
Dr. Ardeen Garland is the patient's primary care doctor.  No new recs. Patient is sleeping.  Plan - possible DC to home today with home hospice  [name], Montez Hageman., MD, [facility] 03/18/2011, 8:13 AM

## 2011-03-18 NOTE — Progress Notes (Signed)
   CARE MANAGEMENT NOTE 03/18/2011  Patient:  [name],[name]   Account Number:  [id number]  Date Initiated:  03/17/2011  Documentation initiated by:  Edmund Rick  Subjective/Objective Assessment:   PT WITH ENDSTAGE CARDIOMYOPATHY NEEDS HOME HOSPICE.  HE RESID[place] IN [place]  WIFE BETSY TO PROVIDE CARE AT DISCHARGE (PHONE # [phone]).     Action/Plan:   MET WITH PT AND WIFE TO DISCUSS HOME HOSPICE AND OFFER CHOICE.  THEY WOULD LIKE [facility]   Anticipated DC Date:  03/18/2011   Anticipated DC Plan:  HOME W HOSPICE CARE      DC Planning Services  CM consult      PAC Choice  HOSPICE   Choice offered to / List presented to:  C-2 HC POA / Guardian           HH agency  HOSPICE   Status of service:  Completed, signed off Medicare Important Message given?   (If response is "NO", the following Medicare IM given date fields will be blank) Date Medicare IM given:   Date Additional Medicare IM given:    Discharge Disposition:  HOME W HOSPICE CARE  Per UR Regulation:    Comments:  03/18/11 Michaelah Credeur,RN,BSN 1030 PT FOR DISCHARGE THIS AM.  WIFE HAS PORTABLE O2 TANK FROM HOME FOR TRANSPORT IN MOTOR VEHICLE.  SPOKE WITH MARY BETH AT [facility]  CONFIRMED THAT DR Rudi Heap AT [facility] WILLL SIGN ORDERS FOR PT WHILE UNDER HOSPICE CARE.   WIFE AWARE TO CALL MAIN HOSPICE NUMBER UPON ARRIVAL TO HOME.  03/17/11 Ica Daye,RN,BSN 1500 DISCHARGE PLANNED FOR 03/18/11.  REFERRAL FAXED TO [facility] [place]  (FAX # [phone]).  WIFE STATES PT WILL NEED WALKER, BSC, FULLY ELECTRIC HOSP BED, OVERBED TABLE, GEL MATTRESS, TUB SEAT WITH BACK, OXYGEN WITH HUMIDIFICATION, AND SUCTION.  [facility]  IS CONTRACTED WITH [facility], AND HOSPICE AGENCY ARRANGES THEIR OWN DME.  PT ALREADY HAS HOME O2 WITH [facility], BUT WILL NEED TO HAVE THIS PICKED UP AND REORDERED PER HOSPICE.  WIFE STATES SHE WISHES TO TRANSPORT PT HOME  IN CAR TOMORROW; STATES SHE WILL BRING ONE OF PT'S PORTABLE TANKS TO HOSPITAL FOR TRANSPORT.  PER DR [name], PT DESIRES TO HAVE AICD TURNED OFF PRIOR TO DC.

## 2011-03-18 NOTE — Evaluation (Signed)
Occupational Therapy Evaluation Patient Details Name: [name] MRN: [id number] DOB: [birth year] Today's Date: 03/18/2011  Problem List:  Patient Active Problem List  Diagnoses  . DYSLIPIDEMIA  . TOBACCO USER  . CARDIOMYOPATHY, ISCHEMIC  . SINUS BRADYCARDIA  . Acute on chronic systolic heart failure  . CVA  . GERD  . SEIZURE DISORDER  . Other malaise and fatigue  . AUTOMATIC IMPLANTABLE CARDIAC DEFIBRILLATOR SITU  . CAD (coronary artery disease)  . Hypotension  . Atrial fibrillation  . Encounter for long-term (current) use of anticoagulants  . Respiratory failure, acute  . Pneumonia    Past Medical History:  Past Medical History  Diagnosis Date  . Tobacco use disorder   . Esophageal reflux   . Unspecified cerebral artery occlusion with cerebral infarction   . Other and unspecified hyperlipidemia   . Dual implantable cardiac defibrillator in situ     [employer]  . ischemic cardiomyopathy     MI in 1989 with subsequent CABG, bare-metal stent to circumflex 11/2010, ejection fraction 10-15%  . Coronary artery disease     s/p stent to the distal LCX in November 2012 - on Plavix  . Chronic systolic heart failure     EF is 10 to 15%  . Sinoatrial node dysfunction   . Atrial fibrillation     on coumadin  . Stroke   . Angina   . COPD (chronic obstructive pulmonary disease)   . Anemia   . Heart murmur   . Shortness of breath   . Seizures   . Anxiety   . Pneumonia   . LBBB (left bundle branch block)   . MI, old    Past Surgical History:  Past Surgical History  Procedure Date  . Coronary artery bypass graft   . Icd implant 2008    AICD 2008  . Cardiac catheterization 10/11/1996    REVEALS MILD TO MODERATE REDUCTION OF THE LV FUNCTION. THERE IS MARKED HYPOKINESIS OF THE ANTERIOR APEX AND INFERIOR APICAL WALL  . US echocardiography 03/24/2006    EF 15-20%  . Cardiovascular stress test 06/01/2005    EF 14 %. PREVIOUS INFERIOR LATERAL MI WITH A MARKEDLY AND  HYPOCONTRACTILE LV  . Coronary stent placement 11/2010    BMS to the distal LCX    OT Assessment/Plan/Recommendation OT Assessment Clinical Impression Statement: Pt is a 78 year old man with end stage CHF who ready to return home with hospice care and wife.  Instructed in energy conservation, DME for safety and energy conservation.  Wife encourages pt to perform ADL as able and then assists when necessary.  No further OT needs.  OT Recommendation/Assessment: Patient does not need any further OT services OT Recommendation Follow Up Recommendations: No OT follow up;Supervision/Assistance - 24 hour Equipment Recommended: Tub/shower bench  OT Evaluation Precautions/Restrictions  Precautions Precautions: Fall Restrictions Weight Bearing Restrictions: No Prior Functioning Home Living Lives With: Spouse Receives Help From: Family Type of Home: House Home Layout: One level (has ramp on 3 steps inside house) Home Access: Level entry Bathroom Shower/Tub: Tub/shower unit;Curtain Firefighter: Standard Home Adaptive Equipment: Walker - rolling;Wheelchair - powered;Grab bars around toilet Prior Function Level of Independence: Independent with basic ADLs;Independent with transfers Leisure: Hobbies-yes (Comment) Comments: fishing, hunting ADL ADL Eating/Feeding: Simulated;Independent Where Assessed - Eating/Feeding: Chair Grooming: Performed;Wash/dry face Where Assessed - Grooming: Sitting, chair Upper Body Bathing: Supervision/safety;Performed Where Assessed - Upper Body Bathing: Sitting, chair Lower Body Bathing: Simulated;Supervision/safety Where Assessed - Lower Body Bathing: Sitting, chair;Sit to stand  from chair Upper Body Dressing: Performed;Set up Where Assessed - Upper Body Dressing: Sitting, chair Lower Body Dressing: Performed;Supervision/safety Where Assessed - Lower Body Dressing: Sit to stand from chair;Sitting, chair Toilet Transfer:  Simulated;Supervision/safety Toilet Transfer Method: Stand pivot ADL Comments: Pt fatigues easily, requiring extra time.  Wife assists with LB ADL when pt gets tired.  Pt instructed to cross foot over opposite knee to conserve his energy.  Recommended tub bench and hand held shower head for home--gave handout with pictures so wife may request from hospice. Vision/Perception  Vision - History Patient Visual Report: No change from baseline Cognition Cognition Arousal/Alertness: Awake/alert Overall Cognitive Status: Appears within functional limits for tasks assessed Orientation Level: Oriented X4 Sensation/Coordination Coordination Fine Motor Movements are Fluid and Coordinated: Yes Extremity Assessment RUE Assessment RUE Assessment: Within Functional Limits LUE Assessment LUE Assessment: Within Functional Limits Mobility  Bed Mobility Bed Mobility: No Exercises   End of Session OT - End of Session Equipment Utilized During Treatment:  (02) Activity Tolerance: Patient limited by fatigue Patient left: in chair;with call bell in reach;with family/visitor present General Behavior During Session: [facility] for tasks performed Cognition: [facility] for tasks performed   Evern Bio 03/18/2011, 10:51 AM  [phone]

## 2011-03-18 NOTE — Progress Notes (Signed)
1100 Pt d/c'd to home with wife. Out in w/c.  Portable O2 with pt.  Alert.  Resp unlabored.  No acute distress noted. Pt going home with home hospice from [facility].  Wife in contact with hospice before leaving.  D/C instructions and rx given to wife.   Ebony Hail RN 03/18/11

## 2011-03-18 NOTE — Telephone Encounter (Signed)
Per Dr. Elease Hashimoto, Dr. Joette Catching is Mr. Eliot Ford PCP, he should arrange hospice care.

## 2011-03-19 LAB — CULTURE, BLOOD (ROUTINE X 2)
Culture  Setup Time: 201302161130
Culture  Setup Time: 201302161130
Culture: NO GROWTH
Culture: NO GROWTH

## 2011-04-08 ENCOUNTER — Telehealth: Payer: Self-pay | Admitting: Internal Medicine

## 2011-04-08 NOTE — Telephone Encounter (Signed)
Discussed with Belenda Cruise in device clinic and she will contact pt

## 2011-04-08 NOTE — Telephone Encounter (Signed)
Please return call to patient wife Tamela Oddi [phone]  Patient in hospice, defib was turned off last time he was in the hospital, but pacer is making a buzzing sound.  Please return call to [facility] to advise, she can be reached at [phone]

## 2011-04-08 NOTE — Telephone Encounter (Signed)
Pt scheduled for device check on 04-14-11 @ 1000 to see if device is functioning normally.

## 2011-04-12 ENCOUNTER — Ambulatory Visit: Payer: 59 | Admitting: Cardiovascular Disease

## 2011-04-14 ENCOUNTER — Ambulatory Visit (INDEPENDENT_AMBULATORY_CARE_PROVIDER_SITE_OTHER): Payer: Medicare Other | Admitting: *Deleted

## 2011-04-14 ENCOUNTER — Ambulatory Visit: Payer: 59 | Admitting: Cardiovascular Disease

## 2011-04-14 DIAGNOSIS — I428 Other cardiomyopathies: Secondary | ICD-10-CM

## 2011-05-11 ENCOUNTER — Encounter: Payer: Medicare PPO | Admitting: Internal Medicine

## 2011-06-29 ENCOUNTER — Ambulatory Visit: Payer: Self-pay | Admitting: Cardiovascular Disease

## 2011-06-29 DIAGNOSIS — I4891 Unspecified atrial fibrillation: Secondary | ICD-10-CM

## 2011-06-29 DIAGNOSIS — Z7901 Long term (current) use of anticoagulants: Secondary | ICD-10-CM

## 2011-07-26 DEATH — deceased

## 2013-07-26 ENCOUNTER — Encounter: Payer: Self-pay | Admitting: *Deleted

## 2013-09-17 ENCOUNTER — Encounter: Payer: Self-pay | Admitting: Cardiovascular Disease

## 2013-10-05 IMAGING — CR DG CHEST 2V
2 series · 2 of 2 positions shown · non-contrast
Comparison: 11th 17-2424 and 10/31/2006

CLINICAL DATA: Shortness of breath.  Congestive heart failure.

CHEST - 2 VIEW

[w chest pa]
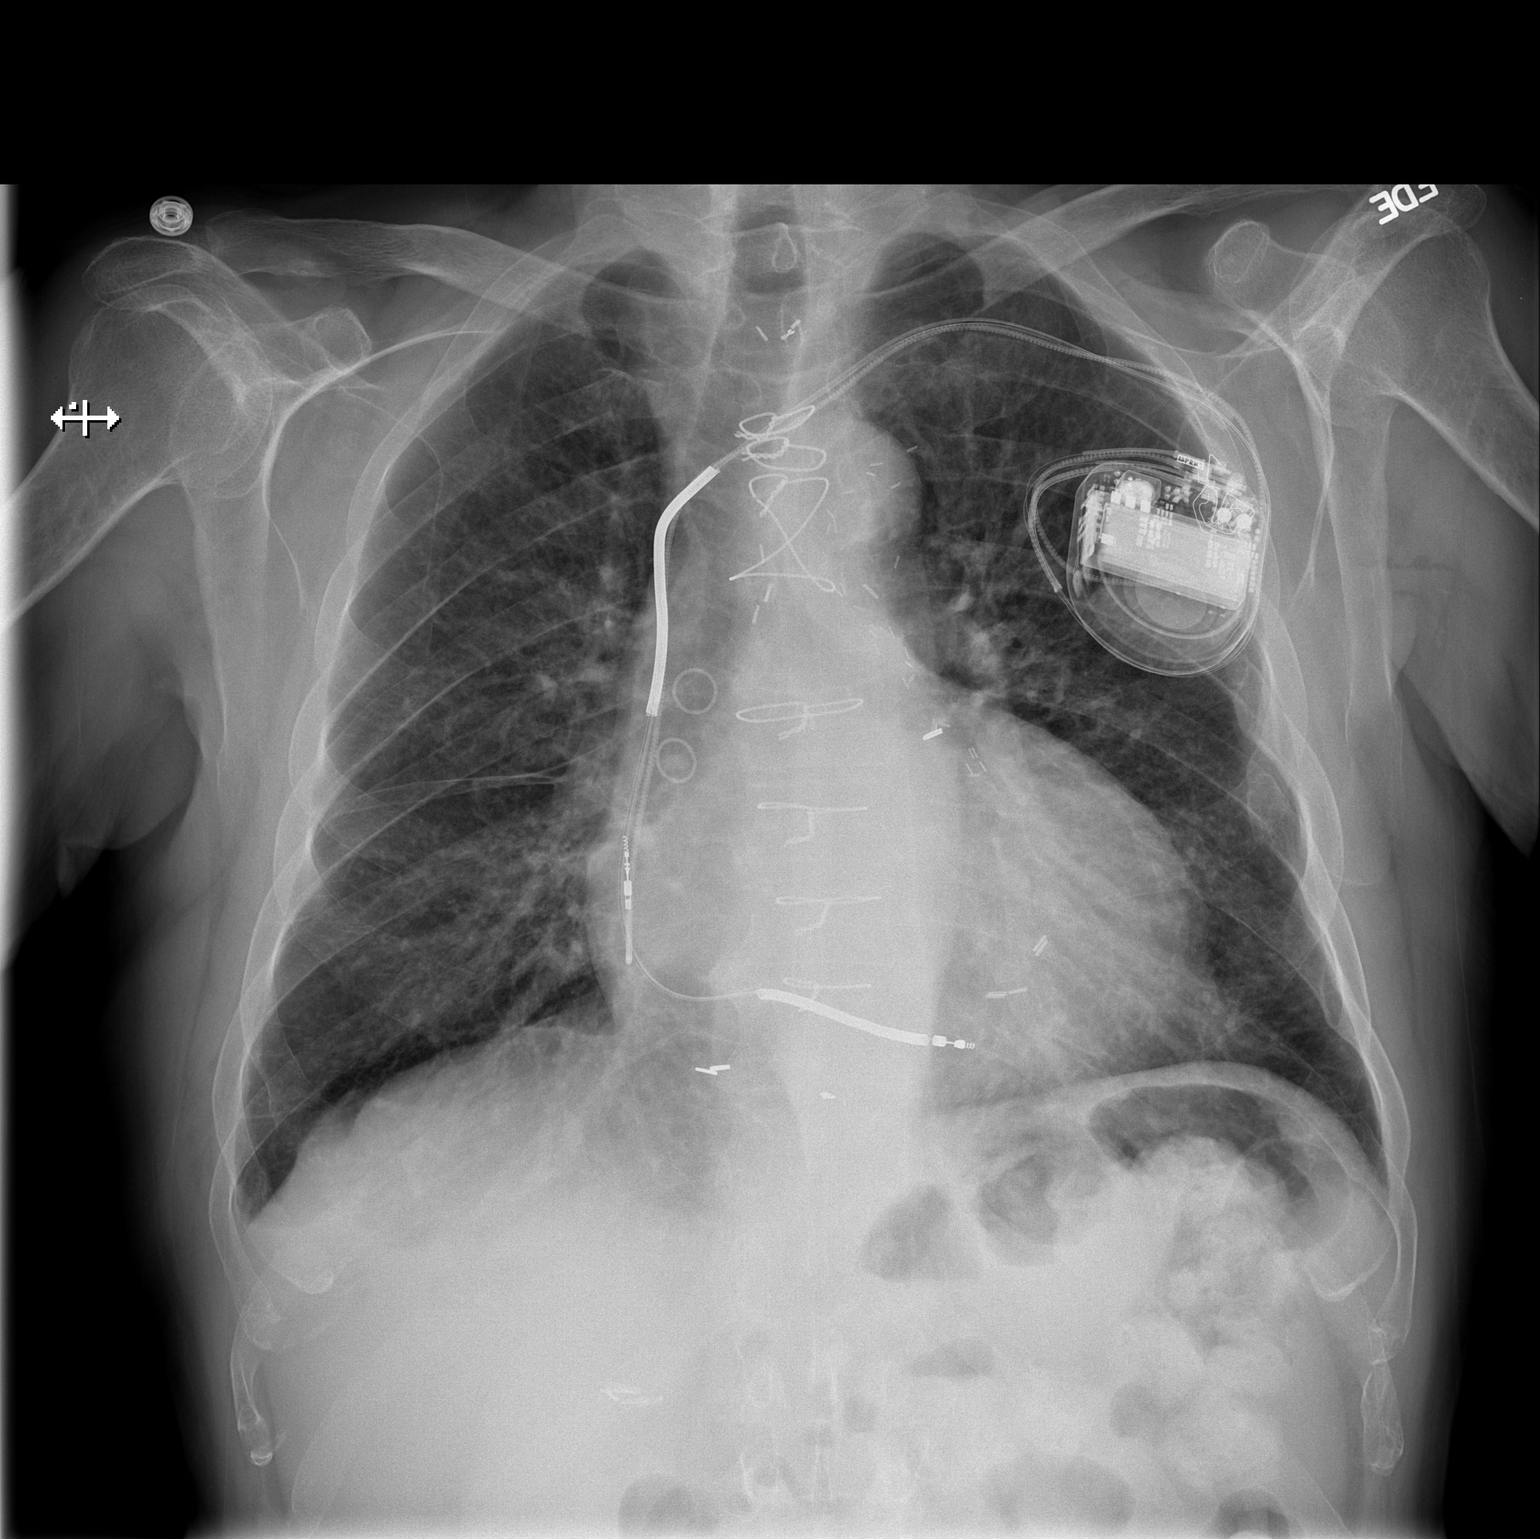

[w chest lat]
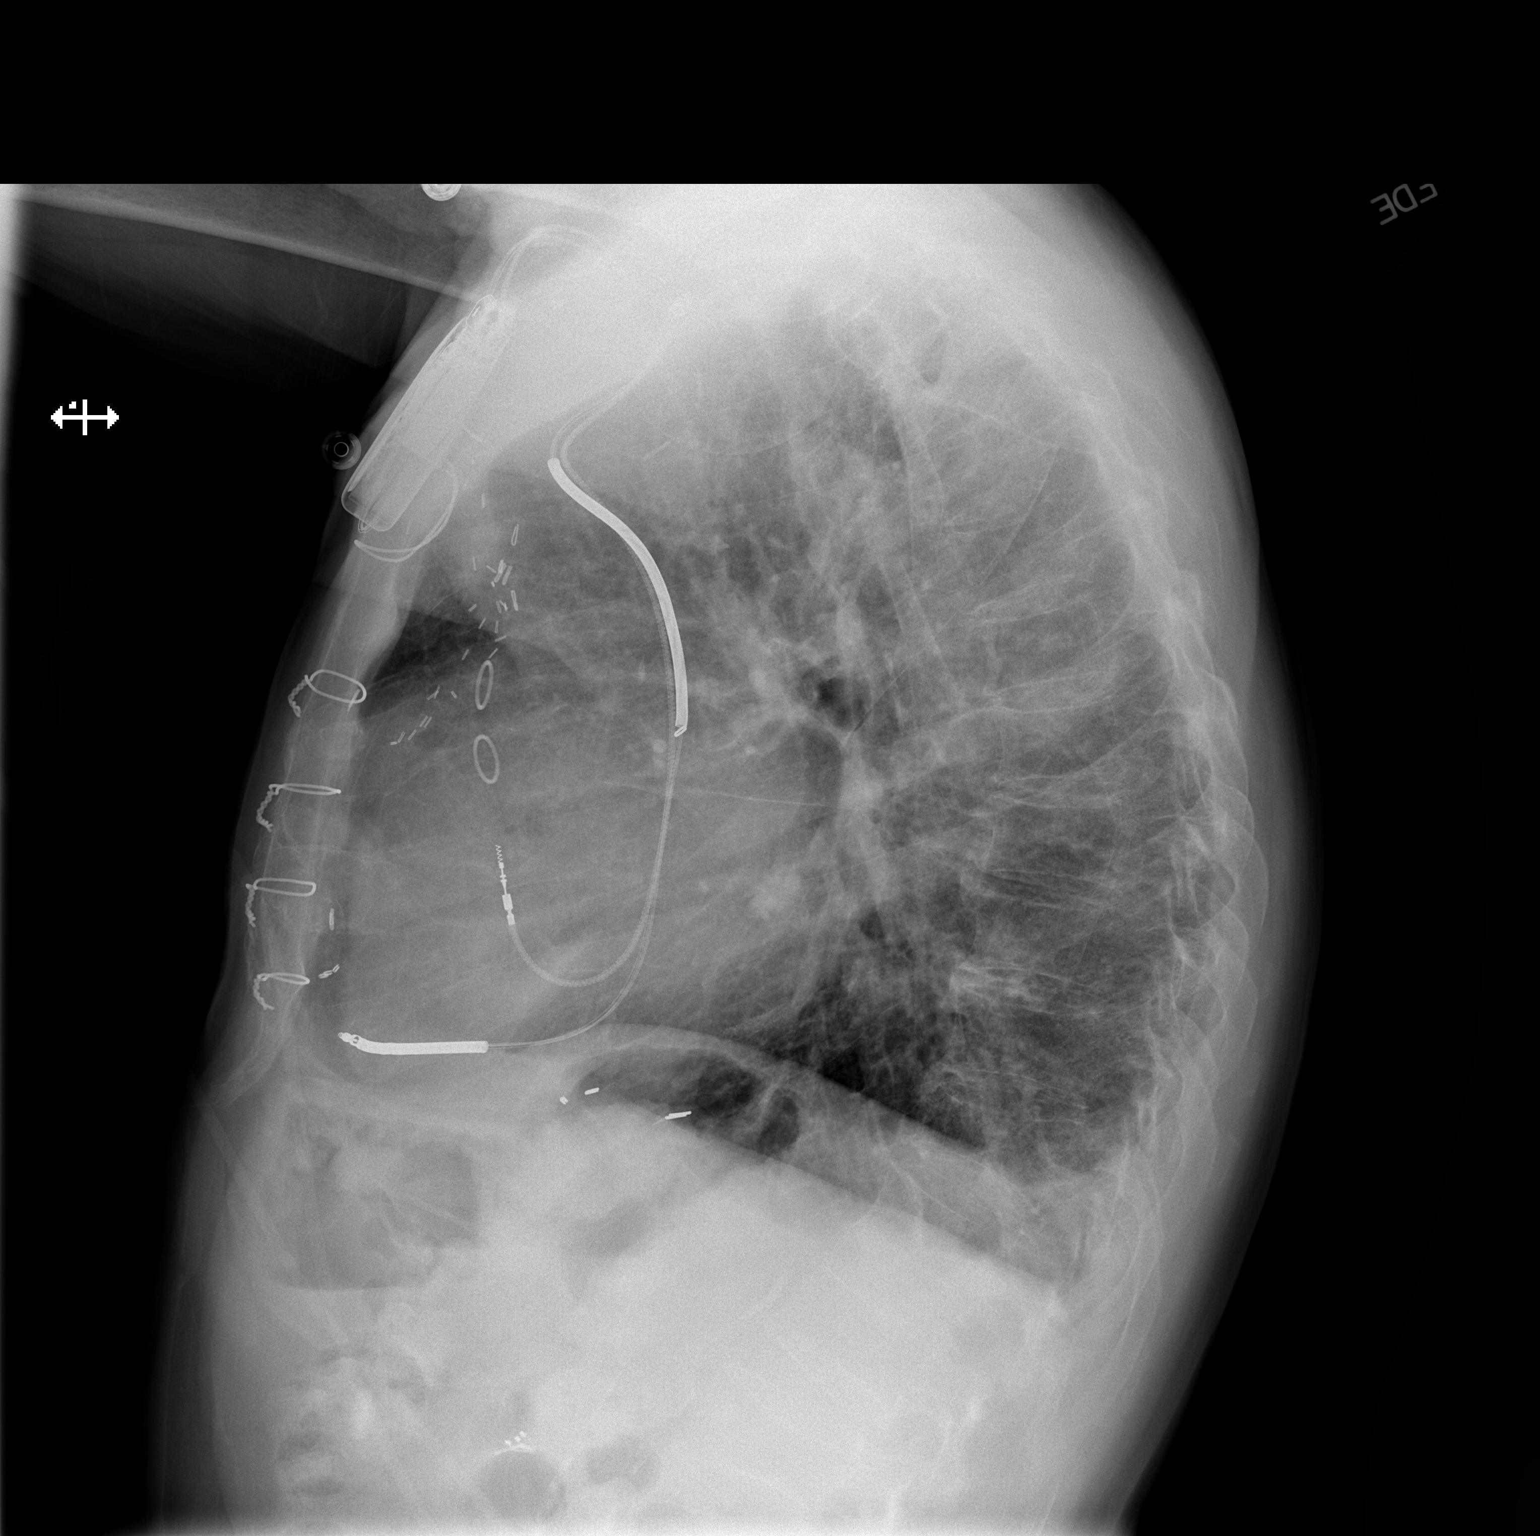

[2 of 2 positions shown; findings below may reference images not displayed]

FINDINGS: Interstitial pulmonary edema has almost completely
resolved with slight residual Kerley B lines at the left base.
Heart size and vascularity are normal.  Tiny residual effusions.

No acute osseous abnormality.  Old left rib fractures.  AICD in
place.  Prior CABG.
IMPRESSION: Almost complete resolution of the pulmonary edema.  Tiny residual
effusions.

## 2013-11-20 IMAGING — CR DG CHEST 1V PORT
1 series · 1 of 1 positions shown · non-contrast
Comparison: 12/14/2010.

CLINICAL DATA: Shortness of breath.

PORTABLE CHEST - 1 VIEW

[AP]
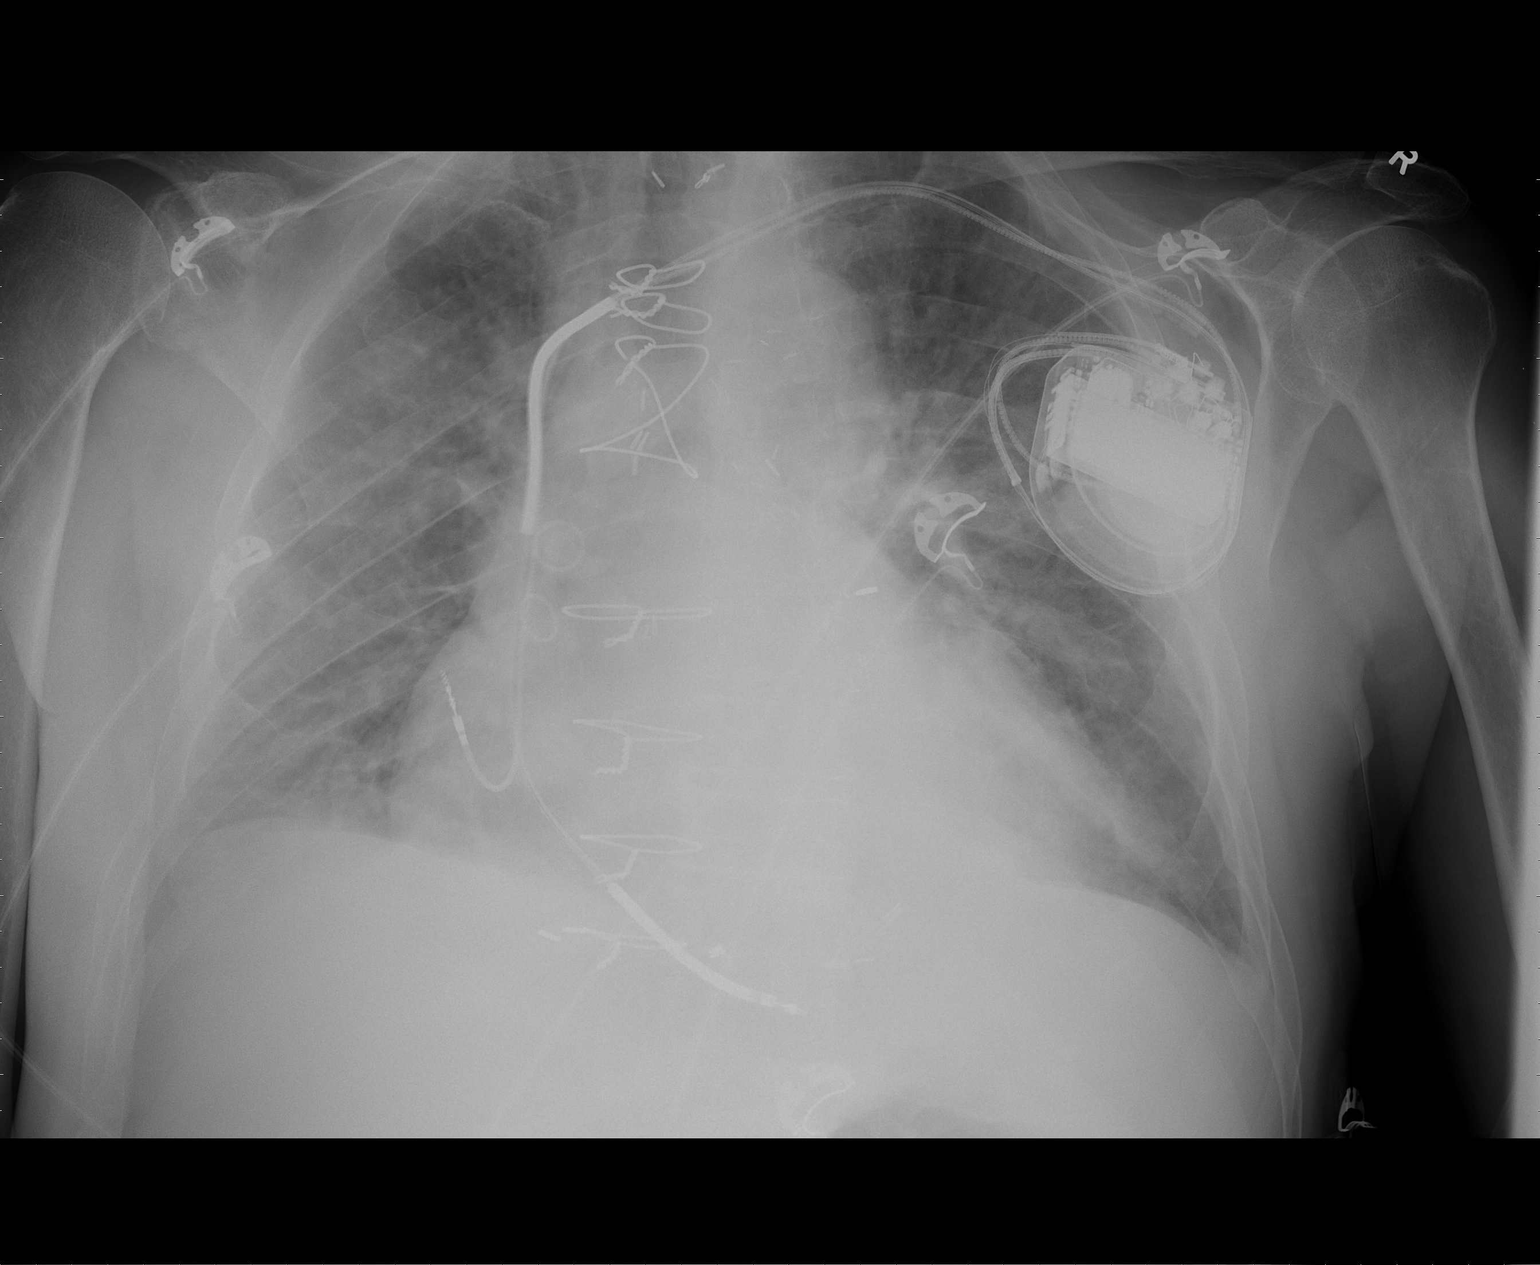

[1 of 1 positions shown; findings below may reference images not displayed]

FINDINGS: Enlarged cardiac silhouette with an interval increase in
size.  Interval mild diffuse bilateral airspace opacity.  More
pronounced airspace opacity in the medial left lower lobe.  Stable
left subclavian pacer and AICD leads.  Stable post CABG changes.
Diffuse osteopenia.
IMPRESSION: 1.  Progressive cardiomegaly with interval changes of congestive
heart failure.
2.  Left lower lobe atelectasis or pneumonia.

## 2014-01-03 ENCOUNTER — Encounter (HOSPITAL_COMMUNITY): Payer: Self-pay | Admitting: Cardiovascular Disease
